# Patient Record
Sex: Female | Born: 1935 | Race: White | Hispanic: No | Smoking: Former smoker
Health system: Southern US, Community
[De-identification: ages and names within clinical notes are randomized; demographics above are authoritative.]

## PROBLEM LIST (undated history)

## (undated) DIAGNOSIS — I739 Peripheral vascular disease, unspecified: Secondary | ICD-10-CM

## (undated) DIAGNOSIS — G459 Transient cerebral ischemic attack, unspecified: Secondary | ICD-10-CM

## (undated) DIAGNOSIS — E039 Hypothyroidism, unspecified: Secondary | ICD-10-CM

## (undated) DIAGNOSIS — M1990 Unspecified osteoarthritis, unspecified site: Secondary | ICD-10-CM

## (undated) DIAGNOSIS — F32A Depression, unspecified: Secondary | ICD-10-CM

## (undated) DIAGNOSIS — F329 Major depressive disorder, single episode, unspecified: Secondary | ICD-10-CM

## (undated) DIAGNOSIS — F0390 Unspecified dementia without behavioral disturbance: Secondary | ICD-10-CM

## (undated) DIAGNOSIS — E46 Unspecified protein-calorie malnutrition: Secondary | ICD-10-CM

## (undated) DIAGNOSIS — N289 Disorder of kidney and ureter, unspecified: Secondary | ICD-10-CM

## (undated) DIAGNOSIS — I639 Cerebral infarction, unspecified: Secondary | ICD-10-CM

## (undated) DIAGNOSIS — D649 Anemia, unspecified: Secondary | ICD-10-CM

## (undated) DIAGNOSIS — E871 Hypo-osmolality and hyponatremia: Secondary | ICD-10-CM

## (undated) DIAGNOSIS — N390 Urinary tract infection, site not specified: Secondary | ICD-10-CM

## (undated) DIAGNOSIS — I10 Essential (primary) hypertension: Secondary | ICD-10-CM

## (undated) DIAGNOSIS — L308 Other specified dermatitis: Secondary | ICD-10-CM

## (undated) DIAGNOSIS — I509 Heart failure, unspecified: Secondary | ICD-10-CM

## (undated) DIAGNOSIS — A419 Sepsis, unspecified organism: Secondary | ICD-10-CM

## (undated) DIAGNOSIS — I4891 Unspecified atrial fibrillation: Secondary | ICD-10-CM

## (undated) DIAGNOSIS — A0472 Enterocolitis due to Clostridium difficile, not specified as recurrent: Secondary | ICD-10-CM

## (undated) DIAGNOSIS — F419 Anxiety disorder, unspecified: Secondary | ICD-10-CM

## (undated) HISTORY — DX: Major depressive disorder, single episode, unspecified: F32.9

## (undated) HISTORY — DX: Peripheral vascular disease, unspecified: I73.9

## (undated) HISTORY — DX: Anxiety disorder, unspecified: F41.9

## (undated) HISTORY — DX: Unspecified protein-calorie malnutrition: E46

## (undated) HISTORY — DX: Anemia, unspecified: D64.9

## (undated) HISTORY — DX: Enterocolitis due to Clostridium difficile, not specified as recurrent: A04.72

## (undated) HISTORY — DX: Urinary tract infection, site not specified: N39.0

## (undated) HISTORY — DX: Heart failure, unspecified: I50.9

## (undated) HISTORY — DX: Sepsis, unspecified organism: A41.9

## (undated) HISTORY — DX: Hypothyroidism, unspecified: E03.9

## (undated) HISTORY — DX: Unspecified osteoarthritis, unspecified site: M19.90

## (undated) HISTORY — PX: BLADDER SURGERY: SHX569

## (undated) HISTORY — DX: Transient cerebral ischemic attack, unspecified: G45.9

## (undated) HISTORY — DX: Depression, unspecified: F32.A

## (undated) HISTORY — PX: CATARACT EXTRACTION: SUR2

## (undated) HISTORY — DX: Hypo-osmolality and hyponatremia: E87.1

## (undated) HISTORY — DX: Other specified dermatitis: L30.8

## (undated) HISTORY — PX: INGUINAL HERNIA REPAIR: SUR1180

## (undated) HISTORY — DX: Cerebral infarction, unspecified: I63.9

## (undated) HISTORY — DX: Unspecified dementia, unspecified severity, without behavioral disturbance, psychotic disturbance, mood disturbance, and anxiety: F03.90

---

## 1998-01-23 ENCOUNTER — Encounter: Admission: RE | Admit: 1998-01-23 | Discharge: 1998-01-23 | Payer: Self-pay | Admitting: Internal Medicine

## 1998-02-18 ENCOUNTER — Encounter: Admission: RE | Admit: 1998-02-18 | Discharge: 1998-02-18 | Payer: Self-pay | Admitting: Hematology and Oncology

## 1998-03-26 ENCOUNTER — Encounter: Admission: RE | Admit: 1998-03-26 | Discharge: 1998-03-26 | Payer: Self-pay | Admitting: Hematology and Oncology

## 1998-04-08 ENCOUNTER — Encounter: Admission: RE | Admit: 1998-04-08 | Discharge: 1998-04-08 | Payer: Self-pay | Admitting: Hematology and Oncology

## 1998-06-10 ENCOUNTER — Encounter: Admission: RE | Admit: 1998-06-10 | Discharge: 1998-06-10 | Payer: Self-pay | Admitting: Internal Medicine

## 1998-07-14 ENCOUNTER — Encounter: Admission: RE | Admit: 1998-07-14 | Discharge: 1998-07-14 | Payer: Self-pay | Admitting: Internal Medicine

## 1998-08-10 ENCOUNTER — Encounter: Admission: RE | Admit: 1998-08-10 | Discharge: 1998-08-10 | Payer: Self-pay | Admitting: Internal Medicine

## 1998-09-01 ENCOUNTER — Ambulatory Visit: Admission: RE | Admit: 1998-09-01 | Discharge: 1998-09-01 | Payer: Self-pay

## 1998-09-18 ENCOUNTER — Encounter: Admission: RE | Admit: 1998-09-18 | Discharge: 1998-09-18 | Payer: Self-pay | Admitting: Internal Medicine

## 1998-10-08 ENCOUNTER — Encounter: Admission: RE | Admit: 1998-10-08 | Discharge: 1998-10-08 | Payer: Self-pay | Admitting: Internal Medicine

## 1998-12-01 ENCOUNTER — Encounter: Admission: RE | Admit: 1998-12-01 | Discharge: 1998-12-01 | Payer: Self-pay | Admitting: Internal Medicine

## 1998-12-29 ENCOUNTER — Encounter: Admission: RE | Admit: 1998-12-29 | Discharge: 1998-12-29 | Payer: Self-pay | Admitting: Internal Medicine

## 1999-01-26 ENCOUNTER — Encounter: Admission: RE | Admit: 1999-01-26 | Discharge: 1999-01-26 | Payer: Self-pay | Admitting: Internal Medicine

## 1999-03-16 ENCOUNTER — Encounter: Admission: RE | Admit: 1999-03-16 | Discharge: 1999-03-16 | Payer: Self-pay | Admitting: Internal Medicine

## 1999-04-01 ENCOUNTER — Encounter: Admission: RE | Admit: 1999-04-01 | Discharge: 1999-04-01 | Payer: Self-pay | Admitting: Internal Medicine

## 1999-06-14 ENCOUNTER — Encounter: Admission: RE | Admit: 1999-06-14 | Discharge: 1999-06-14 | Payer: Self-pay | Admitting: Internal Medicine

## 1999-07-12 ENCOUNTER — Encounter: Admission: RE | Admit: 1999-07-12 | Discharge: 1999-07-12 | Payer: Self-pay | Admitting: Internal Medicine

## 1999-08-16 ENCOUNTER — Encounter: Admission: RE | Admit: 1999-08-16 | Discharge: 1999-08-16 | Payer: Self-pay | Admitting: Internal Medicine

## 1999-09-13 ENCOUNTER — Encounter: Admission: RE | Admit: 1999-09-13 | Discharge: 1999-09-13 | Payer: Self-pay | Admitting: Internal Medicine

## 1999-11-01 ENCOUNTER — Encounter: Admission: RE | Admit: 1999-11-01 | Discharge: 1999-11-01 | Payer: Self-pay | Admitting: Internal Medicine

## 1999-12-08 ENCOUNTER — Encounter: Payer: Self-pay | Admitting: Internal Medicine

## 1999-12-08 ENCOUNTER — Encounter: Admission: RE | Admit: 1999-12-08 | Discharge: 1999-12-08 | Payer: Self-pay

## 1999-12-21 ENCOUNTER — Encounter: Admission: RE | Admit: 1999-12-21 | Discharge: 1999-12-21 | Payer: Self-pay | Admitting: Hematology and Oncology

## 2000-05-08 ENCOUNTER — Encounter: Admission: RE | Admit: 2000-05-08 | Discharge: 2000-05-08 | Payer: Self-pay | Admitting: Internal Medicine

## 2000-06-26 ENCOUNTER — Encounter: Admission: RE | Admit: 2000-06-26 | Discharge: 2000-06-26 | Payer: Self-pay | Admitting: Internal Medicine

## 2000-07-11 ENCOUNTER — Encounter: Admission: RE | Admit: 2000-07-11 | Discharge: 2000-07-11 | Payer: Self-pay | Admitting: Obstetrics & Gynecology

## 2000-07-11 ENCOUNTER — Encounter: Admission: RE | Admit: 2000-07-11 | Discharge: 2000-07-11 | Payer: Self-pay | Admitting: Internal Medicine

## 2000-08-21 ENCOUNTER — Encounter: Admission: RE | Admit: 2000-08-21 | Discharge: 2000-08-21 | Payer: Self-pay | Admitting: Internal Medicine

## 2000-11-20 ENCOUNTER — Encounter: Admission: RE | Admit: 2000-11-20 | Discharge: 2000-11-20 | Payer: Self-pay

## 2001-01-01 ENCOUNTER — Encounter: Admission: RE | Admit: 2001-01-01 | Discharge: 2001-01-01 | Payer: Self-pay

## 2001-03-13 ENCOUNTER — Encounter: Admission: RE | Admit: 2001-03-13 | Discharge: 2001-03-13 | Payer: Self-pay | Admitting: Internal Medicine

## 2001-06-07 ENCOUNTER — Encounter: Admission: RE | Admit: 2001-06-07 | Discharge: 2001-06-07 | Payer: Self-pay | Admitting: Internal Medicine

## 2001-07-20 ENCOUNTER — Emergency Department (HOSPITAL_COMMUNITY): Admission: EM | Admit: 2001-07-20 | Discharge: 2001-07-20 | Payer: Self-pay | Admitting: Emergency Medicine

## 2001-07-20 ENCOUNTER — Encounter: Payer: Self-pay | Admitting: Emergency Medicine

## 2001-07-23 ENCOUNTER — Encounter: Admission: RE | Admit: 2001-07-23 | Discharge: 2001-07-23 | Payer: Self-pay

## 2001-08-01 ENCOUNTER — Ambulatory Visit (HOSPITAL_COMMUNITY): Admission: RE | Admit: 2001-08-01 | Discharge: 2001-08-01 | Payer: Self-pay

## 2001-08-30 ENCOUNTER — Encounter: Admission: RE | Admit: 2001-08-30 | Discharge: 2001-08-30 | Payer: Self-pay | Admitting: Internal Medicine

## 2002-02-07 ENCOUNTER — Encounter: Admission: RE | Admit: 2002-02-07 | Discharge: 2002-02-07 | Payer: Self-pay | Admitting: Internal Medicine

## 2002-05-01 ENCOUNTER — Encounter: Admission: RE | Admit: 2002-05-01 | Discharge: 2002-05-01 | Payer: Self-pay | Admitting: Internal Medicine

## 2002-07-24 ENCOUNTER — Encounter: Admission: RE | Admit: 2002-07-24 | Discharge: 2002-07-24 | Payer: Self-pay | Admitting: Internal Medicine

## 2002-08-14 ENCOUNTER — Encounter: Admission: RE | Admit: 2002-08-14 | Discharge: 2002-08-14 | Payer: Self-pay | Admitting: Internal Medicine

## 2002-09-06 ENCOUNTER — Encounter: Admission: RE | Admit: 2002-09-06 | Discharge: 2002-09-06 | Payer: Self-pay | Admitting: Internal Medicine

## 2002-09-23 ENCOUNTER — Encounter: Admission: RE | Admit: 2002-09-23 | Discharge: 2002-09-23 | Payer: Self-pay | Admitting: Internal Medicine

## 2002-09-23 ENCOUNTER — Encounter: Payer: Self-pay | Admitting: Internal Medicine

## 2002-09-23 ENCOUNTER — Ambulatory Visit (HOSPITAL_COMMUNITY): Admission: RE | Admit: 2002-09-23 | Discharge: 2002-09-23 | Payer: Self-pay | Admitting: Internal Medicine

## 2002-11-21 ENCOUNTER — Encounter: Admission: RE | Admit: 2002-11-21 | Discharge: 2002-11-21 | Payer: Self-pay | Admitting: Internal Medicine

## 2003-01-20 ENCOUNTER — Encounter: Admission: RE | Admit: 2003-01-20 | Discharge: 2003-01-20 | Payer: Self-pay | Admitting: Internal Medicine

## 2003-02-18 ENCOUNTER — Encounter: Admission: RE | Admit: 2003-02-18 | Discharge: 2003-02-18 | Payer: Self-pay | Admitting: Internal Medicine

## 2003-05-19 ENCOUNTER — Encounter: Admission: RE | Admit: 2003-05-19 | Discharge: 2003-05-19 | Payer: Self-pay | Admitting: Internal Medicine

## 2003-08-28 ENCOUNTER — Encounter: Admission: RE | Admit: 2003-08-28 | Discharge: 2003-08-28 | Payer: Self-pay | Admitting: Internal Medicine

## 2003-11-24 ENCOUNTER — Encounter: Admission: RE | Admit: 2003-11-24 | Discharge: 2003-11-24 | Payer: Self-pay | Admitting: Internal Medicine

## 2003-12-12 ENCOUNTER — Encounter: Admission: RE | Admit: 2003-12-12 | Discharge: 2003-12-12 | Payer: Self-pay | Admitting: Internal Medicine

## 2003-12-22 ENCOUNTER — Encounter: Admission: RE | Admit: 2003-12-22 | Discharge: 2003-12-22 | Payer: Self-pay | Admitting: Internal Medicine

## 2004-01-05 ENCOUNTER — Encounter: Admission: RE | Admit: 2004-01-05 | Discharge: 2004-01-05 | Payer: Self-pay | Admitting: Internal Medicine

## 2004-02-09 ENCOUNTER — Encounter: Admission: RE | Admit: 2004-02-09 | Discharge: 2004-02-09 | Payer: Self-pay | Admitting: Internal Medicine

## 2004-03-19 ENCOUNTER — Ambulatory Visit (HOSPITAL_COMMUNITY): Admission: RE | Admit: 2004-03-19 | Discharge: 2004-03-19 | Payer: Self-pay | Admitting: Internal Medicine

## 2004-03-22 ENCOUNTER — Encounter: Admission: RE | Admit: 2004-03-22 | Discharge: 2004-03-22 | Payer: Self-pay | Admitting: Internal Medicine

## 2004-03-23 ENCOUNTER — Encounter: Admission: RE | Admit: 2004-03-23 | Discharge: 2004-03-23 | Payer: Self-pay | Admitting: Internal Medicine

## 2004-04-22 ENCOUNTER — Encounter: Admission: RE | Admit: 2004-04-22 | Discharge: 2004-04-22 | Payer: Self-pay | Admitting: Internal Medicine

## 2004-05-10 ENCOUNTER — Encounter: Admission: RE | Admit: 2004-05-10 | Discharge: 2004-05-10 | Payer: Self-pay | Admitting: Internal Medicine

## 2004-05-17 ENCOUNTER — Encounter: Admission: RE | Admit: 2004-05-17 | Discharge: 2004-05-17 | Payer: Self-pay | Admitting: Internal Medicine

## 2004-05-31 ENCOUNTER — Encounter: Admission: RE | Admit: 2004-05-31 | Discharge: 2004-05-31 | Payer: Self-pay | Admitting: Internal Medicine

## 2004-06-28 ENCOUNTER — Ambulatory Visit: Payer: Self-pay | Admitting: Internal Medicine

## 2004-07-26 ENCOUNTER — Ambulatory Visit: Payer: Self-pay | Admitting: Internal Medicine

## 2004-08-23 ENCOUNTER — Ambulatory Visit: Payer: Self-pay | Admitting: Internal Medicine

## 2004-08-31 ENCOUNTER — Ambulatory Visit: Payer: Self-pay | Admitting: Internal Medicine

## 2004-09-27 ENCOUNTER — Ambulatory Visit: Payer: Self-pay | Admitting: Internal Medicine

## 2004-10-25 ENCOUNTER — Ambulatory Visit: Payer: Self-pay | Admitting: Internal Medicine

## 2004-11-22 ENCOUNTER — Ambulatory Visit: Payer: Self-pay | Admitting: Internal Medicine

## 2004-12-20 ENCOUNTER — Ambulatory Visit: Payer: Self-pay | Admitting: Internal Medicine

## 2005-01-13 ENCOUNTER — Ambulatory Visit: Payer: Self-pay | Admitting: Internal Medicine

## 2005-02-02 ENCOUNTER — Ambulatory Visit: Payer: Self-pay | Admitting: Internal Medicine

## 2005-02-21 ENCOUNTER — Ambulatory Visit: Payer: Self-pay | Admitting: Internal Medicine

## 2005-03-21 ENCOUNTER — Ambulatory Visit: Payer: Self-pay | Admitting: Internal Medicine

## 2005-04-25 ENCOUNTER — Ambulatory Visit: Payer: Self-pay | Admitting: Internal Medicine

## 2005-05-23 ENCOUNTER — Ambulatory Visit (HOSPITAL_COMMUNITY): Admission: RE | Admit: 2005-05-23 | Discharge: 2005-05-23 | Payer: Self-pay | Admitting: Internal Medicine

## 2005-05-23 ENCOUNTER — Ambulatory Visit: Payer: Self-pay | Admitting: Internal Medicine

## 2005-05-31 ENCOUNTER — Ambulatory Visit: Payer: Self-pay | Admitting: Internal Medicine

## 2005-06-03 ENCOUNTER — Ambulatory Visit (HOSPITAL_COMMUNITY): Admission: RE | Admit: 2005-06-03 | Discharge: 2005-06-03 | Payer: Self-pay | Admitting: Internal Medicine

## 2005-06-27 ENCOUNTER — Ambulatory Visit: Payer: Self-pay | Admitting: Internal Medicine

## 2005-07-11 ENCOUNTER — Ambulatory Visit: Payer: Self-pay | Admitting: Internal Medicine

## 2005-07-15 ENCOUNTER — Ambulatory Visit: Payer: Self-pay | Admitting: Internal Medicine

## 2005-07-25 ENCOUNTER — Ambulatory Visit: Payer: Self-pay | Admitting: Hospitalist

## 2005-08-22 ENCOUNTER — Ambulatory Visit: Payer: Self-pay | Admitting: Internal Medicine

## 2005-09-07 ENCOUNTER — Ambulatory Visit: Payer: Self-pay | Admitting: Internal Medicine

## 2005-10-03 ENCOUNTER — Ambulatory Visit: Payer: Self-pay | Admitting: Internal Medicine

## 2005-10-31 ENCOUNTER — Ambulatory Visit: Payer: Self-pay | Admitting: Hospitalist

## 2005-11-28 ENCOUNTER — Ambulatory Visit: Payer: Self-pay | Admitting: Internal Medicine

## 2005-12-12 ENCOUNTER — Ambulatory Visit: Payer: Self-pay | Admitting: Internal Medicine

## 2005-12-19 ENCOUNTER — Ambulatory Visit: Payer: Self-pay | Admitting: Internal Medicine

## 2006-01-09 ENCOUNTER — Ambulatory Visit: Payer: Self-pay | Admitting: Internal Medicine

## 2006-02-08 ENCOUNTER — Ambulatory Visit: Payer: Self-pay | Admitting: Internal Medicine

## 2006-02-13 ENCOUNTER — Ambulatory Visit: Payer: Self-pay | Admitting: Hospitalist

## 2006-03-16 ENCOUNTER — Ambulatory Visit: Payer: Self-pay | Admitting: Internal Medicine

## 2006-04-17 ENCOUNTER — Ambulatory Visit: Payer: Self-pay | Admitting: Internal Medicine

## 2006-04-28 ENCOUNTER — Ambulatory Visit: Payer: Self-pay | Admitting: Internal Medicine

## 2006-05-15 ENCOUNTER — Ambulatory Visit: Payer: Self-pay | Admitting: Internal Medicine

## 2006-06-12 ENCOUNTER — Ambulatory Visit: Payer: Self-pay | Admitting: Hospitalist

## 2006-07-10 ENCOUNTER — Ambulatory Visit: Payer: Self-pay | Admitting: Internal Medicine

## 2006-08-07 ENCOUNTER — Ambulatory Visit: Payer: Self-pay | Admitting: Internal Medicine

## 2006-08-29 ENCOUNTER — Ambulatory Visit: Payer: Self-pay | Admitting: Hospitalist

## 2006-09-04 ENCOUNTER — Ambulatory Visit: Payer: Self-pay | Admitting: Hospitalist

## 2006-10-02 ENCOUNTER — Ambulatory Visit: Payer: Self-pay | Admitting: Internal Medicine

## 2006-10-28 DIAGNOSIS — F329 Major depressive disorder, single episode, unspecified: Secondary | ICD-10-CM

## 2006-10-28 DIAGNOSIS — Z8679 Personal history of other diseases of the circulatory system: Secondary | ICD-10-CM | POA: Insufficient documentation

## 2006-10-28 DIAGNOSIS — E039 Hypothyroidism, unspecified: Secondary | ICD-10-CM

## 2006-10-28 DIAGNOSIS — I4891 Unspecified atrial fibrillation: Secondary | ICD-10-CM | POA: Insufficient documentation

## 2006-10-28 DIAGNOSIS — M1990 Unspecified osteoarthritis, unspecified site: Secondary | ICD-10-CM | POA: Insufficient documentation

## 2006-10-28 DIAGNOSIS — F411 Generalized anxiety disorder: Secondary | ICD-10-CM | POA: Insufficient documentation

## 2006-10-28 DIAGNOSIS — I10 Essential (primary) hypertension: Secondary | ICD-10-CM | POA: Insufficient documentation

## 2006-10-30 ENCOUNTER — Ambulatory Visit: Payer: Self-pay | Admitting: Internal Medicine

## 2006-11-27 ENCOUNTER — Ambulatory Visit: Payer: Self-pay | Admitting: Hospitalist

## 2006-12-18 ENCOUNTER — Ambulatory Visit: Payer: Self-pay | Admitting: Internal Medicine

## 2006-12-19 ENCOUNTER — Telehealth (INDEPENDENT_AMBULATORY_CARE_PROVIDER_SITE_OTHER): Payer: Self-pay | Admitting: *Deleted

## 2007-01-09 ENCOUNTER — Ambulatory Visit: Payer: Self-pay | Admitting: Internal Medicine

## 2007-01-09 ENCOUNTER — Encounter: Payer: Self-pay | Admitting: Internal Medicine

## 2007-01-09 DIAGNOSIS — R32 Unspecified urinary incontinence: Secondary | ICD-10-CM

## 2007-01-09 LAB — CONVERTED CEMR LAB
Ketones, ur: NEGATIVE mg/dL
Leukocytes, UA: NEGATIVE
Urine Glucose: NEGATIVE mg/dL
Urobilinogen, UA: 0.2 (ref 0.0–1.0)

## 2007-01-15 ENCOUNTER — Ambulatory Visit: Payer: Self-pay | Admitting: Internal Medicine

## 2007-01-15 LAB — CONVERTED CEMR LAB: INR: 2.4

## 2007-01-17 ENCOUNTER — Telehealth: Payer: Self-pay | Admitting: *Deleted

## 2007-01-19 ENCOUNTER — Telehealth (INDEPENDENT_AMBULATORY_CARE_PROVIDER_SITE_OTHER): Payer: Self-pay | Admitting: *Deleted

## 2007-02-12 ENCOUNTER — Ambulatory Visit: Payer: Self-pay | Admitting: Hospitalist

## 2007-02-12 ENCOUNTER — Encounter: Payer: Self-pay | Admitting: Internal Medicine

## 2007-02-12 LAB — CONVERTED CEMR LAB
AST: 23 units/L (ref 0–37)
Basophils Relative: 1 % (ref 0–1)
CO2: 26 meq/L (ref 19–32)
Calcium: 9.3 mg/dL (ref 8.4–10.5)
Chloride: 104 meq/L (ref 96–112)
Creatinine, Ser: 0.84 mg/dL (ref 0.40–1.20)
Glucose, Bld: 85 mg/dL (ref 70–99)
HCT: 42 % (ref 36.0–46.0)
Hemoglobin: 13.7 g/dL (ref 12.0–15.0)
LDL Cholesterol: 114 mg/dL — ABNORMAL HIGH (ref 0–99)
MCHC: 32.6 g/dL (ref 30.0–36.0)
Neutrophils Relative %: 65 % (ref 43–77)
Platelets: 235 10*3/uL (ref 150–400)
RBC: 4.71 M/uL (ref 3.87–5.11)
Total Bilirubin: 0.6 mg/dL (ref 0.3–1.2)
Triglycerides: 78 mg/dL (ref <150)
VLDL: 16 mg/dL (ref 0–40)

## 2007-03-05 ENCOUNTER — Ambulatory Visit: Payer: Self-pay | Admitting: Hospitalist

## 2007-03-05 LAB — CONVERTED CEMR LAB: INR: 2.7

## 2007-04-09 ENCOUNTER — Encounter: Payer: Self-pay | Admitting: Pharmacist

## 2007-04-09 ENCOUNTER — Ambulatory Visit: Payer: Self-pay | Admitting: Internal Medicine

## 2007-04-09 LAB — CONVERTED CEMR LAB: INR: 4.7

## 2007-04-17 ENCOUNTER — Encounter: Payer: Self-pay | Admitting: Internal Medicine

## 2007-04-17 ENCOUNTER — Ambulatory Visit: Payer: Self-pay | Admitting: Internal Medicine

## 2007-04-17 DIAGNOSIS — R609 Edema, unspecified: Secondary | ICD-10-CM

## 2007-04-17 DIAGNOSIS — N390 Urinary tract infection, site not specified: Secondary | ICD-10-CM

## 2007-04-17 DIAGNOSIS — R634 Abnormal weight loss: Secondary | ICD-10-CM

## 2007-04-18 ENCOUNTER — Telehealth: Payer: Self-pay | Admitting: Internal Medicine

## 2007-04-18 LAB — CONVERTED CEMR LAB
Bilirubin Urine: NEGATIVE
Calcium: 10 mg/dL (ref 8.4–10.5)
Potassium: 4 meq/L (ref 3.5–5.3)
Protein, ur: NEGATIVE mg/dL
Urobilinogen, UA: 0.2 (ref 0.0–1.0)
pH: 6.5 (ref 5.0–8.0)

## 2007-04-23 ENCOUNTER — Ambulatory Visit: Payer: Self-pay | Admitting: Hospitalist

## 2007-04-23 ENCOUNTER — Telehealth: Payer: Self-pay | Admitting: *Deleted

## 2007-04-25 ENCOUNTER — Encounter: Payer: Self-pay | Admitting: Internal Medicine

## 2007-04-25 LAB — CONVERTED CEMR LAB
Ketones, ur: NEGATIVE mg/dL
Nitrite: NEGATIVE
Specific Gravity, Urine: 1.011 (ref 1.005–1.03)
pH: 7.5 (ref 5.0–8.0)

## 2007-05-07 ENCOUNTER — Ambulatory Visit: Payer: Self-pay | Admitting: *Deleted

## 2007-05-07 ENCOUNTER — Encounter: Payer: Self-pay | Admitting: Internal Medicine

## 2007-05-07 LAB — CONVERTED CEMR LAB
Glucose, Urine, Semiquant: NEGATIVE
Ketones, urine, test strip: NEGATIVE
Protein, U semiquant: NEGATIVE
Specific Gravity, Urine: 1.015
Urobilinogen, UA: 0.2

## 2007-05-09 ENCOUNTER — Telehealth: Payer: Self-pay | Admitting: *Deleted

## 2007-06-04 ENCOUNTER — Ambulatory Visit: Payer: Self-pay | Admitting: *Deleted

## 2007-06-05 ENCOUNTER — Telehealth: Payer: Self-pay | Admitting: *Deleted

## 2007-07-05 ENCOUNTER — Encounter: Payer: Self-pay | Admitting: Internal Medicine

## 2007-07-05 ENCOUNTER — Ambulatory Visit: Payer: Self-pay | Admitting: Hospitalist

## 2007-07-05 DIAGNOSIS — I872 Venous insufficiency (chronic) (peripheral): Secondary | ICD-10-CM | POA: Insufficient documentation

## 2007-07-05 LAB — CONVERTED CEMR LAB
INR: 2.5
Ketones, ur: NEGATIVE mg/dL
Protein, ur: NEGATIVE mg/dL
Urine Glucose: NEGATIVE mg/dL
pH: 6 (ref 5.0–8.0)

## 2007-07-06 ENCOUNTER — Encounter: Payer: Self-pay | Admitting: Internal Medicine

## 2007-07-30 ENCOUNTER — Ambulatory Visit: Payer: Self-pay | Admitting: Infectious Diseases

## 2007-07-30 ENCOUNTER — Telehealth: Payer: Self-pay | Admitting: *Deleted

## 2007-08-24 ENCOUNTER — Telehealth: Payer: Self-pay | Admitting: *Deleted

## 2007-08-27 ENCOUNTER — Ambulatory Visit: Payer: Self-pay | Admitting: Internal Medicine

## 2007-08-27 ENCOUNTER — Encounter (INDEPENDENT_AMBULATORY_CARE_PROVIDER_SITE_OTHER): Payer: Self-pay | Admitting: *Deleted

## 2007-08-27 ENCOUNTER — Ambulatory Visit (HOSPITAL_COMMUNITY): Admission: RE | Admit: 2007-08-27 | Discharge: 2007-08-27 | Payer: Self-pay | Admitting: *Deleted

## 2007-08-27 ENCOUNTER — Ambulatory Visit: Payer: Self-pay | Admitting: *Deleted

## 2007-08-27 ENCOUNTER — Encounter: Payer: Self-pay | Admitting: Internal Medicine

## 2007-08-27 ENCOUNTER — Encounter (INDEPENDENT_AMBULATORY_CARE_PROVIDER_SITE_OTHER): Payer: Self-pay | Admitting: Internal Medicine

## 2007-08-27 LAB — CONVERTED CEMR LAB: INR: 1.9

## 2007-09-04 ENCOUNTER — Ambulatory Visit: Payer: Self-pay | Admitting: *Deleted

## 2007-09-04 ENCOUNTER — Encounter (INDEPENDENT_AMBULATORY_CARE_PROVIDER_SITE_OTHER): Payer: Self-pay | Admitting: *Deleted

## 2007-09-04 ENCOUNTER — Ambulatory Visit (HOSPITAL_COMMUNITY): Admission: RE | Admit: 2007-09-04 | Discharge: 2007-09-04 | Payer: Self-pay | Admitting: Internal Medicine

## 2007-09-11 ENCOUNTER — Encounter: Payer: Self-pay | Admitting: Pharmacist

## 2007-09-11 ENCOUNTER — Encounter: Payer: Self-pay | Admitting: Internal Medicine

## 2007-09-11 ENCOUNTER — Ambulatory Visit: Payer: Self-pay | Admitting: Hospitalist

## 2007-09-11 LAB — CONVERTED CEMR LAB
ALT: 15 units/L (ref 0–35)
AST: 21 units/L (ref 0–37)
Alkaline Phosphatase: 59 units/L (ref 39–117)
BUN: 21 mg/dL (ref 6–23)
CO2: 24 meq/L (ref 19–32)
Calcium: 10.1 mg/dL (ref 8.4–10.5)
Chloride: 104 meq/L (ref 96–112)
Eosinophils Absolute: 0.2 10*3/uL (ref 0.2–0.7)
Eosinophils Relative: 3 % (ref 0–5)
HCT: 38.7 % (ref 36.0–46.0)
Hemoglobin: 12.5 g/dL (ref 12.0–15.0)
MCHC: 32.3 g/dL (ref 30.0–36.0)
MCV: 88.2 fL (ref 78.0–100.0)
Monocytes Absolute: 0.7 10*3/uL (ref 0.1–1.0)
Platelets: 236 10*3/uL (ref 150–400)
RDW: 14.1 % (ref 11.5–15.5)
Sodium: 142 meq/L (ref 135–145)

## 2007-09-18 ENCOUNTER — Ambulatory Visit: Payer: Self-pay | Admitting: Internal Medicine

## 2007-09-18 LAB — CONVERTED CEMR LAB: INR: 2.7

## 2007-09-24 ENCOUNTER — Telehealth: Payer: Self-pay | Admitting: Internal Medicine

## 2007-10-01 ENCOUNTER — Telehealth: Payer: Self-pay | Admitting: *Deleted

## 2007-10-15 ENCOUNTER — Ambulatory Visit: Payer: Self-pay | Admitting: Internal Medicine

## 2007-10-15 LAB — CONVERTED CEMR LAB: INR: 3

## 2007-10-24 ENCOUNTER — Ambulatory Visit (HOSPITAL_COMMUNITY): Admission: RE | Admit: 2007-10-24 | Discharge: 2007-10-24 | Payer: Self-pay | Admitting: Internal Medicine

## 2007-10-24 ENCOUNTER — Ambulatory Visit: Payer: Self-pay | Admitting: Vascular Surgery

## 2007-10-24 ENCOUNTER — Encounter (INDEPENDENT_AMBULATORY_CARE_PROVIDER_SITE_OTHER): Payer: Self-pay | Admitting: Internal Medicine

## 2007-10-24 ENCOUNTER — Ambulatory Visit: Payer: Self-pay | Admitting: Internal Medicine

## 2007-10-24 ENCOUNTER — Encounter: Payer: Self-pay | Admitting: Internal Medicine

## 2007-10-24 ENCOUNTER — Telehealth: Payer: Self-pay | Admitting: *Deleted

## 2007-10-24 LAB — CONVERTED CEMR LAB
BUN: 13 mg/dL (ref 6–23)
CO2: 26 meq/L (ref 19–32)
Glucose, Bld: 99 mg/dL (ref 70–99)
HCT: 38.1 % (ref 36.0–46.0)
Hemoglobin: 12.5 g/dL (ref 12.0–15.0)
INR: 2.3
Lymphs Abs: 1.2 10*3/uL (ref 0.7–4.0)
MCHC: 32.8 g/dL (ref 30.0–36.0)
MCV: 88.4 fL (ref 78.0–100.0)
Monocytes Absolute: 0.5 10*3/uL (ref 0.1–1.0)
Neutrophils Relative %: 70 % (ref 43–77)
RBC: 4.31 M/uL (ref 3.87–5.11)
RDW: 13.8 % (ref 11.5–15.5)
Sodium: 141 meq/L (ref 135–145)

## 2007-10-26 DIAGNOSIS — I739 Peripheral vascular disease, unspecified: Secondary | ICD-10-CM

## 2007-10-29 ENCOUNTER — Ambulatory Visit: Payer: Self-pay | Admitting: Internal Medicine

## 2007-10-29 LAB — CONVERTED CEMR LAB

## 2007-10-31 ENCOUNTER — Encounter (INDEPENDENT_AMBULATORY_CARE_PROVIDER_SITE_OTHER): Payer: Self-pay | Admitting: Internal Medicine

## 2007-10-31 ENCOUNTER — Ambulatory Visit: Payer: Self-pay | Admitting: Internal Medicine

## 2007-11-01 DIAGNOSIS — N189 Chronic kidney disease, unspecified: Secondary | ICD-10-CM | POA: Insufficient documentation

## 2007-11-01 LAB — CONVERTED CEMR LAB
Basophils Absolute: 0 10*3/uL (ref 0.0–0.1)
Basophils Relative: 1 % (ref 0–1)
CO2: 25 meq/L (ref 19–32)
Calcium: 9.8 mg/dL (ref 8.4–10.5)
Chloride: 100 meq/L (ref 96–112)
Creatinine, Ser: 1.97 mg/dL — ABNORMAL HIGH (ref 0.40–1.20)
Glucose, Bld: 128 mg/dL — ABNORMAL HIGH (ref 70–99)
HCT: 34.8 % — ABNORMAL LOW (ref 36.0–46.0)
Lymphocytes Relative: 31 % (ref 12–46)
Lymphs Abs: 1.2 10*3/uL (ref 0.7–4.0)
MCV: 85.7 fL (ref 78.0–100.0)
Monocytes Absolute: 0.4 10*3/uL (ref 0.1–1.0)
Monocytes Relative: 10 % (ref 3–12)
Platelets: 242 10*3/uL (ref 150–400)
Potassium: 4.3 meq/L (ref 3.5–5.3)
Sodium: 136 meq/L (ref 135–145)
WBC: 3.7 10*3/uL — ABNORMAL LOW (ref 4.0–10.5)

## 2007-11-05 ENCOUNTER — Ambulatory Visit: Payer: Self-pay | Admitting: Internal Medicine

## 2007-11-05 ENCOUNTER — Encounter (INDEPENDENT_AMBULATORY_CARE_PROVIDER_SITE_OTHER): Payer: Self-pay | Admitting: Internal Medicine

## 2007-11-05 LAB — CONVERTED CEMR LAB: INR: 5.4

## 2007-11-06 LAB — CONVERTED CEMR LAB
BUN: 19 mg/dL (ref 6–23)
Chloride: 98 meq/L (ref 96–112)
Glucose, Bld: 99 mg/dL (ref 70–99)
Potassium: 4.7 meq/L (ref 3.5–5.3)
Sodium: 132 meq/L — ABNORMAL LOW (ref 135–145)

## 2007-11-12 ENCOUNTER — Ambulatory Visit: Payer: Self-pay | Admitting: Internal Medicine

## 2007-11-12 ENCOUNTER — Ambulatory Visit: Payer: Self-pay | Admitting: Surgery

## 2007-11-12 ENCOUNTER — Encounter (INDEPENDENT_AMBULATORY_CARE_PROVIDER_SITE_OTHER): Payer: Self-pay | Admitting: Internal Medicine

## 2007-11-15 ENCOUNTER — Telehealth (INDEPENDENT_AMBULATORY_CARE_PROVIDER_SITE_OTHER): Payer: Self-pay | Admitting: Internal Medicine

## 2007-11-19 ENCOUNTER — Ambulatory Visit: Payer: Self-pay | Admitting: Internal Medicine

## 2007-11-19 ENCOUNTER — Ambulatory Visit: Payer: Self-pay | Admitting: Hospitalist

## 2007-11-19 ENCOUNTER — Ambulatory Visit: Payer: Self-pay | Admitting: Vascular Surgery

## 2007-11-26 ENCOUNTER — Ambulatory Visit: Payer: Self-pay | Admitting: Vascular Surgery

## 2007-11-28 ENCOUNTER — Ambulatory Visit: Payer: Self-pay | Admitting: Vascular Surgery

## 2007-12-04 ENCOUNTER — Ambulatory Visit: Payer: Self-pay | Admitting: Vascular Surgery

## 2007-12-04 ENCOUNTER — Encounter: Payer: Self-pay | Admitting: Pharmacist

## 2007-12-13 ENCOUNTER — Ambulatory Visit: Payer: Self-pay | Admitting: Internal Medicine

## 2007-12-24 ENCOUNTER — Ambulatory Visit: Payer: Self-pay | Admitting: Infectious Diseases

## 2007-12-24 LAB — CONVERTED CEMR LAB: INR: 2.4

## 2008-01-28 ENCOUNTER — Ambulatory Visit: Payer: Self-pay | Admitting: Internal Medicine

## 2008-01-28 ENCOUNTER — Encounter: Payer: Self-pay | Admitting: Pharmacist

## 2008-01-28 ENCOUNTER — Encounter: Payer: Self-pay | Admitting: Internal Medicine

## 2008-01-28 LAB — CONVERTED CEMR LAB
BUN: 22 mg/dL (ref 6–23)
Bilirubin Urine: NEGATIVE
Creatinine, Ser: 0.84 mg/dL (ref 0.40–1.20)
Creatinine, Urine: 17.6 mg/dL
Nitrite: NEGATIVE
Protein, ur: NEGATIVE mg/dL
Sodium, Ur: 118 meq/L
Specific Gravity, Urine: 1.009 (ref 1.005–1.03)
Urine Glucose: NEGATIVE mg/dL
Urobilinogen, UA: 0.2 (ref 0.0–1.0)

## 2008-03-13 ENCOUNTER — Ambulatory Visit: Payer: Self-pay | Admitting: *Deleted

## 2008-03-13 ENCOUNTER — Encounter: Payer: Self-pay | Admitting: Internal Medicine

## 2008-03-13 ENCOUNTER — Ambulatory Visit: Payer: Self-pay | Admitting: Internal Medicine

## 2008-03-13 LAB — CONVERTED CEMR LAB: INR: 2.1

## 2008-03-17 DIAGNOSIS — D649 Anemia, unspecified: Secondary | ICD-10-CM

## 2008-03-17 LAB — CONVERTED CEMR LAB
Albumin: 4.4 g/dL (ref 3.5–5.2)
BUN: 23 mg/dL (ref 6–23)
CO2: 27 meq/L (ref 19–32)
Eosinophils Relative: 2 % (ref 0–5)
Free T4: 1.36 ng/dL (ref 0.89–1.80)
Glucose, Bld: 95 mg/dL (ref 70–99)
HCT: 36.5 % (ref 36.0–46.0)
Lymphocytes Relative: 22 % (ref 12–46)
Lymphs Abs: 1.3 10*3/uL (ref 0.7–4.0)
Monocytes Relative: 9 % (ref 3–12)
Platelets: 214 10*3/uL (ref 150–400)
Potassium: 4 meq/L (ref 3.5–5.3)
RBC: 4.16 M/uL (ref 3.87–5.11)
Sodium: 141 meq/L (ref 135–145)
TSH: 2.39 microintl units/mL (ref 0.350–5.50)
Total Bilirubin: 0.4 mg/dL (ref 0.3–1.2)
Total Protein: 7.3 g/dL (ref 6.0–8.3)
WBC: 5.7 10*3/uL (ref 4.0–10.5)

## 2008-03-27 ENCOUNTER — Encounter: Payer: Self-pay | Admitting: Internal Medicine

## 2008-03-27 ENCOUNTER — Ambulatory Visit: Payer: Self-pay | Admitting: Internal Medicine

## 2008-03-27 ENCOUNTER — Encounter (INDEPENDENT_AMBULATORY_CARE_PROVIDER_SITE_OTHER): Payer: Self-pay | Admitting: *Deleted

## 2008-03-27 LAB — CONVERTED CEMR LAB
Iron: 53 ug/dL (ref 42–145)
RBC Folate: 1404 ng/mL — ABNORMAL HIGH (ref 180–600)

## 2008-03-31 ENCOUNTER — Telehealth (INDEPENDENT_AMBULATORY_CARE_PROVIDER_SITE_OTHER): Payer: Self-pay | Admitting: Pharmacist

## 2008-04-21 ENCOUNTER — Ambulatory Visit: Payer: Self-pay | Admitting: *Deleted

## 2008-04-21 LAB — CONVERTED CEMR LAB

## 2008-04-23 ENCOUNTER — Ambulatory Visit: Payer: Self-pay | Admitting: Infectious Diseases

## 2008-04-23 DIAGNOSIS — L738 Other specified follicular disorders: Secondary | ICD-10-CM | POA: Insufficient documentation

## 2008-04-29 ENCOUNTER — Telehealth: Payer: Self-pay | Admitting: Internal Medicine

## 2008-05-19 ENCOUNTER — Ambulatory Visit: Payer: Self-pay | Admitting: Infectious Diseases

## 2008-05-19 LAB — CONVERTED CEMR LAB: INR: 2

## 2008-07-07 ENCOUNTER — Ambulatory Visit: Payer: Self-pay | Admitting: Internal Medicine

## 2008-07-09 ENCOUNTER — Telehealth: Payer: Self-pay | Admitting: Internal Medicine

## 2008-08-04 ENCOUNTER — Ambulatory Visit: Payer: Self-pay | Admitting: Internal Medicine

## 2008-08-27 ENCOUNTER — Telehealth: Payer: Self-pay | Admitting: Internal Medicine

## 2008-09-01 ENCOUNTER — Ambulatory Visit: Payer: Self-pay | Admitting: *Deleted

## 2008-09-25 ENCOUNTER — Encounter: Payer: Self-pay | Admitting: Internal Medicine

## 2008-09-25 ENCOUNTER — Ambulatory Visit: Payer: Self-pay | Admitting: Internal Medicine

## 2008-09-25 LAB — CONVERTED CEMR LAB
Albumin: 4.4 g/dL (ref 3.5–5.2)
Alkaline Phosphatase: 54 units/L (ref 39–117)
BUN: 19 mg/dL (ref 6–23)
Eosinophils Absolute: 0.1 10*3/uL (ref 0.0–0.7)
Eosinophils Relative: 1 % (ref 0–5)
Glucose, Bld: 96 mg/dL (ref 70–99)
HCT: 35.9 % — ABNORMAL LOW (ref 36.0–46.0)
HDL: 52 mg/dL (ref >39)
Hemoglobin: 11.9 g/dL — ABNORMAL LOW (ref 12.0–15.0)
LDL Cholesterol: 91 mg/dL (ref 0–99)
Lymphs Abs: 1.1 10*3/uL (ref 0.7–4.0)
MCV: 88.9 fL (ref 78.0–100.0)
Monocytes Absolute: 0.7 10*3/uL (ref 0.1–1.0)
Monocytes Relative: 8 % (ref 3–12)
Neutrophils Relative %: 76 % (ref 43–77)
Potassium: 4.4 meq/L (ref 3.5–5.3)
RBC: 4.04 M/uL (ref 3.87–5.11)
Triglycerides: 60 mg/dL (ref <150)
WBC: 7.8 10*3/uL (ref 4.0–10.5)

## 2008-09-29 ENCOUNTER — Ambulatory Visit: Payer: Self-pay | Admitting: Internal Medicine

## 2008-10-27 ENCOUNTER — Ambulatory Visit: Payer: Self-pay | Admitting: Internal Medicine

## 2008-10-27 ENCOUNTER — Inpatient Hospital Stay (HOSPITAL_COMMUNITY): Admission: AD | Admit: 2008-10-27 | Discharge: 2008-10-28 | Payer: Self-pay | Admitting: *Deleted

## 2008-10-27 ENCOUNTER — Ambulatory Visit: Payer: Self-pay | Admitting: *Deleted

## 2008-10-27 DIAGNOSIS — K4090 Unilateral inguinal hernia, without obstruction or gangrene, not specified as recurrent: Secondary | ICD-10-CM | POA: Insufficient documentation

## 2008-10-27 LAB — CONVERTED CEMR LAB: INR: 2.1

## 2008-10-29 ENCOUNTER — Encounter: Payer: Self-pay | Admitting: Internal Medicine

## 2008-10-30 ENCOUNTER — Ambulatory Visit: Payer: Self-pay | Admitting: Internal Medicine

## 2008-10-30 LAB — CONVERTED CEMR LAB

## 2008-11-18 ENCOUNTER — Telehealth: Payer: Self-pay | Admitting: *Deleted

## 2008-11-20 ENCOUNTER — Ambulatory Visit: Payer: Self-pay | Admitting: *Deleted

## 2008-11-20 LAB — CONVERTED CEMR LAB: INR: 2.4

## 2008-12-16 ENCOUNTER — Telehealth: Payer: Self-pay | Admitting: Internal Medicine

## 2008-12-22 ENCOUNTER — Ambulatory Visit: Payer: Self-pay | Admitting: *Deleted

## 2008-12-22 ENCOUNTER — Encounter: Payer: Self-pay | Admitting: Internal Medicine

## 2008-12-22 DIAGNOSIS — M1710 Unilateral primary osteoarthritis, unspecified knee: Secondary | ICD-10-CM

## 2008-12-22 LAB — CONVERTED CEMR LAB
Bilirubin Urine: NEGATIVE
Hemoglobin, Urine: NEGATIVE
Ketones, ur: NEGATIVE mg/dL
Protein, ur: NEGATIVE mg/dL
Urobilinogen, UA: 0.2 (ref 0.0–1.0)

## 2008-12-23 ENCOUNTER — Encounter: Payer: Self-pay | Admitting: Internal Medicine

## 2009-01-12 ENCOUNTER — Telehealth (INDEPENDENT_AMBULATORY_CARE_PROVIDER_SITE_OTHER): Payer: Self-pay | Admitting: Pharmacist

## 2009-02-09 ENCOUNTER — Ambulatory Visit: Payer: Self-pay | Admitting: Internal Medicine

## 2009-02-09 LAB — CONVERTED CEMR LAB: INR: 2.3

## 2009-03-03 ENCOUNTER — Ambulatory Visit: Payer: Self-pay | Admitting: *Deleted

## 2009-03-03 LAB — CONVERTED CEMR LAB: INR: 2.2

## 2009-03-12 ENCOUNTER — Encounter: Payer: Self-pay | Admitting: Internal Medicine

## 2009-03-13 ENCOUNTER — Telehealth: Payer: Self-pay | Admitting: Internal Medicine

## 2009-03-25 ENCOUNTER — Encounter: Payer: Self-pay | Admitting: Cardiology

## 2009-03-25 ENCOUNTER — Ambulatory Visit: Payer: Self-pay | Admitting: Internal Medicine

## 2009-03-25 LAB — CONVERTED CEMR LAB

## 2009-04-27 ENCOUNTER — Ambulatory Visit: Payer: Self-pay | Admitting: Internal Medicine

## 2009-05-04 ENCOUNTER — Telehealth: Payer: Self-pay | Admitting: Internal Medicine

## 2009-05-05 ENCOUNTER — Ambulatory Visit: Payer: Self-pay | Admitting: Cardiology

## 2009-05-11 ENCOUNTER — Telehealth (INDEPENDENT_AMBULATORY_CARE_PROVIDER_SITE_OTHER): Payer: Self-pay | Admitting: *Deleted

## 2009-05-12 ENCOUNTER — Encounter: Payer: Self-pay | Admitting: Cardiology

## 2009-05-12 ENCOUNTER — Ambulatory Visit: Payer: Self-pay

## 2009-05-12 ENCOUNTER — Encounter: Payer: Self-pay | Admitting: Internal Medicine

## 2009-05-19 ENCOUNTER — Telehealth: Payer: Self-pay | Admitting: Internal Medicine

## 2009-05-21 ENCOUNTER — Encounter: Payer: Self-pay | Admitting: Internal Medicine

## 2009-05-25 ENCOUNTER — Ambulatory Visit: Payer: Self-pay | Admitting: Internal Medicine

## 2009-06-01 ENCOUNTER — Telehealth: Payer: Self-pay | Admitting: Internal Medicine

## 2009-06-03 ENCOUNTER — Ambulatory Visit: Payer: Self-pay | Admitting: Internal Medicine

## 2009-06-04 LAB — CONVERTED CEMR LAB
BUN: 14 mg/dL (ref 6–23)
Bilirubin Urine: NEGATIVE
Chloride: 104 meq/L (ref 96–112)
Glucose, Bld: 90 mg/dL (ref 70–99)
Hemoglobin, Urine: NEGATIVE
Potassium: 4 meq/L (ref 3.5–5.3)
Protein, ur: NEGATIVE mg/dL
RBC / HPF: NONE SEEN (ref <3)
Urine Glucose: NEGATIVE mg/dL
pH: 6.5 (ref 5.0–8.0)

## 2009-06-11 ENCOUNTER — Encounter: Payer: Self-pay | Admitting: Internal Medicine

## 2009-06-30 ENCOUNTER — Ambulatory Visit: Payer: Self-pay | Admitting: Infectious Diseases

## 2009-06-30 LAB — CONVERTED CEMR LAB

## 2009-07-20 ENCOUNTER — Ambulatory Visit: Payer: Self-pay | Admitting: Internal Medicine

## 2009-07-20 LAB — CONVERTED CEMR LAB: INR: 3.5

## 2009-07-23 ENCOUNTER — Telehealth: Payer: Self-pay | Admitting: *Deleted

## 2009-07-27 ENCOUNTER — Telehealth: Payer: Self-pay | Admitting: *Deleted

## 2009-07-30 ENCOUNTER — Telehealth: Payer: Self-pay | Admitting: Internal Medicine

## 2009-08-17 ENCOUNTER — Ambulatory Visit: Payer: Self-pay | Admitting: Internal Medicine

## 2009-08-17 LAB — CONVERTED CEMR LAB: INR: 1.6

## 2009-08-25 ENCOUNTER — Telehealth: Payer: Self-pay | Admitting: *Deleted

## 2009-08-28 ENCOUNTER — Ambulatory Visit (HOSPITAL_COMMUNITY): Admission: RE | Admit: 2009-08-28 | Discharge: 2009-08-29 | Payer: Self-pay | Admitting: General Surgery

## 2009-09-14 ENCOUNTER — Telehealth (INDEPENDENT_AMBULATORY_CARE_PROVIDER_SITE_OTHER): Payer: Self-pay | Admitting: Pharmacist

## 2009-09-28 ENCOUNTER — Ambulatory Visit: Payer: Self-pay | Admitting: Internal Medicine

## 2009-09-28 LAB — CONVERTED CEMR LAB: INR: 2.4

## 2009-11-11 ENCOUNTER — Telehealth: Payer: Self-pay | Admitting: Internal Medicine

## 2009-12-03 ENCOUNTER — Ambulatory Visit: Payer: Self-pay | Admitting: Internal Medicine

## 2009-12-03 ENCOUNTER — Ambulatory Visit (HOSPITAL_COMMUNITY): Admission: RE | Admit: 2009-12-03 | Discharge: 2009-12-03 | Payer: Self-pay | Admitting: Internal Medicine

## 2009-12-03 ENCOUNTER — Encounter: Payer: Self-pay | Admitting: Internal Medicine

## 2009-12-03 DIAGNOSIS — M25569 Pain in unspecified knee: Secondary | ICD-10-CM

## 2009-12-03 LAB — CONVERTED CEMR LAB
Crystals, Fluid: NONE SEEN
INR: 1.8
Monocyte/Macrophage: 10 % — ABNORMAL LOW (ref 50–90)

## 2009-12-04 LAB — CONVERTED CEMR LAB
ALT: 13 units/L (ref 0–35)
AST: 18 units/L (ref 0–37)
Albumin: 4.2 g/dL (ref 3.5–5.2)
CO2: 24 meq/L (ref 19–32)
Calcium: 10 mg/dL (ref 8.4–10.5)
Chloride: 105 meq/L (ref 96–112)
Eosinophils Absolute: 0.1 10*3/uL (ref 0.0–0.7)
Lymphocytes Relative: 21 % (ref 12–46)
Lymphs Abs: 1.1 10*3/uL (ref 0.7–4.0)
MCV: 89.8 fL (ref >=78.0)
Monocytes Relative: 10 % (ref 3–12)
Neutro Abs: 3.4 10*3/uL (ref 1.7–7.7)
Neutrophils Relative %: 66 % (ref 43–77)
Platelets: 222 10*3/uL (ref 150–400)
Potassium: 3.9 meq/L (ref 3.5–5.3)
RBC: 4.21 M/uL (ref 3.87–5.11)
Sodium: 140 meq/L (ref 135–145)
TSH: 13.628 microintl units/mL — ABNORMAL HIGH (ref 0.350–4.5)
Total Protein: 6.7 g/dL (ref 6.0–8.3)
WBC: 5.2 10*3/uL (ref 4.0–10.5)

## 2010-01-21 ENCOUNTER — Ambulatory Visit: Payer: Self-pay | Admitting: Internal Medicine

## 2010-01-21 ENCOUNTER — Telehealth: Payer: Self-pay | Admitting: Internal Medicine

## 2010-01-21 LAB — CONVERTED CEMR LAB
Hemoglobin, Urine: NEGATIVE
Protein, ur: NEGATIVE mg/dL
RBC / HPF: NONE SEEN (ref <3)
Squamous Epithelial / LPF: NONE SEEN /lpf
Urine Glucose: NEGATIVE mg/dL
Urobilinogen, UA: 0.2 (ref 0.0–1.0)

## 2010-02-19 ENCOUNTER — Telehealth: Payer: Self-pay | Admitting: Internal Medicine

## 2010-02-19 DIAGNOSIS — M79609 Pain in unspecified limb: Secondary | ICD-10-CM

## 2010-03-18 ENCOUNTER — Telehealth: Payer: Self-pay | Admitting: Internal Medicine

## 2010-05-20 ENCOUNTER — Ambulatory Visit: Payer: Self-pay | Admitting: Internal Medicine

## 2010-05-20 DIAGNOSIS — K5900 Constipation, unspecified: Secondary | ICD-10-CM | POA: Insufficient documentation

## 2010-05-21 ENCOUNTER — Telehealth: Payer: Self-pay | Admitting: Internal Medicine

## 2010-05-21 LAB — CONVERTED CEMR LAB
Bilirubin Urine: NEGATIVE
Crystals: NONE SEEN
Protein, ur: NEGATIVE mg/dL
Specific Gravity, Urine: 1.016 (ref 1.005–1.0)
Urobilinogen, UA: 0.2 (ref 0.0–1.0)

## 2010-05-25 ENCOUNTER — Telehealth: Payer: Self-pay | Admitting: *Deleted

## 2010-05-25 ENCOUNTER — Encounter: Payer: Self-pay | Admitting: Licensed Clinical Social Worker

## 2010-06-07 ENCOUNTER — Telehealth: Payer: Self-pay | Admitting: Internal Medicine

## 2010-06-08 ENCOUNTER — Telehealth: Payer: Self-pay | Admitting: Internal Medicine

## 2010-06-10 ENCOUNTER — Telehealth: Payer: Self-pay | Admitting: Licensed Clinical Social Worker

## 2010-06-10 ENCOUNTER — Encounter: Payer: Self-pay | Admitting: Internal Medicine

## 2010-06-11 ENCOUNTER — Encounter: Payer: Self-pay | Admitting: Licensed Clinical Social Worker

## 2010-06-11 ENCOUNTER — Telehealth: Payer: Self-pay | Admitting: Internal Medicine

## 2010-07-01 ENCOUNTER — Telehealth: Payer: Self-pay | Admitting: Licensed Clinical Social Worker

## 2010-07-05 ENCOUNTER — Telehealth: Payer: Self-pay | Admitting: Internal Medicine

## 2010-07-15 ENCOUNTER — Ambulatory Visit: Payer: Self-pay | Admitting: Internal Medicine

## 2010-07-15 ENCOUNTER — Telehealth: Payer: Self-pay | Admitting: Internal Medicine

## 2010-07-15 LAB — CONVERTED CEMR LAB
Blood in Urine, dipstick: NEGATIVE
Glucose, Urine, Semiquant: NEGATIVE
Ketones, urine, test strip: NEGATIVE
Nitrite: NEGATIVE
Protein, U semiquant: NEGATIVE

## 2010-11-04 ENCOUNTER — Ambulatory Visit: Admit: 2010-11-04 | Payer: Self-pay | Admitting: Internal Medicine

## 2010-11-18 NOTE — Progress Notes (Signed)
Summary: phone/gg  Phone Note Call from Patient   Caller: Daughter Summary of Call: Pt daughter called and states she found out yesterday that pt has not been taking  pradaxa 150 Mg Cap.  She also is not taking coumadin.  This was for atrial fib.  She gets her meds from [facility].  They have not been filling the pradaxa since May. Daughter is [name] and is very upset. [phone] In April Dr Phillips Odor gave pt a coupon ?? for pradaxa, after that month pharmacy didn't send to pt. I called pharmacist at [facility] and they thought pt was enrolled in plan to get the med.  Cost of pradaxa is $270 Initial call taken by: Merrie Roof RN,  June 11, 2010 3:47 PM  Follow-up for Phone Call        I talked with Dr Phillips Odor and informed her of above.  She will call pt on Monday.  instructed pt to take ASA 325 mg until then. Pt insurance Medicare part D does not cover pradaxa. Follow-up by: Merrie Roof RN,  June 11, 2010 4:39 PM  Additional Follow-up for Phone Call Additional follow up Details #1::        Spoke with patient's daughter today. We have decied to stop the pradaxa. Risks>benefits given her fall risk. Will use once daily ASA. Additional Follow-up by: Julaine Fusi  DO,  June 14, 2010 8:49 PM     Appended Document: phone/gg Discussed stopping anticoagulation due to failure to thrive, recent fails, gait problems. Changed to daily ASA.   Clinical Lists Changes  Medications: Removed medication of PRADAXA 150 MG CAPS (DABIGATRAN ETEXILATE MESYLATE) Take 1 tablet by mouth two times a day Added new medication of BUFFERED ASPIRIN 325 MG TABS (ASPIRIN BUF(CACARB-MGCARB-MGO)) Take 1 tablet by mouth once a day

## 2010-11-18 NOTE — Progress Notes (Signed)
Summary: Soc. Work  Programme researcher, broadcasting/film/video of Call: [phone]  home  [phone]  cell  30 min.  Lengthy conversation with dgtr who lives in [place] and is quite stressed about the care of her mother.  Dgtr explained to me that mother forgets to eat and is also OCD about using pots and plates/losing weight. Dgtr is going to [employer] this PM because [employer] has denied mother service and gave info on [employer].   Ulanda Edison is contact at [employer].   [phone]   [phone].  I will call Marjean Donna this PM and dgtr is receptive to my doing so.       Also, Discussed with dgtr other programs that may be of help to her including PACE or Medicaid PCS.  Dgtr is receptive to my making a referral to the PACE program here in [place].

## 2010-11-18 NOTE — Miscellaneous (Signed)
Summary: Referral   Social Work Evaluation Date  05/25/2010 Patient name [name] [name]  Primary MD   : Julaine Fusi  DO Social Worker's name : Dorothe Pea MSW- LCSW  Home [phone]    Cell phone: .  Marland Kitchen     Alternate phone: . Marland Kitchen        Primary Reason for Referral:     Programmer, applications for Seniors  Other [employer].  Family desiring [employer] for elderly patient who is at risk of falling due to osteoarthritis and forgeting to prepare and eat meals.  Action taken by Social Work: Doctor, hospital at [employer] and provided all referral information.  Called Lakara Weiland daughter and advised her that I had made the referral and [employer] would be calling.   Gavin Pound was grateful that we made the referral on behalf of her mother.   Told Deborah to call SW if I could be of further assistance.

## 2010-11-18 NOTE — Progress Notes (Signed)
Summary: Soc. Work  Nurse, children's placed by: SW Summary of Call: Alan Mulder DeMurio at [employer] and asked her to call me back.    Marjean Donna is gone until Monday.

## 2010-11-18 NOTE — Progress Notes (Signed)
Summary: REFILL/GG  Phone Note Refill Request  on January 21, 2010 5:05 PM  Refills Requested: Medication #1:  PRADAXA 150 MG CAPS Take 1 tablet by mouth two times a day. ***PT INSRANCE DOES NOT COVER PRADAXA CAN YOU CHANGE ??   Method Requested: Electronic Initial call taken by: Merrie Roof RN,  January 21, 2010 5:06 PM  Follow-up for Phone Call        No she will need Pradaxa only. Will have to discuss with her daughter. I will give them discount cards- there are some in the medication supply room. I will need to write a letter to her company. I am also going to talk to teh rep and see if there is a program that she will qualify for. Follow-up by: Julaine Fusi  DO,  January 21, 2010 11:20 PM  Additional Follow-up for Phone Call Additional follow up Details #1::        I took discount card to [facility] and activated it for the patient. medication will be delivered today. I will refer to MAP for future meds.   Additional Follow-up by: Julaine Fusi  DO,  January 25, 2010 2:38 PM

## 2010-11-18 NOTE — Progress Notes (Signed)
Summary: REferral for Podiatry  Phone Note Call from Patient   Caller: Daughter Call For: [name] Fusi  DO Summary of Call: Call frompt's daughter said that pt is having problems with her feet and would like to get a Podiatry referral.  Pt's daughter [name] can be reached at [phone] or [phone].Angelina Ok RN  Feb 19, 2010 4:33 PM  Initial call taken by: Angelina Ok RN,  Feb 19, 2010 4:33 PM  Follow-up for Phone Call        will place order. Follow-up by: [name] Fusi  DO,  Feb 22, 2010 2:15 PM  New Problems: FOOT PAIN, BILATERAL (ICD-729.5)   New Problems: FOOT PAIN, BILATERAL (ICD-729.5)

## 2010-11-18 NOTE — Assessment & Plan Note (Signed)
Summary: F/U/EST/VS   Vital Signs:  Patient profile:   77 year old female Height:      63 inches (160.02 cm) Weight:      114.6 pounds (52.09 kg) Temp:     97.0 degrees F (36.11 degrees C) oral Pulse rate:   75 / minute BP sitting:   157 / 76  (left arm)  Vitals Entered By: Blenda Mounts (January 21, 2010 11:28 AM)//GladysHerbin, RN January 21, 2010:28 AM11 CC: follow-up visit Is Patient Diabetic? No Pain Assessment Patient in pain? no       Does patient need assistance? Functional Status Self care Ambulation Impaired:Risk for fall, Wheelchair   Primary Care Provider:  Julaine Fusi  DO  CC:  follow-up visit.  History of Present Illness: [name] [name] comes in today for routine folllow-up on her Hypertension, A-Fib and lower extremity edema which are her major poblems. She has in teh past few weeks had a worsening of her LE edema, this makes it very difficult for her to walk. She takes Lasix but has severe urinary incontinence-mixed type- making it difficult to go to make bathroom. She also continues to have recurrent UTI from using depends. No recent falls. Still complains of severe fatigue at home.   Anticoagulation Management History:      Her last INR was 1.8.    Depression History:      The patient denies a depressed mood most of the day and a diminished interest in her usual daily activities.        The patient denies that she feels like life is not worth living, denies that she wishes that she were dead, and denies that she has thought about ending her life.         Preventive Screening-Counseling & Management  Alcohol-Tobacco     Alcohol drinks/day: 0     Smoking Status: quit     Year Quit: +60 YEARS AGO  Caffeine-Diet-Exercise     Does Patient Exercise: no  Current Medications (verified): 1)  Diltiazem Hcl Er Beads 180 Mg Xr24h-Cap (Diltiazem Hcl Er Beads) .... Take 1 Tablet By Mouth Once A Day 2)  Synthroid 125 Mcg Tabs (Levothyroxine Sodium) ....  Take 1 Tablet By Mouth Once A Day 3)  Enalapril Maleate 20 Mg Tabs (Enalapril Maleate) .... Take 1 Tablet By Mouth Once Daily.  This Is A Decrease From Twice Daily 4)  Hydrochlorothiazide 25 Mg Tabs (Hydrochlorothiazide) .... Take 1 Tablet By Mouth Once A Day 5)  Ensure Nutra Shake Hi-Cal   Liqd (Nutritional Supplements) .... One Shake 1-2 Times Daily. 6)  Warfarin Sodium  Tabs (Warfarin Sodium Tabs) .... Tablet Strength: 2mg  Take As Directed 7)  Triamcinolone Acetonide 0.1 % Crea (Triamcinolone Acetonide) .... Apply To Affected Area Two Times A Day For 1 Week 8)  Lorazepam 0.5 Mg Tabs (Lorazepam) .... Take 1 To 1/2 Tab By Mouth As Neeeded For Nerves 9)  Voltaren 1 % Gel (Diclofenac Sodium) .... Apply To Knee Two Times A Day As Directed 10)  Bactrim Ds 800-160 Mg Tabs (Sulfamethoxazole-Trimethoprim) .... Take 1 Tablet By Mouth Two Times A Day 11)  Pradaxa 150 Mg Caps (Dabigatran Etexilate Mesylate) .... Take 1 Tablet By Mouth Two Times A Day  Allergies: 1)  ! Penicillin  Review of Systems      See HPI  Physical Exam  General:  alert and well-developed.   Lungs:  normal respiratory effort and normal breath sounds.   Heart:  Irregularly irregular rhythm  Abdomen:  soft and non-tender.   Msk:  no joint swelling, no joint warmth, joint tenderness, enlarged PIP joints, and enlarged DIP joints.   Pulses:  R and L carotid,radial,femoral,dorsalis pedis and posterior tibial pulses are full and equal bilaterally Extremities:  L>R 2+ pitting edema- located mostly below teh ankle in teh foot- I suspect this is positional and dependent edema only. Multiple varicosities- slightly inflammed, very ttp superfically- no ulcerations at present or breaks in the skin. Neurologic:  alert & oriented X3, strength normal in all extremities, and sensation intact to light touch.   Skin:  Slight venous stasis changes to LE. Psych:  Tangential thought processes but her memory is very sharp and her insight and  judgement are good.   Impression & Recommendations:  Problem # 1:  UTI'S, RECURRENT (ICD-599.0) Will treat her today. She complains of frequency and burning- much more severe than usual. No fevers. Again advised about seeing a surgical specialist for incontinence. Can also consider using medication.  Her updated medication list for this problem includes:    Bactrim Ds 800-160 Mg Tabs (Sulfamethoxazole-trimethoprim) .Marland Kitchen... Take 1 tablet by mouth two times a day  Orders: T-Urinalysis ([id number]) T-Culture, Urine ([id number])  Problem # 2:  ATRIAL FIBRILLATION (ICD-427.31) Extensive discussion today >69min about starting her on a new oral anticoagulant approved for A-Fib. [name] [name] is a great candidate because she has so much difficulty travel back and forth from our office for INR checks. She is meticulous about her medications. She desperately wants to eat Collard Greens and Cabbage. Additionally, she often required antibiotics for recurrent UTI making it difficult to manage her INR. Will start Pradaxa.Her INR is 2.1 will hold coumadin today and start Pradaxa tomorrow PM dose.  Her updated medication list for this problem includes:    Diltiazem Hcl Er Beads 180 Mg Xr24h-cap (Diltiazem hcl er beads) .Marland Kitchen... Take 1 tablet by mouth once a day    Warfarin Sodium Tabs (Warfarin sodium tabs) .Marland Kitchen... Tablet strength: 2mg  take as directed  Problem # 3:  KNEE PAIN (ICD-719.46) Continue Voltaren Gel for knee pain. Rec hiome PT servces but patient declines.  Problem # 4:  PERIPHERAL EDEMA (ICD-782.3) Was changed to HCTZ  in August by another provider -apparently [name] [name] wasnt taking her Lasix due to urinary incontinence? Her renal function is normal. No signs of heart failure.  Her updated medication list for this problem includes:    Hydrochlorothiazide 25 Mg Tabs (Hydrochlorothiazide) .Marland Kitchen... Take 1 tablet by mouth once a day  Discussed elevation of the legs, use of compression stockings,  sodium restiction, and medication use.   Complete Medication List: 1)  Diltiazem Hcl Er Beads 180 Mg Xr24h-cap (Diltiazem hcl er beads) .... Take 1 tablet by mouth once a day 2)  Synthroid 125 Mcg Tabs (Levothyroxine sodium) .... Take 1 tablet by mouth once a day 3)  Enalapril Maleate 20 Mg Tabs (Enalapril maleate) .... Take 1 tablet by mouth once daily.  this is a decrease from twice daily 4)  Hydrochlorothiazide 25 Mg Tabs (Hydrochlorothiazide) .... Take 1 tablet by mouth once a day 5)  Ensure Nutra Shake Hi-cal Liqd (Nutritional supplements) .... One shake 1-2 times daily. 6)  Warfarin Sodium Tabs (Warfarin sodium tabs) .... Tablet strength: 2mg  take as directed 7)  Triamcinolone Acetonide 0.1 % Crea (Triamcinolone acetonide) .... Apply to affected area two times a day for 1 week 8)  Lorazepam 0.5 Mg Tabs (Lorazepam) .... Take 1 to 1/2 tab by mouth as neeeded  for nerves 9)  Voltaren 1 % Gel (Diclofenac sodium) .... Apply to knee two times a day as directed 10)  Bactrim Ds 800-160 Mg Tabs (Sulfamethoxazole-trimethoprim) .... Take 1 tablet by mouth two times a day 11)  Pradaxa 150 Mg Caps (Dabigatran etexilate mesylate) .... Take 1 tablet by mouth two times a day   Patient Instructions: 1)  We will start Pradaxa tomorrow morning. Do not take your dose of coumadin today. 2)  F/U in 2 months. Prescriptions: PRADAXA 150 MG CAPS (DABIGATRAN ETEXILATE MESYLATE) Take 1 tablet by mouth two times a day  #60 x 3   Entered and Authorized by:   Julaine Fusi  DO   Signed by:   Julaine Fusi  DO on 01/21/2010   Method used:   Faxed to ...       [facility] (retail)       [address]       [place], [place]  [address]       Ph: [phone]       Fax: [phone]   RxID:   [id number] BACTRIM DS 800-160 MG TABS (SULFAMETHOXAZOLE-TRIMETHOPRIM) Take 1 tablet by mouth two times a day  #14 x 3   Entered and Authorized by:   Julaine Fusi  DO   Signed by:   Julaine Fusi  DO on 01/21/2010   Method used:   Faxed to ...       [facility] (retail)       [address]       [place], [place]  [address]       Ph: [phone]       Fax: [phone]   RxID:   [id number]  Process Orders Check Orders Results:     [facility]: Check successful Tests Sent for requisitioning (January 25, 2010 3:00 PM):     01/21/2010: [facility] -- T-Urinalysis [[id number]] (signed)     01/21/2010: [facility] -- T-Culture, Urine [id number] (signed)    Prevention & Chronic Care Immunizations   Influenza vaccine: Fluvax MCR  (07/07/2008)    Tetanus booster: Not documented    Pneumococcal vaccine: Not documented    H. zoster vaccine: Not documented  Colorectal Screening   Hemoccult: Not documented    Colonoscopy: Not documented  Other Screening   Pap smear: Not documented    Mammogram: Not documented    DXA bone density scan: Not documented   Smoking status: quit  (01/21/2010)  Lipids   Total Cholesterol: 155  (09/25/2008)   LDL: 91  (09/25/2008)   LDL Direct: Not documented   HDL: 52  (09/25/2008)   Triglycerides: 60  (09/25/2008)  Hypertension   Last Blood Pressure: 157 / 76  (01/21/2010)   Serum creatinine: 0.84  (12/03/2009)   Serum potassium 3.9  (12/03/2009)  Self-Management Support :    Patient will work on the following items until the next clinic visit to reach self-care goals:     Medications and monitoring: take my medicines every day, bring all of my medications to every visit  (01/21/2010)     Eating: eat more vegetables, use fresh or frozen vegetables, eat foods that are low in salt, eat baked foods instead of fried foods  (01/21/2010)     Activity: take a 30 minute walk every day  (12/03/2009)    Hypertension self-management support: Written self-care plan,  Education handout, Resources for patients handout  (01/21/2010)   Hypertension  self-care plan printed.   Hypertension education handout printed      Resource handout printed.

## 2010-11-18 NOTE — Assessment & Plan Note (Signed)
Summary: EST-CK/FU/MEDS/CFB   Vital Signs:  Patient profile:   77 year old female Height:      63 inches (160.02 cm) Weight:      107.04 pounds (48.65 kg) BMI:     19.03 Temp:     97.6 degrees F (36.44 degrees C) oral Pulse rate:   102 / minute BP sitting:   135 / 72  (left arm)  Vitals Entered By: Angelina Ok RN (May 20, 2010 11:54 AM) CC: Depression Is Patient Diabetic? No Pain Assessment Patient in pain? no      Nutritional Status BMI of 19 -24 = normal  Have you ever been in a relationship where you felt threatened, hurt or afraid?No   Does patient need assistance? Functional Status Self care, Cook/clean, Shopping, Social activities Ambulation Impaired:Risk for fall Comments Uses a cane to ambulate.  Recheck bladder infection.  Voiding frequently.   Primary Care Jackelyn Illingworth:  Julaine Fusi  DO  CC:  Depression.  History of Present Illness: Ms. Apsey comes in today with her daughter Gavin Pound with multiple complaints:  1. Dramatic weight loss: According to her daughter she is not eating well- forgets to eat. refused MOW in the past. No F,C, NS or dyphagias. No Abdominal Pain. Anxiety much worse.  2. Urinary Incontinence: Severe symptoms, QOL issue now, according to her daughter Ms. Privette spends half her day changing her depends and managing her urine problems. Recurrent UTI-effect on cognition.  3. Constipation: Reports "bowels are locked up" and that "it feels like glass cutting though my rectum when I strain". Ongoing problem.  4. Recurrent UTI. Standing ABX order. Lats tx 3 day ago- symptoms improved.  5. Hypertension- stopped her enalapril because she saw a TV commercial for "enbrel" which she thought was enalapril and stopped taking it. I discussed this with her and she was extremely frustrated.  Depression History:      The patient denies a depressed mood most of the day and a diminished interest in her usual daily activities.        The patient denies  that she feels like life is not worth living, denies that she wishes that she were dead, and denies that she has thought about ending her life.         Preventive Screening-Counseling & Management  Alcohol-Tobacco     Alcohol drinks/day: 0     Smoking Status: quit     Year Quit: +60 YEARS AGO  Current Medications (verified): 1)  Diltiazem Hcl Er Beads 180 Mg Xr24h-Cap (Diltiazem Hcl Er Beads) .... Take 1 Tablet By Mouth Once A Day 2)  Synthroid 125 Mcg Tabs (Levothyroxine Sodium) .... Take 1 Tablet By Mouth Once A Day 3)  Enalapril Maleate 20 Mg Tabs (Enalapril Maleate) .... Take 1 Tablet By Mouth Once Daily.  This Is A Decrease From Twice Daily 4)  Hydrochlorothiazide 25 Mg Tabs (Hydrochlorothiazide) .... Take 1 Tablet By Mouth Once A Day 5)  Ensure Nutra Shake Hi-Cal   Liqd (Nutritional Supplements) .... One Shake 1-2 Times Daily. 6)  Warfarin Sodium  Tabs (Warfarin Sodium Tabs) .... Tablet Strength: 2mg  Take As Directed 7)  Triamcinolone Acetonide 0.1 % Crea (Triamcinolone Acetonide) .... Apply To Affected Area Two Times A Day For 1 Week 8)  Lorazepam 0.5 Mg Tabs (Lorazepam) .... Take 1 To 1/2 Tab By Mouth As Neeeded For Nerves 9)  Voltaren 1 % Gel (Diclofenac Sodium) .... Apply To Knee Two Times A Day As Directed 10)  Bactrim Ds  800-160 Mg Tabs (Sulfamethoxazole-Trimethoprim) .... Take 1 Tablet By Mouth Two Times A Day 11)  Pradaxa 150 Mg Caps (Dabigatran Etexilate Mesylate) .... Take 1 Tablet By Mouth Two Times A Day 12)  Colace 100 Mg Caps (Docusate Sodium) .... Take 1 Tablet By Mouth Two Times A Day 13)  Lidocaine-Hydrocortisone Ace 3-0.5 % Crea (Lidocaine-Hydrocortisone Ace) .... Apply Up To 4 Times A Day As Needed For Rectal Pain  Allergies (verified): 1)  ! Penicillin  Past History:  Past Medical History: Anxiety Atrial fibrillation Depression Hypertension x years Hypothyroidism Inguinal hernia repair 2011 Osteoarthritis Cerebrovascular accident, hx of Degenerative  joint disease Chronic renal insufficiency Recurrent urinary tract infections  Social History: Lives alone next door to son. Daughter in [place] visits often. She is basically independent in her living with some minimal help.   No Smoking hx. No EtOH.  Review of Systems      See HPI  Physical Exam  General:  alert and well-developed.   Lungs:  normal respiratory effort and normal breath sounds.   Heart:  Irregularly irregular rhythm  Abdomen:  soft and non-tender.   Msk:  no joint tenderness, no joint swelling, and no joint warmth.   Extremities:  1+ edema, improved redness. Neurologic:  More agitated than usual and irritable. Skin:  no rashes.   Psych:  Tangential thought processes and pressured speech at baseline.   Impression & Recommendations:  Problem # 1:  CONSTIPATION (ICD-564.00) Will start her on daily stool softner and Lidocaine HC for hemmoroids. Rec daily Fiber.  Her updated medication list for this problem includes:    Colace 100 Mg Caps (Docusate sodium) .Marland Kitchen... Take 1 tablet by mouth two times a day  Problem # 2:  WEIGHT LOSS, ABNORMAL (ICD-783.21) Will give MOW a try for one month and recheck her weight. If continued weight loss I will initiate w/u for underlying malignancy. She has refused treatment for anxiety and depression in the past.  Problem # 3:  UTI'S, RECURRENT (ICD-599.0) Will check UA today.  Her updated medication list for this problem includes:    Bactrim Ds 800-160 Mg Tabs (Sulfamethoxazole-trimethoprim) .Marland Kitchen... Take 1 tablet by mouth two times a day  Orders: T-Culture, Urine ([id number]) T-Urinalysis ([id number])  Problem # 4:  URINARY INCONTINENCE (ICD-788.30) This is a major QOL problem at this point. I think repair of her bladder/prolapsed uterus is critical. She has constant leakage of urine- and when combined with her Lasix dose makes this very difficult to manage at home.  Complete Medication List: 1)  Diltiazem Hcl Er Beads 180 Mg  Xr24h-cap (Diltiazem hcl er beads) .... Take 1 tablet by mouth once a day 2)  Synthroid 125 Mcg Tabs (Levothyroxine sodium) .... Take 1 tablet by mouth once a day 3)  Enalapril Maleate 20 Mg Tabs (Enalapril maleate) .... Take 1 tablet by mouth once daily.  this is a decrease from twice daily 4)  Hydrochlorothiazide 25 Mg Tabs (Hydrochlorothiazide) .... Take 1 tablet by mouth once a day 5)  Ensure Nutra Shake Hi-cal Liqd (Nutritional supplements) .... One shake 1-2 times daily. 6)  Warfarin Sodium Tabs (Warfarin sodium tabs) .... Tablet strength: 2mg  take as directed 7)  Triamcinolone Acetonide 0.1 % Crea (Triamcinolone acetonide) .... Apply to affected area two times a day for 1 week 8)  Lorazepam 0.5 Mg Tabs (Lorazepam) .... Take 1 to 1/2 tab by mouth as neeeded for nerves 9)  Voltaren 1 % Gel (Diclofenac sodium) .... Apply to knee two times a day  as directed 10)  Bactrim Ds 800-160 Mg Tabs (Sulfamethoxazole-trimethoprim) .... Take 1 tablet by mouth two times a day 11)  Pradaxa 150 Mg Caps (Dabigatran etexilate mesylate) .... Take 1 tablet by mouth two times a day 12)  Colace 100 Mg Caps (Docusate sodium) .... Take 1 tablet by mouth two times a day 13)  Lidocaine-hydrocortisone Ace 3-0.5 % Crea (Lidocaine-hydrocortisone ace) .... Apply up to 4 times a day as needed for rectal pain  Other Orders: Gynecologic Referral (Gyn)  Patient Instructions: 1)  Please schedule a follow-up appointment in 1 month. Prescriptions: LIDOCAINE-HYDROCORTISONE ACE 3-0.5 % CREA (LIDOCAINE-HYDROCORTISONE ACE) Apply up to 4 times a day as needed for rectal pain  #1 tube x 3   Entered and Authorized by:   Julaine Fusi  DO   Signed by:   Julaine Fusi  DO on 05/20/2010   Method used:   Faxed to ...       [facility] (retail)       [address]       [place], [place]  [address]       Ph: [phone]       Fax: [phone]   RxID:   [id number] COLACE 100 MG CAPS (DOCUSATE  SODIUM) Take 1 tablet by mouth two times a day  #60 x 11   Entered and Authorized by:   Julaine Fusi  DO   Signed by:   Julaine Fusi  DO on 05/20/2010   Method used:   Faxed to ...       [facility] (retail)       [address]       [place], [place]  [address]       Ph: [phone]       Fax: [phone]   RxID:   [id number]    Vital Signs:  Patient profile:   77 year old female Height:      63 inches (160.02 cm) Weight:      107.04 pounds (48.65 kg) BMI:     19.03 Temp:     97.6 degrees F (36.44 degrees C) oral Pulse rate:   102 / minute BP sitting:   135 / 72  (left arm)  Vitals Entered By: Angelina Ok RN (May 20, 2010 11:54 AM)   Prevention & Chronic Care Immunizations   Influenza vaccine: Fluvax MCR  (07/07/2008)    Tetanus booster: Not documented    Pneumococcal vaccine: Not documented    H. zoster vaccine: Not documented  Colorectal Screening   Hemoccult: Not documented    Colonoscopy: Not documented  Other Screening   Pap smear: Not documented    Mammogram: Not documented    DXA bone density scan: Not documented   Smoking status: quit  (05/20/2010)  Lipids   Total Cholesterol: 155  (09/25/2008)   LDL: 91  (09/25/2008)   LDL Direct: Not documented   HDL: 52  (09/25/2008)   Triglycerides: 60  (09/25/2008)  Hypertension   Last Blood Pressure: 135 / 72  (05/20/2010)   Serum creatinine: 0.84  (12/03/2009)   Serum potassium 3.9  (12/03/2009)  Self-Management Support :    Patient will work on the following items until the next clinic visit to reach self-care goals:     Medications and monitoring: take my medicines every day, bring all of my medications to every visit  (05/20/2010)  Eating: drink diet soda or water instead of juice or soda, eat more vegetables, use fresh or frozen vegetables, eat foods that are low in salt, eat baked foods instead of fried foods, eat fruit for snacks and desserts, limit or  avoid alcohol  (05/20/2010)     Activity: take a 30 minute walk every day  (05/20/2010)    Hypertension self-management support: Written self-care plan, Education handout, Pre-printed educational material, Resources for patients handout  (05/20/2010)   Hypertension self-care plan printed.   Hypertension education handout printed      Resource handout printed.   Process Orders Check Orders Results:     [facility]: Check successful Tests Sent for requisitioning (May 21, 2010 8:11 AM):     05/20/2010: [facility] -- T-Culture, Urine [[id number]] (signed)     05/20/2010: [facility] -- T-Urinalysis [[id number]] (signed)     Appended Document: Orders Update    Clinical Lists Changes Patient need help getting Meals delievered- elderly, homebound. Difficulty cooking.  Orders: Added new Referral order of Social Work Referral (Social ) - Signed

## 2010-11-18 NOTE — Progress Notes (Signed)
Summary: refill/ggj  Phone Note Refill Request  on November 11, 2009 4:23 PM  Refills Requested: Medication #1:  HYDROCHLOROTHIAZIDE 25 MG TABS Take 1 tablet by mouth once a day   Last Refilled: 10/16/2009  Medication #2:  DILTIAZEM HCL ER BEADS 180 MG XR24H-CAP Take 1 tablet by mouth once a day   Last Refilled: 10/16/2009  Method Requested: Fax to Local Pharmacy Initial call taken by: Merrie Roof RN,  November 11, 2009 4:23 PM  Follow-up for Phone Call        Refill approved-nurse to complete Follow-up by: Julaine Fusi  DO,  November 17, 2009 8:23 AM  Additional Follow-up for Phone Call Additional follow up Details #1::        Rx faxed to pharmacy Additional Follow-up by: Merrie Roof RN,  November 17, 2009 9:47 AM    Prescriptions: HYDROCHLOROTHIAZIDE 25 MG TABS (HYDROCHLOROTHIAZIDE) Take 1 tablet by mouth once a day  #31 x 6   Entered and Authorized by:   Julaine Fusi  DO   Signed by:   Julaine Fusi  DO on 11/17/2009   Method used:   Faxed to ...       [facility] (retail)       [address]       [place], [place]  [address]       Ph: [phone]       Fax: [phone]   RxID:   [id number] DILTIAZEM HCL ER BEADS 180 MG XR24H-CAP (DILTIAZEM HCL ER BEADS) Take 1 tablet by mouth once a day  #30 x 6   Entered and Authorized by:   Julaine Fusi  DO   Signed by:   Julaine Fusi  DO on 11/17/2009   Method used:   Faxed to ...       [facility] (retail)       [address]       [place], [place]  [address]       Ph: [phone]       Fax: [phone]   RxID:   [id number]

## 2010-11-18 NOTE — Progress Notes (Signed)
Summary: Appointment  Phone Note Outgoing Call   Call placed by: Angelina Ok RN,  May 21, 2010 11:47 AM Call placed to: Patient Summary of Call: Call to pt's daughter to set up an appointment with the GYN/ Urologist Dr. Su Hilt.  Call to the office of Dr. Su Hilt would like for pt or her daughter to call for the appointment. Angelina Ok RN  May 21, 2010 11:48 AM  Initial call taken by: Angelina Ok RN,  May 21, 2010 11:48 AM  Follow-up for Phone Call        I will do a referral letter. Follow-up by: Julaine Fusi  DO,  May 21, 2010 4:49 PM

## 2010-11-18 NOTE — Assessment & Plan Note (Signed)
Summary: follow-up/gg    work in per Dr Phillips Odor   Vital Signs:  Patient profile:   77 year old female Height:      63 inches (160.02 cm) Weight:      112.05 pounds (50.93 kg) BMI:     19.92 Temp:     97.6 degrees F (36.44 degrees C) oral Pulse rate:   80 / minute BP sitting:   139 / 86  (right arm)  Vitals Entered By: Angelina Ok RN (July 15, 2010 10:59 AM) Is Patient Diabetic? No Pain Assessment Patient in pain? no      Nutritional Status BMI of 19 -24 = normal  Have you ever been in a relationship where you felt threatened, hurt or afraid?No   Does patient need assistance? Functional Status Self care Ambulation Impaired:Risk for fall Comments Check up.   Primary Care Provider:  Julaine Fusi  DO   History of Present Illness: [name] [name] is in for routine follow up. her major issues continue to be persistent urinary incontinence and daytime sleepiness. She has improved in terms of her weight loss today. She also struggles with LE edema from venous stasis. No other complaints today. Refills are current.  Depression History:      The patient denies a depressed mood most of the day and a diminished interest in her usual daily activities.         Preventive Screening-Counseling & Management  Alcohol-Tobacco     Alcohol drinks/day: 0     Smoking Status: quit     Year Quit: +60 YEARS AGO  Current Medications (verified): 1)  Diltiazem Hcl Er Beads 180 Mg Xr24h-Cap (Diltiazem Hcl Er Beads) .... Take 1 Tablet By Mouth Once A Day 2)  Synthroid 125 Mcg Tabs (Levothyroxine Sodium) .... Take 1 Tablet By Mouth Once A Day 3)  Enalapril Maleate 20 Mg Tabs (Enalapril Maleate) .... Take 1 Tablet By Mouth Once Daily.  This Is A Decrease From Twice Daily 4)  Hydrochlorothiazide 25 Mg Tabs (Hydrochlorothiazide) .... Take 1 Tablet By Mouth Once A Day 5)  Ensure Nutra Shake Hi-Cal   Liqd (Nutritional Supplements) .... One Shake 1-2 Times Daily. 6)  Warfarin Sodium  Tabs (Warfarin  Sodium Tabs) .... Tablet Strength: 2mg  Take As Directed 7)  Triamcinolone Acetonide 0.1 % Crea (Triamcinolone Acetonide) .... Apply To Affected Area Two Times A Day For 1 Week 8)  Lorazepam 0.5 Mg Tabs (Lorazepam) .... Take 1 To 1/2 Tab By Mouth As Neeeded For Nerves 9)  Voltaren 1 % Gel (Diclofenac Sodium) .... Apply To Knee Two Times A Day As Directed 10)  Bactrim Ds 800-160 Mg Tabs (Sulfamethoxazole-Trimethoprim) .... Take 1 Tablet By Mouth Two Times A Day 11)  Colace 100 Mg Caps (Docusate Sodium) .... Take 1 Tablet By Mouth Two Times A Day 12)  Lidocaine-Hydrocortisone Ace 3-0.5 % Crea (Lidocaine-Hydrocortisone Ace) .... Apply Up To 4 Times A Day As Needed For Rectal Pain 13)  Buffered Aspirin 325 Mg Tabs (Aspirin Buf(Cacarb-Mgcarb-Mgo)) .... Take 1 Tablet By Mouth Once A Day  Allergies: 1)  ! Penicillin  Review of Systems      See HPI  Physical Exam  General:  Thin, alert and well-developed.   Head:  normocephalic and atraumatic.   Eyes:  pupils equal, pupils round, and pupils reactive to light.   Mouth:  poor dentition.   Neck:  supple and full ROM.   Lungs:  normal breath sounds.   Heart:  normal rate, regular rhythm, no  gallop Abdomen:  soft and non-tender.   Msk:  Multiple joints with OA changes. PIP/DIP changes. Bilateral knees with joint line tenderness no joint swelling, no joint warmth, and no redness over joints.   Neurologic:  alert & oriented X3.  Mild memory impairment-repeated questions. Skin:  LE venous stasis dermatitis. Mild redness bilaterally, no signs of infection. multiple large varicosities. Psych:  Anxious at baseline.   Impression & Recommendations:  Problem # 1:  DEGENERATIVE JOINT DISEASE, KNEES, BILATERAL (ICD-715.96) Recommeded Tylenol for pain. Did not use voltaren gel when prescribed.  Her updated medication list for this problem includes:    Buffered Aspirin 325 Mg Tabs (Aspirin buf(cacarb-mgcarb-mgo)) .Marland Kitchen... Take 1 tablet by mouth once a  day  Problem # 2:  WEIGHT LOSS, ABNORMAL (ICD-783.21) Improved today. Up 5 lbs. Patient has meal delivery now. Using Ensure now daily.  Problem # 3:  URINARY INCONTINENCE (ICD-788.30) Patient has seen urology and uro/gyn for evaluation. Atypical cells found in urine at GYN. Low suspicion for malignancy- probably related to recurrent UTI. Uro eval pending F/U appt next week.  Problem # 4:  UTI'S, RECURRENT (ICD-599.0) Chronic recurent UTI.  Her updated medication list for this problem includes:    Bactrim Ds 800-160 Mg Tabs (Sulfamethoxazole-trimethoprim) .Marland Kitchen... Take 1 tablet by mouth two times a day  Complete Medication List: 1)  Diltiazem Hcl Er Beads 180 Mg Xr24h-cap (Diltiazem hcl er beads) .... Take 1 tablet by mouth once a day 2)  Synthroid 125 Mcg Tabs (Levothyroxine sodium) .... Take 1 tablet by mouth once a day 3)  Enalapril Maleate 20 Mg Tabs (Enalapril maleate) .... Take 1 tablet by mouth once daily.  this is a decrease from twice daily 4)  Hydrochlorothiazide 25 Mg Tabs (Hydrochlorothiazide) .... Take 1 tablet by mouth once a day 5)  Ensure Nutra Shake Hi-cal Liqd (Nutritional supplements) .... One shake 1-2 times daily. 6)  Warfarin Sodium Tabs (Warfarin sodium tabs) .... Tablet strength: 2mg  take as directed 7)  Triamcinolone Acetonide 0.1 % Crea (Triamcinolone acetonide) .... Apply to affected area two times a day for 1 week 8)  Lorazepam 0.5 Mg Tabs (Lorazepam) .... Take 1 to 1/2 tab by mouth as neeeded for nerves 9)  Voltaren 1 % Gel (Diclofenac sodium) .... Apply to knee two times a day as directed 10)  Bactrim Ds 800-160 Mg Tabs (Sulfamethoxazole-trimethoprim) .... Take 1 tablet by mouth two times a day 11)  Colace 100 Mg Caps (Docusate sodium) .... Take 1 tablet by mouth two times a day 12)  Lidocaine-hydrocortisone Ace 3-0.5 % Crea (Lidocaine-hydrocortisone ace) .... Apply up to 4 times a day as needed for rectal pain 13)  Buffered Aspirin 325 Mg Tabs (Aspirin  buf(cacarb-mgcarb-mgo)) .... Take 1 tablet by mouth once a day  Patient Instructions: 1)  Please schedule a follow-up appointment in 3 months sooner if needed.  Prevention & Chronic Care Immunizations   Influenza vaccine: Fluvax MCR  (07/07/2008)    Tetanus booster: Not documented    Pneumococcal vaccine: Not documented    H. zoster vaccine: Not documented  Colorectal Screening   Hemoccult: Not documented    Colonoscopy: Not documented  Other Screening   Pap smear: Not documented    Mammogram: Not documented    DXA bone density scan: Not documented   Smoking status: quit  (07/15/2010)  Lipids   Total Cholesterol: 155  (09/25/2008)   LDL: 91  (09/25/2008)   LDL Direct: Not documented   HDL: 52  (09/25/2008)  Triglycerides: 60  (09/25/2008)  Hypertension   Last Blood Pressure: 139 / 86  (07/15/2010)   Serum creatinine: 0.84  (12/03/2009)   Serum potassium 3.9  (12/03/2009)  Self-Management Support :    Patient will work on the following items until the next clinic visit to reach self-care goals:     Medications and monitoring: take my medicines every day, bring all of my medications to every visit  (07/15/2010)     Eating: drink diet soda or water instead of juice or soda, eat more vegetables, use fresh or frozen vegetables, eat foods that are low in salt, eat baked foods instead of fried foods, eat fruit for snacks and desserts, limit or avoid alcohol  (07/15/2010)     Activity: take a 30 minute walk every day  (07/15/2010)    Hypertension self-management support: Written self-care plan, Education handout, Pre-printed educational material  (07/15/2010)   Hypertension self-care plan printed.   Hypertension education handout printed    Orders Added: 1)  Est. Patient Level IV [99214]   Laboratory Results   Urine Tests  Date/Time Recieved: 07/15/2010 11:57 AM GH Date/Time Reported: 07/15/2010 12:03 PM Gh  Routine Urinalysis   Color: lt.  yellow Appearance: Hazy Glucose: negative   (Normal Range: Negative) Bilirubin: negative   (Normal Range: Negative) Ketone: negative   (Normal Range: Negative) Spec. Gravity: 1.015   (Normal Range: 1.003-1.035) Blood: negative   (Normal Range: Negative) pH: 7.0   (Normal Range: 5.0-8.0) Protein: negative   (Normal Range: Negative) Urobilinogen: 0.2   (Normal Range: 0-1) Nitrite: negative   (Normal Range: Negative) Leukocyte Esterace: trace   (Normal Range: Negative)

## 2010-11-18 NOTE — Miscellaneous (Signed)
Summary: Orders Update  Clinical Lists Changes  Orders: Added new Referral order of Social Work Referral (Social ) - Signed 

## 2010-11-18 NOTE — Progress Notes (Signed)
Summary: Refill/gh  Phone Note Refill Request Message from:  Fax from Pharmacy on June 07, 2010 4:19 PM  Refills Requested: Medication #1:  DILTIAZEM HCL ER BEADS 180 MG XR24H-CAP Take 1 tablet by mouth once a day   Last Refilled: 05/12/2010  Medication #2:  ENALAPRIL MALEATE 20 MG TABS Take 1 tablet by mouth once daily.  This is a decrease from twice daily   Last Refilled: 05/12/2010  Medication #3:  HYDROCHLOROTHIAZIDE 25 MG TABS Take 1 tablet by mouth once a day   Last Refilled: 05/12/2010  Method Requested: Electronic Initial call taken by: Angelina Ok RN,  June 07, 2010 4:20 PM    Prescriptions: ENALAPRIL MALEATE 20 MG TABS (ENALAPRIL MALEATE) Take 1 tablet by mouth once daily.  This is a decrease from twice daily  #31 x 6   Entered and Authorized by:   Julaine Fusi  DO   Signed by:   Julaine Fusi  DO on 06/08/2010   Method used:   Faxed to ...       [facility] (retail)       [address]       [place], [place]  [address]       Ph: [phone]       Fax: [phone]   RxID:   [id number] HYDROCHLOROTHIAZIDE 25 MG TABS (HYDROCHLOROTHIAZIDE) Take 1 tablet by mouth once a day  #31 x 6   Entered and Authorized by:   Julaine Fusi  DO   Signed by:   Julaine Fusi  DO on 06/08/2010   Method used:   Faxed to ...       [facility] (retail)       [address]       [place], [place]  [address]       Ph: [phone]       Fax: [phone]   RxID:   [id number] DILTIAZEM HCL ER BEADS 180 MG XR24H-CAP (DILTIAZEM HCL ER BEADS) Take 1 tablet by mouth once a day  #31 x 6   Entered and Authorized by:   Julaine Fusi  DO   Signed by:   Julaine Fusi  DO on 06/08/2010   Method used:   Faxed to ...       [facility] (retail)       [address]       [place], [place]  [address]       Ph: [phone]       Fax: [phone]   RxID:   [id number]

## 2010-11-18 NOTE — Progress Notes (Signed)
Summary: phone/gg  Phone Note Call from Patient   Summary of Call: Pt's daughter called and is taking her to GYN this Thursday.   She is not able to see you until November, per schedule.  She is do back for appointment this month.  Is there anyway you can see her sooner?   Daughter is [name] # [phone]  or [phone] Initial call taken by: Merrie Roof RN,  June 08, 2010 4:47 PM  Follow-up for Phone Call        I will look at my schedule and let you know ASAP!  Follow-up by: Julaine Fusi  DO,  June 09, 2010 11:03 PM  Additional Follow-up for Phone Call Additional follow up Details #1::        Pt went to GYN yesterday.  She needs a CT of pelvic area.  Daughter wants to schedule that  the day she can see her. Can you check your schedule?? Additional Follow-up by: Merrie Roof RN,  June 11, 2010 10:46 AM    Additional Follow-up for Phone Call Additional follow up Details #2::    Please add Ms. Beel on to my schedule last 2 weeks in September. Follow-up by: Julaine Fusi  DO,  June 14, 2010 8:51 PM  Additional Follow-up for Phone Call Additional follow up Details #3:: Details for Additional Follow-up Action Taken: Pt scheduled for 9/29 daughter informed Additional Follow-up by: Merrie Roof RN,  June 15, 2010 10:34 AM

## 2010-11-18 NOTE — Progress Notes (Signed)
Summary: Soc. Work  Programme researcher, broadcasting/film/video of Call: Call to daughter for f/u on patient.   She recd an acceptance letter from [employer] but there is a wait list.  In the meantime [name] makes meals and freezes them for her mother.   Debra declines [facility] at this time because her mother does not want to lose Dr. Phillips Odor as her physician.  I told [name] that we could order a PCS assessment and [name] will further discuss with her mother.  I have the Sept. Dr. Phillips Odor visit on my calendar to visit with patient and daughter at that time.

## 2010-11-18 NOTE — Progress Notes (Signed)
Summary: refill/gg  Phone Note Refill Request  on March 18, 2010 10:16 AM  Refills Requested: Medication #1:  SYNTHROID 125 MCG TABS Take 1 tablet by mouth once a day   Last Refilled: 02/18/2010  Method Requested: Fax to Local Pharmacy Initial call taken by: Merrie Roof RN,  March 18, 2010 10:16 AM  Follow-up for Phone Call        Refill approved-nurse to complete Follow-up by: Julaine Fusi  DO,  March 18, 2010 2:42 PM    Prescriptions: SYNTHROID 125 MCG TABS (LEVOTHYROXINE SODIUM) Take 1 tablet by mouth once a day  #30 x 3   Entered and Authorized by:   Julaine Fusi  DO   Signed by:   Julaine Fusi  DO on 03/18/2010   Method used:   Faxed to ...       [facility] (retail)       [address]       [place], [place]  [address]       Ph: [phone]       Fax: [phone]   RxID:   [id number]   Appended Document: refill/gg faxed in

## 2010-11-18 NOTE — Progress Notes (Signed)
Summary: daughter, visit/ hla  Phone Note Other Incoming   Summary of Call: pt's daughter calls wants to speak w/ you concerning pt's appt today, ph# [phone] Initial call taken by: Marin Roberts RN,  July 15, 2010 4:37 PM  Follow-up for Phone Call        emailed daughter thx. Follow-up by: Julaine Fusi  DO,  July 25, 2010 5:58 PM

## 2010-11-18 NOTE — Assessment & Plan Note (Signed)
Summary: COU/CH  Anticoagulant Therapy Managed by: Barbera Setters. Janie Morning  PharmD CACP Referring MD: Rollene Rotunda, MD PCP: Julaine Fusi  DO [facility] Attending: Julaine Fusi DO Indication 1: Atrial fibrillation Indication 2: Aftercare long term use Anticoagulants V58.61,V58.83 Start date: 12/02/2003 Duration: Indefinite  Patient Assessment Reviewed by: Chancy Milroy PharmD  January 21, 2010 Medication review: verified warfarin dosage & schedule,verified previous prescription medications, verified doses & any changes, verified new medications, reviewed OTC medications, reviewed OTC health products-vitamins supplements etc Complications: none Dietary changes: none   Health status changes: none   Lifestyle changes: none   Recent/future hospitalizations: none   Recent/future procedures: none   Recent/future dental: none Patient Assessment Part 2:  Have you MISSED ANY DOSES or CHANGED TABLETS?  No missed Warfarin doses or changed tablets.  Have you had any BRUISING or BLEEDING ( nose or gum bleeds,blood in urine or stool)?  No reported bruising or bleeding in nose, gums, urine, stool.  Have you STARTED or STOPPED any MEDICATIONS, including OTC meds,herbals or supplements?  No other medications or herbal supplements were started or stopped.  Have you CHANGED your DIET, especially green vegetables,or ALCOHOL intake?  No changes in diet or alcohol intake.  Have you had any ILLNESSES or HOSPITALIZATIONS?  No reported illnesses or hospitalizations  Have you had any signs of CLOTTING?(chest discomfort,dizziness,shortness of breath,arms tingling,slurred speech,swelling or redness in leg)    No chest discomfort, dizziness, shortness of breath, tingling in arm, slurred speech, swelling, or redness in leg.     Treatment  Target INR: 2.0-3.0 INR: 2.1  Date: 01/21/2010 Regimen In:  34mg /wk INR reflects regimen in: 2.1       Comments: Discussed with Dr. Phillips Odor, patient and daughter. Will  discontinue warfarin today. Will commence upon dabigatran 150mg  by mouth two times a day on Saturday 9-Apr-11.  Allergies: 1)  ! Penicillin

## 2010-11-18 NOTE — Assessment & Plan Note (Signed)
Summary: est-ck/fu/meds/cfb   Vital Signs:  Patient profile:   77 year old female Height:      63 inches (160.02 cm) Weight:      116.03 pounds (52.74 kg) Temp:     97.5 degrees F (36.39 degrees C) oral Pulse rate:   88 / minute BP sitting:   148 / 94  (right arm)  Vitals Entered By: Angelina Ok RN (December 03, 2009 10:32 AM) CC: Depression Is Patient Diabetic? No Pain Assessment Patient in pain? yes     Location: left knee Intensity: 8 Type: sharp/cramong Onset of pain  Constant Nutritional Status BMI of 19 -24 = normal  Have you ever been in a relationship where you felt threatened, hurt or afraid?No   Does patient need assistance? Functional Status Self care Ambulation Impaired:Risk for fall Comments Uses a cane.  Pain in knee.  Pain at night goes from toes to knee.  Sharp Crampng at times with throbbing.  Left knee is swollen.  Rt nares had some blood this am.    INR this am per Juleen Starr was 1.8  Had stoped her HCTZ.  Unsure of what the pill was for.   Primary Care Provider:  Julaine Fusi  DO  CC:  Depression.  History of Present Illness: [name] [name] comes in fro routine follow-up on BP. She is complaining of left knee pain today and is having more trouble walking. Her daughter is with her an provides a large portion of the history.   Depression History:      The patient is having a depressed mood most of the day and has a diminished interest in her usual daily activities.        The patient denies that she feels like life is not worth living, denies that she wishes that she were dead, and denies that she has thought about ending her life.         Current Medications (verified): 1)  Diltiazem Hcl Er Beads 180 Mg Xr24h-Cap (Diltiazem Hcl Er Beads) .... Take 1 Tablet By Mouth Once A Day 2)  Synthroid 100 Mcg Tabs (Levothyroxine Sodium) .... Take 1 Tablet By Mouth Once A Day 3)  Enalapril Maleate 20 Mg Tabs (Enalapril Maleate) .... Take 1 Tablet By Mouth Once Daily.   This Is A Decrease From Twice Daily 4)  Hydrochlorothiazide 25 Mg Tabs (Hydrochlorothiazide) .... Take 1 Tablet By Mouth Once A Day 5)  Ensure Nutra Shake Hi-Cal   Liqd (Nutritional Supplements) .... One Shake 1-2 Times Daily. 6)  Warfarin Sodium  Tabs (Warfarin Sodium Tabs) .... Tablet Strength: 2mg  Take As Directed 7)  Triamcinolone Acetonide 0.1 % Crea (Triamcinolone Acetonide) .... Apply To Affected Area Two Times A Day For 1 Week 8)  Lorazepam 0.5 Mg Tabs (Lorazepam) .... Take 1 To 1/2 Tab By Mouth As Neeeded For Nerves 9)  Voltaren 1 % Gel (Diclofenac Sodium) .... Apply To Knee Two Times A Day As Directed  Allergies (verified): 1)  ! Penicillin  Physical Exam  General:  alert and well-developed.   Lungs:  normal respiratory effort and normal breath sounds.   Heart:  Irregularly irregular rhythm  Abdomen:  Well healed right LQ hernia inscision. Skin:  venous stasis dermatitis Psych:  Much more calm today and less tangential (daughter gave her 1/2 of an Ativan)   Impression & Recommendations:  Problem # 1:  HYPERTENSION (ICD-401.9) She has not been taking HCTZ. Will restart today.  Her updated medication list for this problem  includes:    Diltiazem Hcl Er Beads 180 Mg Xr24h-cap (Diltiazem hcl er beads) .Marland Kitchen... Take 1 tablet by mouth once a day    Enalapril Maleate 20 Mg Tabs (Enalapril maleate) .Marland Kitchen... Take 1 tablet by mouth once daily.  this is a decrease from twice daily    Hydrochlorothiazide 25 Mg Tabs (Hydrochlorothiazide) .Marland Kitchen... Take 1 tablet by mouth once a day  BP today: 148/94 Prior BP: 161/80 (06/03/2009)  Labs Reviewed: K+: 4.0 (06/03/2009) Creat: : 0.92 (06/03/2009)   Chol: 155 (09/25/2008)   HDL: 52 (09/25/2008)   LDL: 91 (09/25/2008)   TG: 60 (09/25/2008)  Problem # 2:  ANXIETY (ICD-300.00) Patient has agreed to use low dose lorazepam as needed.  Her updated medication list for this problem includes:    Lorazepam 0.5 Mg Tabs (Lorazepam) .Marland Kitchen... Take 1 to 1/2 tab  by mouth as neeeded for nerves  Problem # 3:  KNEE PAIN (ICD-719.46) Aspirated 3-4cc of synovial fluid from her left knee, blood in fluid, her INR was 1.8 -she is on coumadin.injected 3 cc lidocaine and 1 ml of depomedrol. No complications other than blood noted in synovial fluid. Applied pressure and ice after procedure. Prescribed topical voiltaren gel for pain.  Orders: T- * Misc. Laboratory test [phone]) Diagnostic X-Ray/Fluoroscopy (Diagnostic X-Ray/Flu)  Problem # 4:  ASTEATOTIC ECZEMA (ICD-706.8) Reminded patient to not rub her legs in alcohol.  Problem # 5:  HYPOTHYROIDISM (ICD-244.9) Assessment: Comment Only Will check TSH today. Her updated medication list for this problem includes:    Synthroid 125 Mcg Tabs (Levothyroxine sodium) .Marland Kitchen... Take 1 tablet by mouth once a day  Orders: T-TSH ([id number])  Problem # 6:  PERIPHERAL EDEMA (ICD-782.3) Added diuretic back on to BP medications. Her updated medication list for this problem includes:    Hydrochlorothiazide 25 Mg Tabs (Hydrochlorothiazide) .Marland Kitchen... Take 1 tablet by mouth once a day  Problem # 7:  ANEMIA, NORMOCYTIC (ICD-285.9) Will repeat CBC today.  Hgb: 11.9 (09/25/2008)   Hct: 35.9 (09/25/2008)   Platelets: 251 (09/25/2008) RBC: 4.04 (09/25/2008)   RDW: 14.1 (09/25/2008)   WBC: 7.8 (09/25/2008) MCV: 88.9 (09/25/2008)   MCHC: 33.1 (09/25/2008) Ferritin: 25 (03/27/2008) Iron: 53 (03/27/2008)   TIBC: 313 (03/27/2008)   % Sat: 17 (03/27/2008) B12: 366 (03/27/2008)   Folate: 1404 (03/27/2008)   TSH: 2.390 (03/13/2008)  Problem # 8:  ATRIAL FIBRILLATION (ICD-427.31) Rate controlled. No change in medications.  Her updated medication list for this problem includes:    Diltiazem Hcl Er Beads 180 Mg Xr24h-cap (Diltiazem hcl er beads) .Marland Kitchen... Take 1 tablet by mouth once a day    Warfarin Sodium Tabs (Warfarin sodium tabs) .Marland Kitchen... Tablet strength: 2mg  take as directed  Complete Medication List: 1)  Diltiazem Hcl Er Beads 180 Mg  Xr24h-cap (Diltiazem hcl er beads) .... Take 1 tablet by mouth once a day 2)  Synthroid 125 Mcg Tabs (Levothyroxine sodium) .... Take 1 tablet by mouth once a day 3)  Enalapril Maleate 20 Mg Tabs (Enalapril maleate) .... Take 1 tablet by mouth once daily.  this is a decrease from twice daily 4)  Hydrochlorothiazide 25 Mg Tabs (Hydrochlorothiazide) .... Take 1 tablet by mouth once a day 5)  Ensure Nutra Shake Hi-cal Liqd (Nutritional supplements) .... One shake 1-2 times daily. 6)  Warfarin Sodium Tabs (Warfarin sodium tabs) .... Tablet strength: 2mg  take as directed 7)  Triamcinolone Acetonide 0.1 % Crea (Triamcinolone acetonide) .... Apply to affected area two times a day for 1 week 8)  Lorazepam  0.5 Mg Tabs (Lorazepam) .... Take 1 to 1/2 tab by mouth as neeeded for nerves 9)  Voltaren 1 % Gel (Diclofenac sodium) .... Apply to knee two times a day as directed  Other Orders: T-Vitamin B12 [id number]) T-Vitamin D (25-Hydroxy) [phone]) T-CBC w/Diff ([id number]) T-Comprehensive Metabolic Panel ([id number])  Patient Instructions: 1)  Please schedule a follow-up appointment in 1 month. Prescriptions: LORAZEPAM 0.5 MG TABS (LORAZEPAM) Take 1 to 1/2 tab by mouth as neeeded for nerves  #30 x 0   Entered and Authorized by:   Julaine Fusi  DO   Signed by:   Julaine Fusi  DO on 12/04/2009   Method used:   Printed then faxed to ...       [facility] (retail)       [address]       [place], [place]  [address]       Ph: [phone]       Fax: [phone]   RxID:   [id number] SYNTHROID 125 MCG TABS (LEVOTHYROXINE SODIUM) Take 1 tablet by mouth once a day  #30 x 3   Entered and Authorized by:   Julaine Fusi  DO   Signed by:   Julaine Fusi  DO on 12/04/2009   Method used:   Faxed to ...       [facility] (retail)       [address]       [place], [place]  [address]       Ph: [phone]       Fax: [phone]    RxID:   [id number] VOLTAREN 1 % GEL (DICLOFENAC SODIUM) apply to knee two times a day as directed  #1 tube x 3   Entered and Authorized by:   Julaine Fusi  DO   Signed by:   Julaine Fusi  DO on 12/04/2009   Method used:   Faxed to ...       [facility] (retail)       [address]       [place], [place]  [address]       Ph: [phone]       Fax: [phone]   RxID:   [id number] ENALAPRIL MALEATE 20 MG TABS (ENALAPRIL MALEATE) Take 1 tablet by mouth once daily.  This is a decrease from twice daily  #30 x 6   Entered and Authorized by:   Julaine Fusi  DO   Signed by:   Julaine Fusi  DO on 12/04/2009   Method used:   Faxed to ...       [facility] (retail)       [address]       [place], [place]  [address]       Ph: [phone]       Fax: [phone]   RxID:   [id number] HYDROCHLOROTHIAZIDE 25 MG TABS (HYDROCHLOROTHIAZIDE) Take 1 tablet by mouth once a day  #31 x 6   Entered and Authorized by:   Julaine Fusi  DO   Signed by:   Julaine Fusi  DO on 12/04/2009   Method used:   Faxed to ...       [facility] ([facility])       [address]  [place], [place]  [address]       Ph: [phone]       Fax: [phone]   RxID:   [id number]   Prevention & Chronic Care Immunizations   Influenza vaccine: Fluvax MCR  (07/07/2008)    Tetanus booster: Not documented    Pneumococcal vaccine: Not documented    H. zoster vaccine: Not documented  Colorectal Screening   Hemoccult: Not documented    Colonoscopy: Not documented  Other Screening   Pap smear: Not documented    Mammogram: Not documented    DXA bone density scan: Not documented   Smoking status: quit  (06/03/2009)  Lipids   Total Cholesterol: 155  (09/25/2008)   LDL: 91  (09/25/2008)   LDL Direct: Not documented   HDL: 52  (09/25/2008)   Triglycerides: 60  (09/25/2008)  Hypertension   Last  Blood Pressure: 148 / 94  (12/03/2009)   Serum creatinine: 0.92  (06/03/2009)   Serum potassium 4.0  (06/03/2009) CMP ordered   Self-Management Support :    Patient will work on the following items until the next clinic visit to reach self-care goals:     Medications and monitoring: take my medicines every day, bring all of my medications to every visit  (12/03/2009)     Eating: eat more vegetables, eat foods that are low in salt, eat baked foods instead of fried foods  (12/03/2009)     Activity: take a 30 minute walk every day  (12/03/2009)    Hypertension self-management support: Not documented  Process Orders Check Orders Results:     [facility]: Order checked:     [id number] -- T- * Misc. Laboratory test -- No CPT codes found (CPT: ) Tests Sent for requisitioning (December 04, 2009 3:20 PM):     12/03/2009: [facility] -- T- * Misc. Laboratory test [[id number]] (signed)     12/03/2009: [facility] -- T-Vitamin B12 [[id number]] (signed)     12/03/2009: [facility] -- T-Vitamin D (25-Hydroxy) [id number] (signed)     12/03/2009: [facility] -- T-CBC w/Diff [[id number]] (signed)     12/03/2009: [facility] -- T-Comprehensive Metabolic Panel [[id number]] (signed)     12/03/2009: [facility] -- T-TSH [phone] (signed)

## 2010-11-18 NOTE — Miscellaneous (Signed)
Summary: Medical Surgical Procedures  Medical Surgical Procedures   Imported By: Florinda Marker 12/10/2009 15:58:18  _____________________________________________________________________  External Attachment:    Type:   Image     Comment:   External Document

## 2010-11-18 NOTE — Progress Notes (Signed)
Summary: Refill/gh  Phone Note Refill Request Message from:  Fax from Pharmacy on July 05, 2010 11:16 AM  Refills Requested: Medication #1:  SYNTHROID 125 MCG TABS Take 1 tablet by mouth once a day   Last Refilled: 06/08/2010  Method Requested: Electronic Initial call taken by: Angelina Ok RN,  July 05, 2010 11:17 AM  Follow-up for Phone Call        Refill approved-nurse to complete Follow-up by: Julaine Fusi  DO,  July 06, 2010 5:48 PM    Prescriptions: SYNTHROID 125 MCG TABS (LEVOTHYROXINE SODIUM) Take 1 tablet by mouth once a day  #30 x 3   Entered and Authorized by:   Julaine Fusi  DO   Signed by:   Julaine Fusi  DO on 07/06/2010   Method used:   Faxed to ...       [facility] ([facility]ail)       [address]       [place], [place]  [address]       Ph: [phone]       Fax: [phone]   RxID:   [id number]

## 2010-11-18 NOTE — Assessment & Plan Note (Signed)
Summary: 1145, seeing dr Phillips Odor at 1100/pcp-[facility]/hla   Allergies: 1)  ! Penicillin   Vital Signs:  Patient profile:   77 year old female Height:      63 inches  Vitals Entered By: Angelina Ok RN (December 03, 2009 10:31 AM)   Vital Signs:  Patient profile:   77 year old female Height:      63 inches  Vitals Entered By: Angelina Ok RN (December 03, 2009 10:31 AM)    Prevention & Chronic Care Immunizations   Influenza vaccine: Fluvax MCR  (07/07/2008)    Tetanus booster: Not documented    Pneumococcal vaccine: Not documented    H. zoster vaccine: Not documented  Colorectal Screening   Hemoccult: Not documented    Colonoscopy: Not documented  Other Screening   Pap smear: Not documented    Mammogram: Not documented    DXA bone density scan: Not documented   Smoking status: quit  (06/03/2009)  Lipids   Total Cholesterol: 155  (09/25/2008)   LDL: 91  (09/25/2008)   LDL Direct: Not documented   HDL: 52  (09/25/2008)   Triglycerides: 60  (09/25/2008)  Hypertension   Last Blood Pressure: 161 / 80  (06/03/2009)   Serum creatinine: 0.92  (06/03/2009)   Serum potassium 4.0  (06/03/2009)  Self-Management Support :    Hypertension self-management support: Not documented

## 2010-11-18 NOTE — Progress Notes (Signed)
Summary: Soc. Work  Engineer, building services of Call: Judeth Cornfield from [facility] called me back and said that patient qualified for [facility] [employer] [facility] and she is on the wait list to be served.

## 2010-11-18 NOTE — Assessment & Plan Note (Signed)
Summary: Coumadin Clinic  Anticoagulant Therapy Managed by: Barbera Setters. Janie Morning  PharmD CACP Referring MD: Rollene Rotunda, MD PCP: Julaine Fusi  DO [facility] Attending: Julaine Fusi DO Indication 1: Atrial fibrillation Indication 2: Aftercare long term use Anticoagulants V58.61,V58.83 Start date: 12/02/2003 Duration: Indefinite  Patient Assessment Reviewed by: Chancy Milroy PharmD  December 03, 2009 Medication review: verified warfarin dosage & schedule,verified previous prescription medications, verified doses & any changes, verified new medications, reviewed OTC medications, reviewed OTC health products-vitamins supplements etc Complications: hemorrhagic Comments: Spontaneous epistaxis this morning--abated without pressure. Dietary changes: none   Health status changes: none   Lifestyle changes: none   Recent/future hospitalizations: none   Recent/future procedures: none   Recent/future dental: none Patient Assessment Part 2:  Have you MISSED ANY DOSES or CHANGED TABLETS?  No missed Warfarin doses or changed tablets.  Have you had any BRUISING or BLEEDING ( nose or gum bleeds,blood in urine or stool)?  YES. States she had spontaneous episode of epistaxis from right nares this morning. Stopped spontaneously.  Have you STARTED or STOPPED any MEDICATIONS, including OTC meds,herbals or supplements?  No other medications or herbal supplements were started or stopped.  Have you CHANGED your DIET, especially green vegetables,or ALCOHOL intake?  No changes in diet or alcohol intake.  Have you had any ILLNESSES or HOSPITALIZATIONS?  No reported illnesses or hospitalizations  Have you had any signs of CLOTTING?(chest discomfort,dizziness,shortness of breath,arms tingling,slurred speech,swelling or redness in leg)    No chest discomfort, dizziness, shortness of breath, tingling in arm, slurred speech, swelling, or redness in leg.     Treatment  Target INR: 2.0-3.0 INR: 1.8  Date:  12/03/2009 Regimen In:  32mg /wk INR reflects regimen in: 1.8  New  Tablet strength: : 2mg  Regimen Out:     Sunday: 2 Tablet     Monday: 3 Tablet     Tuesday: 2 Tablet     Wednesday: 3 Tablet     Thursday: 2 Tablet      Friday: 3 Tablet     Saturday: 2 Tablet Total Weekly: 34mg /wk mg  Next INR Due: 12/28/2009 Adjusted by: Barbera Setters. Alexandria Lodge III PharmD CACP   Return to anticoagulation clinic:  12/28/2009 Time of next visit: 1415    Allergies: 1)  ! Penicillin

## 2010-11-18 NOTE — Progress Notes (Signed)
Summary: phone/gg  Phone Note Call from Patient   Caller: Daughter Summary of Call: Pt daughter called stating Stori only took 2 days of bactrium for her UTI.  She was feeling better and didn't think she needed more.   I advised pt to take 5 more days of her antibiotics. Dr Phillips Odor aware. Initial call taken by: Merrie Roof RN,  May 25, 2010 9:54 AM

## 2010-11-24 ENCOUNTER — Telehealth: Payer: Self-pay | Admitting: *Deleted

## 2010-11-24 NOTE — Telephone Encounter (Signed)
RTC to pt's daughter.  Line bus

## 2010-11-29 ENCOUNTER — Telehealth: Payer: Self-pay | Admitting: Internal Medicine

## 2010-11-29 DIAGNOSIS — F0390 Unspecified dementia without behavioral disturbance: Secondary | ICD-10-CM

## 2010-11-29 DIAGNOSIS — D649 Anemia, unspecified: Secondary | ICD-10-CM

## 2010-11-29 DIAGNOSIS — N390 Urinary tract infection, site not specified: Secondary | ICD-10-CM

## 2010-11-29 NOTE — Telephone Encounter (Signed)
RTC from pt's daughter was unable to bring pt in on the offered date.  Will you have another available time?

## 2010-11-29 NOTE — Telephone Encounter (Signed)
I will call daughter and see what kind of issues are going on. Will look at my schedule and see when she can be put in.

## 2010-11-29 NOTE — Telephone Encounter (Signed)
Spoke with patient's daughter. I will add her on to my Thursday clinic around 1030, On arrival she will need a stat UA, micro sent to lab, CBC, CMET, VitB12. I will place future orders. Please add her to the schedule that day.

## 2010-11-30 NOTE — Telephone Encounter (Signed)
Spoke with her daughter- added her on for Thursday AM clinic.

## 2010-12-02 ENCOUNTER — Ambulatory Visit (INDEPENDENT_AMBULATORY_CARE_PROVIDER_SITE_OTHER): Payer: Medicare Other | Admitting: Internal Medicine

## 2010-12-02 ENCOUNTER — Encounter: Payer: Self-pay | Admitting: Internal Medicine

## 2010-12-02 DIAGNOSIS — H269 Unspecified cataract: Secondary | ICD-10-CM

## 2010-12-02 DIAGNOSIS — I10 Essential (primary) hypertension: Secondary | ICD-10-CM

## 2010-12-02 DIAGNOSIS — E039 Hypothyroidism, unspecified: Secondary | ICD-10-CM

## 2010-12-02 DIAGNOSIS — R32 Unspecified urinary incontinence: Secondary | ICD-10-CM

## 2010-12-02 DIAGNOSIS — R634 Abnormal weight loss: Secondary | ICD-10-CM

## 2010-12-02 DIAGNOSIS — D649 Anemia, unspecified: Secondary | ICD-10-CM

## 2010-12-02 DIAGNOSIS — I4891 Unspecified atrial fibrillation: Secondary | ICD-10-CM

## 2010-12-02 DIAGNOSIS — F411 Generalized anxiety disorder: Secondary | ICD-10-CM

## 2010-12-02 DIAGNOSIS — F0390 Unspecified dementia without behavioral disturbance: Secondary | ICD-10-CM

## 2010-12-02 DIAGNOSIS — N390 Urinary tract infection, site not specified: Secondary | ICD-10-CM

## 2010-12-02 DIAGNOSIS — H6120 Impacted cerumen, unspecified ear: Secondary | ICD-10-CM

## 2010-12-02 MED ORDER — DOCUSATE SODIUM 100 MG PO CAPS: 100.0000 mg | ORAL_CAPSULE | Freq: Two times a day (BID) | ORAL | Status: AC | PRN

## 2010-12-02 MED ORDER — ASPIRIN 81 MG PO TABS: 81.0000 mg | ORAL_TABLET | Freq: Every day | ORAL | Status: DC

## 2010-12-02 MED ORDER — LEVOTHYROXINE SODIUM 75 MCG PO TABS: 75.0000 ug | ORAL_TABLET | Freq: Every day | ORAL | Status: DC

## 2010-12-02 MED ORDER — DILTIAZEM HCL 180 MG PO CP24: 180.0000 mg | ORAL_CAPSULE | Freq: Every day | ORAL | Status: DC

## 2010-12-02 MED ORDER — TAMSULOSIN HCL 0.4 MG PO CAPS: 0.4000 mg | ORAL_CAPSULE | Freq: Every day | ORAL | Status: AC

## 2010-12-02 MED ORDER — SULFAMETHOXAZOLE-TRIMETHOPRIM 800-160 MG PO TABS: 1.0000 | ORAL_TABLET | Freq: Every day | ORAL | Status: AC

## 2010-12-02 MED ORDER — HYDROCHLOROTHIAZIDE 12.5 MG PO TABS: 12.5000 mg | ORAL_TABLET | Freq: Every day | ORAL | Status: AC

## 2010-12-02 MED ORDER — ENALAPRIL MALEATE 5 MG PO TABS: 5.0000 mg | ORAL_TABLET | Freq: Every day | ORAL | Status: AC

## 2010-12-03 ENCOUNTER — Telehealth: Payer: Self-pay | Admitting: Internal Medicine

## 2010-12-03 DIAGNOSIS — H6120 Impacted cerumen, unspecified ear: Secondary | ICD-10-CM | POA: Insufficient documentation

## 2010-12-03 DIAGNOSIS — N179 Acute kidney failure, unspecified: Secondary | ICD-10-CM | POA: Insufficient documentation

## 2010-12-03 LAB — DIFFERENTIAL
Eosinophils Absolute: 0.1 10*3/uL (ref 0.0–0.7)
Lymphocytes Relative: 21 % (ref 12–46)
Lymphs Abs: 1.1 10*3/uL (ref 0.7–4.0)
Neutro Abs: 3.7 10*3/uL (ref 1.7–7.7)
Neutrophils Relative %: 68 % (ref 43–77)

## 2010-12-03 LAB — URINALYSIS
Hgb urine dipstick: NEGATIVE
Leukocytes, UA: NEGATIVE
Nitrite: NEGATIVE
Specific Gravity, Urine: 1.025 (ref 1.005–1.030)
pH: 6 (ref 5.0–8.0)

## 2010-12-03 LAB — COMPREHENSIVE METABOLIC PANEL
AST: 35 U/L (ref 0–37)
Albumin: 4.3 g/dL (ref 3.5–5.2)
Alkaline Phosphatase: 53 U/L (ref 39–117)
BUN: 51 mg/dL — ABNORMAL HIGH (ref 6–23)
Glucose, Bld: 91 mg/dL (ref 70–99)
Potassium: 4.2 mEq/L (ref 3.5–5.3)
Sodium: 136 mEq/L (ref 135–145)
Total Bilirubin: 0.4 mg/dL (ref 0.3–1.2)
Total Protein: 6.6 g/dL (ref 6.0–8.3)

## 2010-12-03 LAB — CBC
Hemoglobin: 11.8 g/dL — ABNORMAL LOW (ref 12.0–15.0)
Platelets: 195 10*3/uL (ref 150–400)
RBC: 3.98 MIL/uL (ref 3.87–5.11)
WBC: 5.6 10*3/uL (ref 4.0–10.5)

## 2010-12-03 LAB — TSH: TSH: 4.591 u[IU]/mL — ABNORMAL HIGH (ref 0.350–4.500)

## 2010-12-03 LAB — T4: T4, Total: 9.8 ug/dL (ref 5.0–12.5)

## 2010-12-03 MED ORDER — INCONTINENCE SUPPLIES MISC: Status: DC

## 2010-12-03 MED ORDER — ENSURE PO LIQD: 1.0000 | Freq: Three times a day (TID) | ORAL | Status: AC

## 2010-12-03 MED ORDER — CARBAMIDE PEROXIDE 6.5 % OT SOLN: 5.0000 [drp] | Freq: Two times a day (BID) | OTIC | Status: DC

## 2010-12-03 NOTE — Assessment & Plan Note (Signed)
We'll check a TSH. 

## 2010-12-03 NOTE — Assessment & Plan Note (Signed)
The patient continues to have decreased weight. We have previously attributed this to poor nutrition habits. She was at home alone and was unable to prepare her own meals. She now has the service of meal delivery for Meals on Wheels. Her daughter reports that she eats well but despite this continues to lose weight.  She uses to ensure supplements daily.  Admitted stated the patient has decrease in peripheral edema. May attribute for some of the weight loss. We'll assess her volume status today and check a BMET.

## 2010-12-03 NOTE — Assessment & Plan Note (Signed)
The patient has adequate control of her hypertension. Today her blood pressure is lower than usual.  She's had an increase in urinary symptoms including frequency and dysuria. BMET today to make sure her renal function is normal.  Enalapril changed to 5 mg daily, hydrochlorothiazide changed 12.5 mg by mouth daily, will continue her diltiazem 180 mg daily for control of atrial fibrillation.

## 2010-12-03 NOTE — Assessment & Plan Note (Signed)
Physical exam today revealed large cataracts, reduced red-reflex and patient has had decreased vision especially in low light for several years. I will refer her to Opthalmology for evaluation.

## 2010-12-03 NOTE — Progress Notes (Signed)
  Subjective:    Patient ID: [name] [name], female    DOB: 1923/12/26, 77 y.o.   MRN: [id number]  HPI [name] [name] is in for routine f/u today. Multiple complaints include: 1. Urinary complaints: frequency alternating with decreased flow, persistent urine leakage and incontinence. Some pain with urinations 2. Memory worse, mild confusion. "Cant keep track of things" 3. Weight loss despite ensure supplements and meals on wheels. 4. Pain in bilateral knees due to OA.    Review of Systems  Constitutional: Positive for appetite change and unexpected weight change.  Eyes: Positive for visual disturbance. Negative for photophobia, pain, discharge, redness and itching.  Respiratory: Negative for cough, chest tightness and shortness of breath.   Cardiovascular: Negative for chest pain, palpitations and leg swelling.  Genitourinary: Positive for dysuria, frequency, decreased urine volume and difficulty urinating. Negative for pelvic pain.  Neurological: Negative for syncope, speech difficulty, weakness, numbness and headaches.  Psychiatric/Behavioral: Negative for behavioral problems, confusion, dysphoric mood, decreased concentration and agitation.       Objective:   Physical Exam  [nursing notereviewed. Constitutional: She is oriented to person, place, and time. She appears well-developed and well-nourished. No distress.  HENT:  Head: Normocephalic and atraumatic.  Eyes: EOM are normal. Pupils are equal, round, and reactive to light. Right conjunctiva is injected.  Fundoscopic exam:      The right eye shows red reflex.      The left eye shows red reflex. Cardiovascular: Normal rate, regular rhythm and normal heart sounds.   Pulmonary/Chest: Effort normal and breath sounds normal.  Abdominal: Soft. Bowel sounds are normal. She exhibits no distension. There is no tenderness.  Musculoskeletal: She exhibits no edema and no tenderness.  Neurological: She is alert and oriented to person,  place, and time.       Memory is intact for 3 item recall. She is AOx3. Follows commands and complex instructions. Mild executive function delay.  Skin: Skin is warm and dry. No rash noted. No erythema.  Psychiatric: She has a normal mood and affect. Her behavior is normal.          Assessment & Plan:

## 2010-12-03 NOTE — Assessment & Plan Note (Signed)
The patient discontinued the use of Coumadin last year. I have recommended that she take one 81 mg aspirin daily. The decision to stop Coumadin and was based on an increased risk for falls.  She completed a 30 day trial of Pradaxa, but had issues with insurance coverage.

## 2010-12-03 NOTE — Assessment & Plan Note (Signed)
Ms. Hanna is being followed by Dr. Jacquelyne Balint of [facility] for this ongoing problem. She had flow studies done and the nature of her incontinence is felt to be from bladder spasm. She was started on Toviaz about three months ago and experienced slight improvement in symptoms but has noticed increasing problems with memory loss and confusion. She also complains of dry mouth but reports not drinking fluids because she doesn't want to "wet" herself.   Today I had a discussion with both the patient and her daughter Gavin Pound about decisions regarding her urinary incontinence. This dramatically impacts Ms. Biancardi's life and she reports spending "all day dealing with loss of urine".   Plans for today:  1. D/C the Toviaz until she can be evaluated by Dr. Jacquelyne Balint again since she is having some issues with memory loss and confusion. 2. Will do a trial of Flomax for bladder spasm. Patient educated on medication dosing and side effects. 3. Will continue daily Bactrim for recurrent UTI prophylaxis. 4. Will place order incontinence supplies for Medicaid.  Will send note to urology office.

## 2010-12-03 NOTE — Assessment & Plan Note (Signed)
Serum creatinine is 2.2. This is likely due to dehydration. Patient is avoiding fluids due to her urinary incontinence

## 2010-12-03 NOTE — Assessment & Plan Note (Signed)
Cbc ordered today 

## 2010-12-03 NOTE — Telephone Encounter (Signed)
Problem of acute renal failure added.

## 2010-12-04 LAB — URINE CULTURE: Colony Count: 75000

## 2010-12-06 ENCOUNTER — Inpatient Hospital Stay (HOSPITAL_COMMUNITY): Payer: Medicare Other

## 2010-12-06 ENCOUNTER — Encounter (HOSPITAL_COMMUNITY): Payer: Self-pay

## 2010-12-06 ENCOUNTER — Encounter: Payer: Self-pay | Admitting: Internal Medicine

## 2010-12-06 ENCOUNTER — Inpatient Hospital Stay (HOSPITAL_COMMUNITY)
Admission: AD | Admit: 2010-12-06 | Discharge: 2010-12-10 | DRG: 690 | Disposition: A | Discharge status: Skilled Nursing Facility | Payer: Medicare Other | Source: Ambulatory Visit | Attending: Internal Medicine | Admitting: Internal Medicine

## 2010-12-06 ENCOUNTER — Other Ambulatory Visit: Payer: Medicare Other

## 2010-12-06 DIAGNOSIS — Z7982 Long term (current) use of aspirin: Secondary | ICD-10-CM

## 2010-12-06 DIAGNOSIS — D649 Anemia, unspecified: Secondary | ICD-10-CM | POA: Diagnosis present

## 2010-12-06 DIAGNOSIS — R32 Unspecified urinary incontinence: Secondary | ICD-10-CM | POA: Diagnosis present

## 2010-12-06 DIAGNOSIS — F05 Delirium due to known physiological condition: Secondary | ICD-10-CM | POA: Diagnosis present

## 2010-12-06 DIAGNOSIS — M1990 Unspecified osteoarthritis, unspecified site: Secondary | ICD-10-CM | POA: Diagnosis present

## 2010-12-06 DIAGNOSIS — N179 Acute kidney failure, unspecified: Secondary | ICD-10-CM

## 2010-12-06 DIAGNOSIS — E039 Hypothyroidism, unspecified: Secondary | ICD-10-CM | POA: Diagnosis present

## 2010-12-06 DIAGNOSIS — F341 Dysthymic disorder: Secondary | ICD-10-CM | POA: Diagnosis present

## 2010-12-06 DIAGNOSIS — N289 Disorder of kidney and ureter, unspecified: Secondary | ICD-10-CM | POA: Diagnosis present

## 2010-12-06 DIAGNOSIS — I10 Essential (primary) hypertension: Secondary | ICD-10-CM | POA: Diagnosis present

## 2010-12-06 DIAGNOSIS — Z8673 Personal history of transient ischemic attack (TIA), and cerebral infarction without residual deficits: Secondary | ICD-10-CM

## 2010-12-06 DIAGNOSIS — A498 Other bacterial infections of unspecified site: Secondary | ICD-10-CM | POA: Diagnosis present

## 2010-12-06 DIAGNOSIS — I4891 Unspecified atrial fibrillation: Secondary | ICD-10-CM | POA: Diagnosis present

## 2010-12-06 DIAGNOSIS — N3289 Other specified disorders of bladder: Secondary | ICD-10-CM | POA: Diagnosis present

## 2010-12-06 DIAGNOSIS — N390 Urinary tract infection, site not specified: Principal | ICD-10-CM | POA: Diagnosis present

## 2010-12-06 HISTORY — DX: Essential (primary) hypertension: I10

## 2010-12-06 HISTORY — DX: Unspecified atrial fibrillation: I48.91

## 2010-12-06 HISTORY — DX: Disorder of kidney and ureter, unspecified: N28.9

## 2010-12-06 LAB — DIFFERENTIAL
Basophils Absolute: 0 10*3/uL (ref 0.0–0.1)
Basophils Relative: 0 % (ref 0–1)
Monocytes Absolute: 0.8 10*3/uL (ref 0.1–1.0)
Neutro Abs: 4.8 10*3/uL (ref 1.7–7.7)
Neutrophils Relative %: 74 % (ref 43–77)

## 2010-12-06 LAB — BASIC METABOLIC PANEL
BUN: 58 mg/dL — ABNORMAL HIGH (ref 6–23)
CO2: 24 mEq/L (ref 19–32)
Glucose, Bld: 128 mg/dL — ABNORMAL HIGH (ref 70–99)
Potassium: 3.3 mEq/L — ABNORMAL LOW (ref 3.5–5.3)
Sodium: 136 mEq/L (ref 135–145)

## 2010-12-06 LAB — CBC
HCT: 33.8 % — ABNORMAL LOW (ref 36.0–46.0)
Hemoglobin: 11.5 g/dL — ABNORMAL LOW (ref 12.0–15.0)
MCH: 29.6 pg (ref 26.0–34.0)
MCHC: 34 g/dL (ref 30.0–36.0)

## 2010-12-06 NOTE — H&P (Signed)
Clinic Admission:  Ms. Hulsey came in today for follow-up lab only visit. I saw her Thursday 2/16 in clinic and lab work revealed acute renal failure. I re-evaluated her this afternoon. Over the weekend she sustained 2 falls, and has been increasingly more delirious according to her daughter Gavin Pound who is her Clinical research associate.   Active Issues and Reason for Admission:  1. Acute Renal Failure with hx of incontinence, recurrent UTI, bladder spasm 2. Hypotension 3. A-Fib rate controlled off coumadin or anticoagulation due to increased fall risk 4. Diarrhea, acute 5. Fall, Syncope: contusion on forehead 6. Delirium: worsening paranoia and agitation   I have discussed this patient with Drs. Threasa Beards and Claudette Laws who will complete admission H&P.

## 2010-12-07 LAB — URINALYSIS, ROUTINE W REFLEX MICROSCOPIC
Protein, ur: NEGATIVE mg/dL
Urine Glucose, Fasting: NEGATIVE mg/dL

## 2010-12-07 LAB — CARDIAC PANEL(CRET KIN+CKTOT+MB+TROPI)
CK, MB: 7 ng/mL (ref 0.3–4.0)
Relative Index: 4 — ABNORMAL HIGH (ref 0.0–2.5)
Total CK: 356 U/L — ABNORMAL HIGH (ref 7–177)
Total CK: 238 U/L — ABNORMAL HIGH (ref 7–177)
Troponin I: 0.05 ng/mL (ref 0.00–0.06)
Troponin I: 0.04 ng/mL (ref 0.00–0.06)

## 2010-12-07 LAB — BASIC METABOLIC PANEL
BUN: 52 mg/dL — ABNORMAL HIGH (ref 6–23)
Calcium: 8.4 mg/dL (ref 8.4–10.5)
Creatinine, Ser: 1.95 mg/dL — ABNORMAL HIGH (ref 0.4–1.2)
GFR calc Af Amer: 30 mL/min — ABNORMAL LOW (ref >60)
GFR calc non Af Amer: 24 mL/min — ABNORMAL LOW (ref >60)

## 2010-12-07 LAB — DIFFERENTIAL
Basophils Relative: 0 % (ref 0–1)
Eosinophils Absolute: 0.2 10*3/uL (ref 0.0–0.7)
Eosinophils Relative: 2 % (ref 0–5)
Monocytes Absolute: 0.9 10*3/uL (ref 0.1–1.0)
Monocytes Relative: 13 % — ABNORMAL HIGH (ref 3–12)
Neutrophils Relative %: 73 % (ref 43–77)

## 2010-12-07 LAB — CBC
MCH: 29.4 pg (ref 26.0–34.0)
MCHC: 33.8 g/dL (ref 30.0–36.0)
Platelets: 168 10*3/uL (ref 150–400)
RBC: 3.6 MIL/uL — ABNORMAL LOW (ref 3.87–5.11)
RDW: 13.6 % (ref 11.5–15.5)

## 2010-12-07 LAB — COMPREHENSIVE METABOLIC PANEL
Albumin: 2.9 g/dL — ABNORMAL LOW (ref 3.5–5.2)
Alkaline Phosphatase: 56 U/L (ref 39–117)
BUN: 60 mg/dL — ABNORMAL HIGH (ref 6–23)
Chloride: 99 mEq/L (ref 96–112)
Glucose, Bld: 125 mg/dL — ABNORMAL HIGH (ref 70–99)
Potassium: 3.4 mEq/L — ABNORMAL LOW (ref 3.5–5.1)
Total Bilirubin: 0.3 mg/dL (ref 0.3–1.2)

## 2010-12-07 LAB — URINE MICROSCOPIC-ADD ON

## 2010-12-07 LAB — CREATININE, URINE, RANDOM: Creatinine, Urine: 125.7 mg/dL

## 2010-12-08 DIAGNOSIS — N179 Acute kidney failure, unspecified: Secondary | ICD-10-CM

## 2010-12-08 LAB — BASIC METABOLIC PANEL
Chloride: 106 mEq/L (ref 96–112)
GFR calc Af Amer: 48 mL/min — ABNORMAL LOW (ref >60)
GFR calc non Af Amer: 39 mL/min — ABNORMAL LOW (ref >60)
Potassium: 4.1 mEq/L (ref 3.5–5.1)

## 2010-12-09 LAB — BASIC METABOLIC PANEL
CO2: 25 mEq/L (ref 19–32)
Calcium: 8.8 mg/dL (ref 8.4–10.5)
Chloride: 108 mEq/L (ref 96–112)
GFR calc Af Amer: 56 mL/min — ABNORMAL LOW (ref >60)
Glucose, Bld: 101 mg/dL — ABNORMAL HIGH (ref 70–99)
Sodium: 139 mEq/L (ref 135–145)

## 2010-12-09 LAB — VITAMIN B12: Vitamin B-12: 506 pg/mL (ref 211–911)

## 2010-12-09 LAB — URINE CULTURE: Colony Count: 40000

## 2010-12-09 LAB — FOLATE: Folate: 9 ng/mL

## 2010-12-09 LAB — IRON AND TIBC
Saturation Ratios: 13 % — ABNORMAL LOW (ref 20–55)
TIBC: 263 ug/dL (ref 250–470)

## 2010-12-09 LAB — FERRITIN: Ferritin: 51 ng/mL (ref 10–291)

## 2010-12-10 LAB — CBC
MCH: 30.1 pg (ref 26.0–34.0)
MCHC: 34.2 g/dL (ref 30.0–36.0)
MCV: 88 fL (ref 78.0–100.0)
Platelets: 251 10*3/uL (ref 150–400)
RBC: 3.92 MIL/uL (ref 3.87–5.11)
RDW: 13.6 % (ref 11.5–15.5)

## 2010-12-13 ENCOUNTER — Encounter: Payer: Self-pay | Admitting: Licensed Clinical Social Worker

## 2010-12-13 NOTE — Progress Notes (Signed)
  Subjective:    Patient ID: [name] [name], female    DOB: 1924-08-22, 77 y.o.   MRN: [id number]  HPI    Review of Systems     Objective:   Physical Exam        Assessment & Plan:

## 2010-12-13 NOTE — Progress Notes (Signed)
Patient D/C from inpatient on 12/10/10 and she was admitted to skilled nursing and may return home where we can then plan for inhome aide or other home health services.

## 2010-12-14 NOTE — Initial Assessments (Signed)
INTERNAL MEDICINE ADMISSION HISTORY AND PHYSICAL  Attending: Dr. Doneen Poisson First contact: Dr. Claudette Laws [phone] Second contact: Dr. Gilford Rile [phone] Jacquelyne Balint, after-hours: [phone], [phone])  PCP: Dr. Phillips Odor  CC:  AMS, increased creatinine, and fall  HPI:  Pt is a 77 y/o woman with PMH HTN, atrial fibrillation only on aspirin who presented to clinic on Feb 16 with her daughter complaining that the patient had continued urinary incontinence, worsening memory and confusion, weight loss (5 pounds in last 4 weeks), and bilateral knee pain.  Lab work was done at that visit which showed acute renal insufficiency with Cr. 2.2 (baseline  ~0.8-1.0), UTI of EColi resistant to the patient's prophylactic bactrim, and sub-optimally controlled hypothyroidism.  Patient presented to the clinic today for repeat BMet and this showed worsening renal function to Cr 2.8.  The patient's daughter, who accompanies her to most of her appointments, states that over the weekend the patient appeared to be doing worse.  The daughter has provided much of the history.  This weekend the patient fell twice, once hitting herself on the right brow bone. The patient denies any sensation of dizziness, pre-syncope, tripping prior to falling. Denies any injuries in the fall.  She does endorse genearlized weakness for the past few weeks. Per the daughter the patient has mentioned feeling her "heart beat in her chest" during the past few weeks.  Her BP medications have been adjusted down recently, and Dr. Phillips Odor instructed the patient to stop her HCTZ on Feb 16 and reduced her enalapril to 5mg  daily.  She also endorses a feeling of dry mouth and thirst but this has been a problem for some time as the patient avoids drinking fluids so as to not exacerbate her urinary incontinence in the night. Patient has been evaluated by urology and she was previously on Toviaz for 3 months but this was stopped on Feb 16 by Dr. Phillips Odor and flomax  started. The patient does have a history of urinary tract infections and endorses recent dysuria but denies any fevers, chills, or back pain.  Also the patient has complained of some recent incontinence of stool at night on top of her urinary incontinence.  She denies diarrhea or constipation, saying she has 1 soft, formed stool daily.  She denies any loss of appetite, though her daughter believes she is still losing weight.    ALLERGIES:  ! PENICILLIN - unknown reaction  PAST MEDICAL HISTORY: Anxiety Atrial fibrillation - rate controlled, for 15 years, off coumadin since Sept 2011, then predaxa for short period of time, but for last 3 months or so only on daily aspirin but taking intermittently Depression Hypertension Hypothyroidism Inguinal hernia repair 2011 Osteoarthritis Cerebrovascular accident, hx of - TIA 20 years ago, prior to diagnosis of Afib Degenerative joint disease Chronic renal insufficiency - baseline Cr 0.8-1.0 Recurrent urinary tract infections - h/o E.Coli resistant to Bactrim urinary incontinence 2/2 bladder spasm   MEDICATIONS: DILTIAZEM HCL ER BEADS 180 MG XR24H-CAP (DILTIAZEM HCL ER BEADS) Take 1 tablet by mouth once a day SYNTHROID 75 MCG TABS (LEVOTHYROXINE SODIUM) Take 1 tablet by mouth once a day [BMN] ENALAPRIL MALEATE 5 MG TABS (ENALAPRIL MALEATE) Take 1 tablet by mouth once daily. HYDROCHLOROTHIAZIDE 12.5 MG TABS (HYDROCHLOROTHIAZIDE) Take 1 tablet by mouth once a day ENSURE NUTRA SHAKE HI-CAL   LIQD (NUTRITIONAL SUPPLEMENTS) One shake 1-2 times daily. BACTRIM DS 800-160 MG TABS (SULFAMETHOXAZOLE-TRIMETHOPRIM) Take 1 tablet by mouth once daily - stopped 4 days ago COLACE 100 MG CAPS (DOCUSATE SODIUM)  Take 1 tablet by mouth two times a day BUFFERED ASPIRIN 81 MG TABS (ASPIRIN BUF(CACARB-MGCARB-MGO)) Take 1 tablet by mouth once a day - not really taking this flomax 0.4mg  by mouth daily   SOCIAL HISTORY: Lives alone next door to son. Daughter in [place]  visits often. She is basically independent in her living with some minimal help.   No Smoking hx. No alcohol, no drugs.   FAMILY HISTORY Family History: Family History of CAD Female 1st degree relative <60 Family History Hypertension Family History of Stroke F 1st degree relative <60   ROS: Otherwise negative, as per HPI  VITALS: T:  P:85  BP:109/59  R:16  O2SAT:  ON:  PHYSICAL EXAM: Gen: Patient is thin elderly woman in NAD, Pleasant. Eyes: PERRL, EOMI, No signs of anemia or jaundince. ENT:  dry mucus membranes; very poor dentition on bottom, edentulous on top, OP clear, No erythema, thrush or exudates. Neck: Supple, No JVD, No thyromegaly Resp: bibasilar crackles without wheezes or rhonchi CVS: irregularly irregular with no murmur GI: Abdomen is soft. ND, NT, NG, NR, BS+. No organomegaly.  Ext: No pedal edema, cyanosis or clubbing. GU: No CVA tenderness. Skin: approx 1.5cm non-bleeding midly tender abrasion over left eye; approx 1cm long bruised, non-bleeding skin tear on left anterior shin; skin turgor greatly decreased Lymph: No cervical lymphadenopathy. MS: Moving all 4 extremities. Neuro: A&O X3, CN II - XII are grossly intact. Motor strength is 5/5 in the all 4 distal extremities though weak in proximal hip muscles (pt could not stand without using her arms to pull herself up), Sensation intact to light touch, babinski mute, DTRs 3+ and symmetric.  Gait not able to be assessed as patient felt like the floor was too slippery to attempt to walk.  Psych: Appropriate   LABS: 2/16 (clinic) Na 136, K 4.2, Cl 99, Bicarb 27, BUN 51, Cr 2.2, glucose 91 UCx - 75K EColi R to bactrim and ampicillin, S to cephalosporins, ciprofloxacin TSH - 4.591  2/20 (clinic) Na 136, K 3.3, Cl 99, Bicarb 24, BUN 58, Cr 2.84, glucose 128  ASSESSMENT AND PLAN: (1) falls with acute mental status change:  Patient without appearant physical injury beside scrapes and bruises on physical exam;  neurologic exam is non-focal.  Pt does appear dry but is not subjectively dizzy on standing.  A+Ox3 but does not appear to understand why she is being hospitalized and per daughter she has been increasingly confused and paranoid recently.  Potential causes are dehydration leading to orthostasis, stroke given h/o Afib only on ASA, also UTI. - orthostatic vitals - IVF NS @100cc /hr x10h, then NSL - monitor in telemetry - EKG on floor - CE q8h x3 - CT head, CXR  (2) atrial fibrillation:  rate-controlled, though patient with reported sensation of palpitations recently.  CHADS2 score of 4 - preferable for patient to be on coumadin or other anticoagulation besides aspirin, however patient has resisted this in the past - monitoring on telemetry - continue aspirin daily - continue diltiazem at home dose  (3) crackles on lung exam:  Patient without history of CHF per history, no ECHO in our records. No complaints of SOB. - 2-view CXR - 2D ECHO - CE q8h x3  (4) acute renal failure: baseline Cr 0.8-1.0, now 2.8.  Patient appears dry and concern is for pre-renal renal insufficiency combined with ACE-I use. - U Na + Cr - u/s kidney and bladder - repeat BMet  (5) UTI:  75K EColi resistant to  Bactrim, ampicillin - ceftriaxone IV  (6) fecal incontinence: Will hold home colace and monitor  (7) HTN: Holding home antihypertensives in setting of #1 and soft BPs (baseline BPs appear to be systolic 130s)  (8) weight loss:  gradual over time, though weight stable  ~115 since May 2010; patient endorses good appetite and enjoying ensure supplementation.  - continue ensure supplementation  (9) VTE PROPH: SCDs

## 2010-12-20 NOTE — Discharge Summary (Signed)
NAMEJAZEL, [name] [name] NO.:  [id number]  MEDICAL RECORD NO.:  [id number]           PATIENT TYPE:  I  LOCATION:  6707                         FACILITY:  [facility]  PHYSICIAN:  Doneen Poisson, MD     DATE OF BIRTH:  1924-09-07  DATE OF ADMISSION:  12/06/2010 DATE OF DISCHARGE:  12/10/2010                              DISCHARGE SUMMARY   ATTENTION:  Edsel Petrin, DO at the [facility].  DISCHARGE DIAGNOSES: 1. Urinary tract infection. 2. Acute renal insufficiency. 3. Urinary incontinence thought secondary to bladder spasm. 4. Atrial fibrillation. 5. Anemia. 6. Hypothyroidism. 7. Hypertension. 8. Depression/anxiety. 9. Osteoarthritis. 10.History of transient ischemic attack 20 years ago prior to     diagnosis of atrial fibrillation.  DISCHARGE MEDICATIONS: 1. Tylenol 650 mg by mouth every 6 hours as needed for pain. 2. Ciprofloxacin 500 mg by mouth daily for 4 days. 3. Citalopram 10 mg by mouth daily. 4. Diltiazem ER 180 mg by mouth daily. 5. Ensure chocolate by mouth three times a day. 6. Ferrous sulfate 325 mg by mouth daily. 7. Synthroid 88 mcg by mouth daily before breakfast. 8. MiraLax 17 grams by mouth daily as needed for constipation. 9. Rivaroxaban 10 mg tablets take 15 mg by mouth daily for prevention     of stroke in the setting of nonvalvular atrial fibrillation. 10.Aspirin 81 mg by mouth daily. 11.Colace 100 mg by mouth twice daily as needed for constipation.  DISPOSITION AND FOLLOWUP:  [name] [name] was discharged from [facility] on December 10, 2010, in stable and improved condition.  She had achieved a baseline mental status and was to be discharged to a skilled nursing facility for short-term rehabilitation with the hope that she eventually will be able to return home.  She is to complete a total of 7-days of antibiotics for urinary tract infection.  She is also to be encouraged to continue to take  good oral intake, especially of fluids as this has been a problem for her in the past and contributed to this admission.  She will follow up with the physician at the skilled nursing facility.  At follow up with her doctor, she should have a repeat CBC and also measurement of her kidney function with BUN and creatinine.  CONSULTATIONS:  None.  PROCEDURES PERFORMED: 1. December 06, 2010, two-view chest x-ray showing no acute     cardiopulmonary abnormalities. 2. CT of the head without contrast on December 06, 2010, showing small-     vessel ischemic disease and brain atrophy, but no acute     intracranial abnormalities. 3. Renal ultrasound on December 06, 2010, showing unremarkable kidneys     without hydronephrosis.  ADMISSION HISTORY:  [name] [name] is a 77 year old woman with a past medical history of hypertension and atrial fibrillation, only on aspirin, who presented to the clinic on December 02, 2010, with her daughter complaining of continued urinary incontinence, worsening  memory, confusion, weight loss of 5 pounds in the last 4 weeks, and  bilateral knee pain.  Lab work was done at that visit, which showed  acute renal insufficiency with  a creatinine of 2.2 (her baseline  is approximately 0.8-1.0), an E. coli urinary tract infection  esistant to the prophylactic Bactrim and suboptimally controlled  hypothyroidism.  She presents to clinic today for repeat BMET which  showed worsening creatinine to 2.8.  Her daughter, who accompanies her to most of her appointments, states that over the weekend she appeared to be doing worse.  The daughter provided much of the history.  This weekend, she fell twice, once hitting herself on the right brow bone.  She denies any sensation of dizziness, presyncope, or tripping prior to falling.  She denies any injuries in the fall.  She does endorse generalized weakness for the past few weeks. Per the daughter, she has mentioned feeling her  "heartbeat in her chest" during the past few weeks.  Her blood pressure medicines have been adjusted down recently and Dr. Phillips Odor instructed her to stop her hydrochlorothiazide on December 02, 2010.  She also endorses  a feeling of dry mouth and thirst.  This has been a problem for  sometime as she avoids drinking fluids so not exacerbate her  urinary incontinence at night.  She has been evaluated by Urology  and was previously on Toviaz for 3 months, but this was stopped on  December 02, 2010, by Dr. Phillips Odor and Flomax was started.  She does  have a history of urinary tract infections and endorses recent dysuria,  but denies any fevers, chills, or back pain.  Also, she complains of  some recent incontinence of stool at night on top of her urinary incontinence.  She denies any diarrhea or constipation saying she has one soft formed stool daily.  She denies any loss of appetite, though her daughter believes she is still losing weight.  PHYSICAL EXAMINATION: VITAL SIGNS:  Temperature 97.5, pulse 96, blood pressure 108/70,  respiratory rate 18, O2 sat 97% on room air. GENERAL:  Thin elderly woman, in no acute distress, pleasant. HEENT:  Pupils equal, round, and reactive to light.  Extraocular movements intact.  No signs of anemia or jaundice.  ENT shows dry mucous membranes.  Very poor dentition on the bottom and edentulous on the top.  Oropharynx is clear.  No erythema, thrush, or exudates. NECK:  Supple.  No JVD or thyromegaly. RESPIRATORY:  Bibasilar crackles without wheezes or rhonchi. CARDIOVASCULAR:  Irregularly irregular with no murmurs. GI:  Soft, nondistended, nontender without guarding or rebound.  Positive bowel sounds.  No organomegaly. EXTREMITIES:  No pedal edema, cyanosis, or clubbing. GU:  No CVA tenderness. SKIN:  Approximately 1.5 cm nonbleeding, mildly tender abrasion  over the left eye and an approximately 1 cm long bruise, nonbleeding  skin tear on the left  anterior shin.  Skin turgor is greatly decreased.   Approximately 5 cm diameter blanching erythematous lesion over the  sacrum with no evidence of skin breakdown. LIMBS:  No cervical lymphadenopathy. MUSCULOSKELETAL:  Moving all 4 extremities. NEUROLOGIC:  Alert and oriented x 3.  Cranial nerves II-XII are grossly intact.  Motor strength 5/5 in all 4 distal extremities, though appears weak in the proximal hip muscles (she could not stand without using her arms or pull herself up and was very weak when standing, falling immediately back down onto the bed).  Sensation intact to light touch. Babinski is mute.  Deep tendon reflexes were 3+ and symmetric.  Gait was unable to be assessed, as she was very unsteady and endorsed feeling  that the floor was too slippery to attempt to walk.  PSYCHIATRIC:  Appropriate.  LABORATORY DATA:  Labs from December 02, 2010: Sodium 136, potassium 4.2,  chloride 99, bicarb 27, BUN 51, creatinine 2.2, glucose 91.  Urine  culture 75,000 E. coli, resistant to Bactrim and ampicillin but sensitive  to cephalosporins and ciprofloxacin.  TSH 4.591.  Labs from December 06, 2010: Sodium 136, potassium 3.3, chloride 99, bicarb 24, BUN 58,  creatinine 2.84, glucose 128, total bilirubin 0.3, alk phos 56, AST 42, ALT 37, total protein 5.7, albumin 2.9, calcium 9.1, creatinine kinase  3.56, CK-MB 10.5, troponin 0.05.  HOSPITAL COURSE: 1. Falls with acute mental status changes.  [name] [name] had reported     two falls with acute mental status changes per her daughter on     admission.  These were thought likely secondary to an active urinary     tract infection and severe dehydration, also a baseline decline in     the patient's mental functioning and generalized physical     deconditioning.  Fortunately, she did not have evidence     of gross physical injury outside of a skin tear on her lower right     leg and abrasion over her eye and CT of her head was negative for      any acute bleed.  Her urinary tract infection was treated as     outlined below.  She was rehydrated and her diuretic     medications all held to address her dehydration.  She was evaluated     by Physical and Occupational Therapy and found her to have very poor     functioning with extreme instability.  She demonstrated poor     insight into her illness and physical condition and demonstrated     the inability to make appropriate disposition decisions for her     self, therefore her disposition was discussed with her daughter.     It was decided that she would benefit from skilled nursing     facility rehabilitation and this was arranged.  The     goal is that she would eventually be able to return     home; however, she has demonstrated some baseline decline in mental     functioning and may have a new mental baseline in which case     she will likely require 24-hour assistance if she is to return     home after rehabilitation at the skilled nursing facility.  2. Acute renal insufficiency.  [name] [name] was admitted with a     creatinine of 2.8 thought to be secondary to prerenal azotemia     secondary to dehydration.  On discharge, her creatinine improved to     1.1, which is very close to her baseline.  This was accomplished by     hydration with IV fluids, holding her diuretic medications     and encouraging good oral intake.  She had likely been     dehydrated for some time, as she is wary to drink sufficient fluids     because it worsens her urinary incontinence.  She will continue to     need encouragement to maintain adequate fluid hydration on     discharge.  3. Urinary tract infection.  [name] [name] has a history of recurrent     urinary tract infection and was on prophylactic Bactrim prior to     admission; however, her E. coli that grew out from the clinic,     laboratory result was resistant  to Bactrim, but sensitive to     ciprofloxacin and cephalosporins.  She does have  a     penicillin reaction per her daughter.  We do not know the actual     reaction she had in the distant past to this.  She was initially on     Rocephin, which was switched to ciprofloxacin.  On discharge, she      is going to complete a 7-day course of antibiotics.  4. Urinary incontinence.  This is longstanding and she has been evaluated      by Urology as an outpatient.  Her urinary incontinence is thought      secondary to bladder spasm and she has failed medical management in      the past.  On admission, there was no evidence of distention of the      bladder suggesting overflow incontinence.  She remained incontinent      of urine throughout the hospitalization with poor ability to recognize      when she had to urinate.  One contributing problem was she was on      diuretics prior to admission.  These were stopped.  Unfortunately,      there was not great improvement in her urinary incontinence, though      there was improvement in her fluid status.  5. Atrial fibrillation.  [name] [name] is rate controlled on diltiazem     and only taking aspirin for prevention of stroke.  She has a CHADS2     score of 4 putting her at approximately 8% risk of stroke per year.     She was on Coumadin until September 2011 when monitoring became      difficult as an outpatient.  It was thought that her risk of     stroke was greater than her risk of spontaneous intracranial     hemorrhage despite being weak and a fall risk. These risks were      discussed with her daughter who wished [name] [name] be placed on      anticoagulation.  Therefore, she was started on rivaroxaban for      stroke prevention and nonvalvular atrial fibrillation.  We hope      that she is able to get stronger with physical therapy.  She     will require close monitoring and continue to work on her balance.  6. Anemia.  On admission, her hemoglobin was 11.5.  On     discharge, her hemoglobin was approximately 11.8.  Her  baseline     hemoglobin is approximately 11 in the outpatient setting.  An      anemia panel was done and showed that she had borderline low     ferritin levels with normal total iron binding capacity.  She     has a longstanding history of constipation and known hemorrhoids.       Nursing noted that she had bright red blood on the tissue paper      after bowel movement, which was quite hard. The stool was      positive for blood.  She was discharged with p.r.n.     orders for MiraLax and Colace to try to help with her intermittent     constipation.  However, it would be prudent as an outpatient to     have a repeat CBC in a few months to ensure that she has remained     stable.  7. Hypothyroidism.  [name] [name] had a TSH, which showed undertreated     hypothyroidism (TSH 8.567).  Her home Synthroid dose was     increased from 75 mcg daily to 88 mcg daily.  She should have     a repeat TSH in approximately 2 months.  8. Peripheral edema.  [name] [name] has a history of peripheral edema,     though given her volume depletion on admission, she had none.  Her     diuretics were stopped and she did not develop any signs of     peripheral edema.  If it does recur, we would recommend she     try TED hose in lieu of using diuretics.  9. Hypertension.  [name] [name] remained low normotensive during the     beginning of the admission and then normotensive on her home     regimen minus diuretic medications.  10. Depression/Anxiety:  Per Dr. Phillips Odor and [name] [name]'s daughter,      she has been struggling with depression for some time but has      refused medication.  She demonstrated depressed mood with anxiety      during the hospitalization and we thought this contributed to      her decline in mental status, therefore we started her on low-dose      citalopram which may be titrated up to 20mg  PO daily if no effect      is seen within the next 4-6 weeks.  DISCHARGE VITALS:  Temperature 97.1,  pulse 74, blood pressure 140/82, respirations 16, O2 sat 97% on room air.  DISCHARGE LABS:  White blood cell count 6.9, hemoglobin 11.8, hematocrit 34.5, platelets 251.  FOBT positive.  Iron 34, total iron binding capacity 263, percent saturation 13, vitamin B12 506, folate 9, ferritin 51.  Sodium 139, potassium 4.6, chloride 108, bicarb 25, BUN 27, creatinine 1.13, glucose 101.  Urine culture 40,000 colonies of E. coli, sensitive to ampicillin, cefazolin, ceftriaxone, ciprofloxacin, gentamicin, levofloxacin, nitrofurantoin, Tobramycin, and Bactrim.   ______________________________ Danelle Berry, MD   ______________________________ Doneen Poisson, MD   JW/MEDQ  D:  12/10/2010  T:  12/10/2010  Job:  [id number]  cc:   Doneen Poisson, MD  Electronically Signed by Danelle Berry MD on 12/10/2010 07:53:35 PM Electronically Signed by Doneen Poisson  on 12/20/2010 05:56:09 PM

## 2010-12-23 ENCOUNTER — Ambulatory Visit: Payer: Medicare Other | Admitting: Physician Assistant

## 2010-12-23 ENCOUNTER — Encounter (INDEPENDENT_AMBULATORY_CARE_PROVIDER_SITE_OTHER): Payer: Self-pay | Admitting: *Deleted

## 2010-12-28 NOTE — Letter (Signed)
Summary: New Patient letter  [facility]  [address] [place], [place] [address]   Phone: [phone]  Fax: [phone]       12/23/2010 MRN: [id number]  [facility] [address] [facility] [place], [place]  [address]  [place]  Dear [name] [name],  Welcome to the Gastroenterology Division at [facility].    You are scheduled to see Dr. Yancey Flemings on February 01, 2011 at 9:30am on the 3rd floor at [facility], [address].  We ask that you try to arrive at our office 15 minutes prior to your appointment time to allow for check-in.  We would like you to complete the enclosed self-administered evaluation form prior to your visit and bring it with you on the day of your appointment.  We will review it with you.  Also, please bring a complete list of all your medications or, if you prefer, bring the medication bottles and we will list them.  Please bring your insurance card so that we may make a copy of it.  If your insurance requires a referral to see a specialist, please bring your referral form from your primary care physician.  Co-payments are due at the time of your visit and may be paid by cash, check or credit card.     Your office visit will consist of a consult with your physician (includes a physical exam), any laboratory testing he/she may order, scheduling of any necessary diagnostic testing (e.g. x-ray, ultrasound, CT-scan), and scheduling of a procedure (e.g. Endoscopy, Colonoscopy) if required.  Please allow enough time on your schedule to allow for any/all of these possibilities.    If you cannot keep your appointment, please call [phone] to cancel or reschedule prior to your appointment date.  This allows [place] the opportunity to schedule an appointment for another patient in need of care.  If you do not cancel or reschedule by 5 p.m. the business day prior to your appointment date, you will be charged a $50.00 late cancellation/no-show fee.    Thank you  for choosing [facility] for your medical needs.  We appreciate the opportunity to care for you.  Please visit [place] at our website  to learn more about our practice.                     Sincerely,                                                             The Gastroenterology Division

## 2011-01-19 LAB — BASIC METABOLIC PANEL
BUN: 16 mg/dL (ref 6–23)
Calcium: 9.8 mg/dL (ref 8.4–10.5)
Chloride: 100 mEq/L (ref 96–112)
Creatinine, Ser: 1.17 mg/dL (ref 0.4–1.2)
GFR calc Af Amer: 53 mL/min — ABNORMAL LOW (ref >60)
GFR calc non Af Amer: 44 mL/min — ABNORMAL LOW (ref >60)

## 2011-01-19 LAB — DIFFERENTIAL
Basophils Relative: 1 % (ref 0–1)
Lymphocytes Relative: 12 % (ref 12–46)
Lymphs Abs: 0.9 10*3/uL (ref 0.7–4.0)
Monocytes Absolute: 0.6 10*3/uL (ref 0.1–1.0)
Monocytes Relative: 8 % (ref 3–12)
Neutro Abs: 5.9 10*3/uL (ref 1.7–7.7)

## 2011-01-19 LAB — CBC
Hemoglobin: 12.9 g/dL (ref 12.0–15.0)
MCHC: 34.6 g/dL (ref 30.0–36.0)
RBC: 4.18 MIL/uL (ref 3.87–5.11)
WBC: 7.5 10*3/uL (ref 4.0–10.5)

## 2011-01-19 LAB — PROTIME-INR: INR: 1.26 (ref 0.00–1.49)

## 2011-01-20 ENCOUNTER — Inpatient Hospital Stay (HOSPITAL_COMMUNITY)
Admission: EM | Admit: 2011-01-20 | Discharge: 2011-02-04 | DRG: 871 | Disposition: A | Discharge status: Skilled Nursing Facility | Payer: Medicare Other | Attending: Infectious Diseases | Admitting: Infectious Diseases

## 2011-01-20 ENCOUNTER — Encounter: Payer: Self-pay | Admitting: Internal Medicine

## 2011-01-20 ENCOUNTER — Emergency Department (HOSPITAL_COMMUNITY): Payer: Medicare Other

## 2011-01-20 DIAGNOSIS — D649 Anemia, unspecified: Secondary | ICD-10-CM | POA: Diagnosis not present

## 2011-01-20 DIAGNOSIS — I959 Hypotension, unspecified: Secondary | ICD-10-CM | POA: Diagnosis present

## 2011-01-20 DIAGNOSIS — Z515 Encounter for palliative care: Secondary | ICD-10-CM

## 2011-01-20 DIAGNOSIS — F411 Generalized anxiety disorder: Secondary | ICD-10-CM | POA: Diagnosis present

## 2011-01-20 DIAGNOSIS — J90 Pleural effusion, not elsewhere classified: Secondary | ICD-10-CM | POA: Diagnosis present

## 2011-01-20 DIAGNOSIS — G929 Unspecified toxic encephalopathy: Secondary | ICD-10-CM | POA: Diagnosis present

## 2011-01-20 DIAGNOSIS — E46 Unspecified protein-calorie malnutrition: Secondary | ICD-10-CM

## 2011-01-20 DIAGNOSIS — K922 Gastrointestinal hemorrhage, unspecified: Secondary | ICD-10-CM | POA: Diagnosis present

## 2011-01-20 DIAGNOSIS — A0472 Enterocolitis due to Clostridium difficile, not specified as recurrent: Secondary | ICD-10-CM

## 2011-01-20 DIAGNOSIS — I10 Essential (primary) hypertension: Secondary | ICD-10-CM | POA: Diagnosis present

## 2011-01-20 DIAGNOSIS — E039 Hypothyroidism, unspecified: Secondary | ICD-10-CM | POA: Diagnosis present

## 2011-01-20 DIAGNOSIS — R195 Other fecal abnormalities: Secondary | ICD-10-CM

## 2011-01-20 DIAGNOSIS — M1990 Unspecified osteoarthritis, unspecified site: Secondary | ICD-10-CM | POA: Diagnosis present

## 2011-01-20 DIAGNOSIS — N179 Acute kidney failure, unspecified: Secondary | ICD-10-CM | POA: Diagnosis present

## 2011-01-20 DIAGNOSIS — R627 Adult failure to thrive: Secondary | ICD-10-CM

## 2011-01-20 DIAGNOSIS — J9819 Other pulmonary collapse: Secondary | ICD-10-CM | POA: Diagnosis present

## 2011-01-20 DIAGNOSIS — Z8673 Personal history of transient ischemic attack (TIA), and cerebral infarction without residual deficits: Secondary | ICD-10-CM

## 2011-01-20 DIAGNOSIS — G9340 Encephalopathy, unspecified: Secondary | ICD-10-CM

## 2011-01-20 DIAGNOSIS — R188 Other ascites: Secondary | ICD-10-CM | POA: Diagnosis present

## 2011-01-20 DIAGNOSIS — G92 Toxic encephalopathy: Secondary | ICD-10-CM | POA: Diagnosis present

## 2011-01-20 DIAGNOSIS — R0602 Shortness of breath: Secondary | ICD-10-CM | POA: Diagnosis present

## 2011-01-20 DIAGNOSIS — K649 Unspecified hemorrhoids: Secondary | ICD-10-CM | POA: Diagnosis present

## 2011-01-20 DIAGNOSIS — IMO0002 Reserved for concepts with insufficient information to code with codable children: Secondary | ICD-10-CM

## 2011-01-20 DIAGNOSIS — A419 Sepsis, unspecified organism: Principal | ICD-10-CM | POA: Diagnosis present

## 2011-01-20 DIAGNOSIS — R5381 Other malaise: Secondary | ICD-10-CM | POA: Diagnosis present

## 2011-01-20 DIAGNOSIS — Z880 Allergy status to penicillin: Secondary | ICD-10-CM

## 2011-01-20 DIAGNOSIS — D72829 Elevated white blood cell count, unspecified: Secondary | ICD-10-CM | POA: Diagnosis present

## 2011-01-20 DIAGNOSIS — K5732 Diverticulitis of large intestine without perforation or abscess without bleeding: Secondary | ICD-10-CM | POA: Diagnosis present

## 2011-01-20 DIAGNOSIS — K219 Gastro-esophageal reflux disease without esophagitis: Secondary | ICD-10-CM | POA: Diagnosis present

## 2011-01-20 DIAGNOSIS — N390 Urinary tract infection, site not specified: Secondary | ICD-10-CM | POA: Diagnosis present

## 2011-01-20 DIAGNOSIS — F3289 Other specified depressive episodes: Secondary | ICD-10-CM | POA: Diagnosis present

## 2011-01-20 DIAGNOSIS — E871 Hypo-osmolality and hyponatremia: Secondary | ICD-10-CM | POA: Diagnosis present

## 2011-01-20 DIAGNOSIS — I4891 Unspecified atrial fibrillation: Secondary | ICD-10-CM | POA: Diagnosis present

## 2011-01-20 DIAGNOSIS — Z66 Do not resuscitate: Secondary | ICD-10-CM | POA: Diagnosis present

## 2011-01-20 DIAGNOSIS — K449 Diaphragmatic hernia without obstruction or gangrene: Secondary | ICD-10-CM | POA: Diagnosis present

## 2011-01-20 DIAGNOSIS — F329 Major depressive disorder, single episode, unspecified: Secondary | ICD-10-CM | POA: Diagnosis present

## 2011-01-20 DIAGNOSIS — R609 Edema, unspecified: Secondary | ICD-10-CM | POA: Diagnosis present

## 2011-01-20 LAB — CBC
HCT: 37.9 % (ref 36.0–46.0)
Hemoglobin: 13 g/dL (ref 12.0–15.0)
MCV: 82 fL (ref 78.0–100.0)
RBC: 4.62 MIL/uL (ref 3.87–5.11)
WBC: 30.1 10*3/uL — ABNORMAL HIGH (ref 4.0–10.5)

## 2011-01-20 LAB — URINALYSIS, ROUTINE W REFLEX MICROSCOPIC
Hgb urine dipstick: NEGATIVE
Specific Gravity, Urine: 1.016 (ref 1.005–1.030)
Urobilinogen, UA: 0.2 mg/dL (ref 0.0–1.0)
pH: 5 (ref 5.0–8.0)

## 2011-01-20 LAB — BASIC METABOLIC PANEL
BUN: 51 mg/dL — ABNORMAL HIGH (ref 6–23)
CO2: 18 mEq/L — ABNORMAL LOW (ref 19–32)
Calcium: 8.1 mg/dL — ABNORMAL LOW (ref 8.4–10.5)
Chloride: 92 mEq/L — ABNORMAL LOW (ref 96–112)
Creatinine, Ser: 1.75 mg/dL — ABNORMAL HIGH (ref 0.4–1.2)
GFR calc Af Amer: 34 mL/min — ABNORMAL LOW (ref >60)

## 2011-01-20 LAB — URINE MICROSCOPIC-ADD ON

## 2011-01-20 LAB — AMYLASE: Amylase: 7 U/L (ref 0–105)

## 2011-01-20 LAB — DIFFERENTIAL
Basophils Relative: 0 % (ref 0–1)
Eosinophils Relative: 0 % (ref 0–5)
Lymphocytes Relative: 4 % — ABNORMAL LOW (ref 12–46)
Monocytes Relative: 5 % (ref 3–12)
Neutrophils Relative %: 91 % — ABNORMAL HIGH (ref 43–77)

## 2011-01-20 LAB — PROLACTIN: Prolactin: 20.8 ng/mL

## 2011-01-20 LAB — CREATININE, URINE, RANDOM: Creatinine, Urine: 107.4 mg/dL

## 2011-01-20 NOTE — H&P (Signed)
Hospital Admission Note Date: 01/20/2011  Patient name: [name] [name] Medical record number: [id number] Date of birth: 1924/01/08 Age: 77 y.o. Gender: female PCP: Anderson Malta, DO  Place in Progress Notes section of Chart  Attending physician:  Dr. Maurice March     Resident (R2/R3): Dr. Scot Dock    Pager: [phone] Resident (R1): Dr. Tonny Branch     Pager: [phone] Chief Complaint:  History of Present Illness:77 yo woman with PMH of atrial fib rate controlled, hypothyroidism, HTN, depression, iron deficiency anemia, OA, TIA~20 years ago who was recently admitted in feb (2/20-2/24) for UTI and was treated with Cipro and acute renal failure presents for diarrhea, nausea, and altered mental status.  The history was partially given by patient and her daughter.  After last hospital admission, patient went to [facility] for rehabilitation and she started having diarrhea (approximately 5 per days) and nausea x 1 month in duration.  Patient was diagnosed with C. Diff and was treated with 7 days of Vancomycin, Flagyl, and Florastor.  She was also given Immodium which initially decreased her diarrhea to 2-3 times per day but this ultimately stopped 2/2 lack of significant benefit.  She also has a decrease in appetite.  Her symptoms seem to be worsen about 1 week prior to admission when her son noticed that she appear confused and does not know where she is at.  At the nursing home, her labs (01/20/11)showed WBC of 26.2, Na 123, BUN 55, and Cr 1.54. She also had stool culture on 3/23 which was neg as well as a neg Abdomen Xray on 12/26/10.   She admits to diarrhea and abdominal pain but denies any other complaint.    Current Outpatient Prescriptions  Medication Sig Dispense Refill  . aspirin 81 MG tablet Take 1 tablet (81 mg total) by mouth daily.  30 tablet  11  . carbamide peroxide (DEBROX) 6.5 % otic solution Place 5 drops into the right ear 2 (two) times daily.  15 mL  2  . diltiazem (DILACOR XR) 180 MG 24 hr  capsule Take 1 capsule (180 mg total) by mouth daily.  30 capsule  11  . Incontinence Supplies MISC Use as directed.  Please provide 100 Briefs and 100 chux pads. Dx: Urinary Incontinence, Mixed; Bladder Spam 788.30   100 each  prn  . levothyroxine (SYNTHROID) 75 MCG tablet Take 1 tablet (75 mcg total) by mouth daily.  30 tablet  11    Allergies: Penicillins (?)  PMH:  Urinary incontinence thought secondary to bladder spasm.  Atrial fibrillation Anemia    - Baseline Hgb 11.0 Hypothyroidism.  Hypertension.  Depression/anxiety.  Osteoarthritis.  Hx of C. Diff colitis (March, 2012) Hx of of transient ischemic attack 20 years ago prior to       diagnosis of atrial fibrillation.  Hx of urinary tract infection  - Bactrim resistant E. coli.  Hx of acute renal insufficiency   No past surgical history on file.  SOCIAL HISTORY: Divorced.  Lives at [facility]. 2 healthy children. Daughter in [place] and [place] visits often.  Former snuff user, quit in 1962. No alcohol, no drugs.   FAMILY HISTORY Family History: Family History of CAD Female 1st degree relative <60 Family History Hypertension Family History of Stroke F 1st degree relative <60   Review of Systems: Review of systems not obtained due to patient factors. Pt unable to remember any symptoms.  Physical Exam: Vitals:  Temp: 94.8>>97.1   HR: 86   RR: 16   BP:  126/58>>>99/70  O2: 99% on 2L [place] GEN: pleasant 77 y/o F. NAD.  Alert to person, place, but not time. HEENT: normocephalic, atraumatic.EOMI.  PERRL. Anicteric.  Neck supple, no masses or LAD. MM dry.  + small white patchy exudates on buccal membranes. RESP: CTAB. CV: irregular rhythm, regular rate.  No M/G/R ABD: soft, tympanic.  Diffusely tender to palpation, greatest in RUQ/RLQ.  No guarding or rebound. EXT: Warm and dry.  Diminished DP, PT pulses in bilateral LE.  + venous stasis changes present bilateral LEs. SKIN: decreased turgor. mild erythema and breakdown of skin  along mid-sacrum. NEURO: Alert and oriented x 2.  Short term memory impairment noted.  Appropriately conversant and coherent.  Sensation grossly intact.  +3 muscle strength globally.  CN 2-12 grossly intact.   Lab results:  WBC                                      30.1       h      4.0-10.5         K/uL  RBC                                      4.62              3.87-5.11        MIL/uL  Hemoglobin (HGB)                         13.0              12.0-15.0        g/dL  Hematocrit (HCT)                         37.9              36.0-46.0        %  MCV                                      82.0              78.0-100.0       fL  MCH -                                    28.1              26.0-34.0        pg  MCHC                                     34.3              30.0-36.0        g/dL  RDW                                      15.2              11.5-15.5        %  Platelet Count (PLT)                     392               150-400          K/uL  Neutrophils, %                           91         h      43-77            %  Lymphocytes, %                           4          l      12-46            %  Monocytes, %                             5                 3-12             %  Eosinophils, %                           0                 0-5              %  Basophils, %                             0                 0-1              %  Neutrophils, Absolute                    27.4       h      1.7-7.7          K/uL  Lymphocytes, Absolute                    1.2               0.7-4.0          K/uL  Monocytes, Absolute                      1.5        h      0.1-1.0          K/uL  Eosinophils, Absolute                    0.0               0.0-0.7          K/uL  Basophils, Absolute                      0.0               0.0-0.1          K/uL  WBC Morphology  SEE NOTE.    TOXIC GRANULATION   Lactic Acid, Venous                      1.8               0.5-2.2          Mmol/L    Sodium (NA)                              120        l      135-145          mEq/L  Potassium (K)                            4.6               3.5-5.1          mEq/L  Chloride                                 92         l      96-112           mEq/L  CO2                                      18         l      19-32            mEq/L  Glucose                                  97                70-99            mg/dL  BUN                                      51         h      6-23             mg/dL  Creatinine                               1.75       h      0.4-1.2          mg/dL  GFR, Est Non African American            28         l      >60              mL/min  GFR, Est African American                34         l      >60              mL/min    Oversized comment, see footnote  1  Calcium  8.1        l      8.4-10.5         Mg/dL   Color, Urine                             YELLOW            YELLOW  Appearance                               CLOUDY     a      CLEAR  Specific Gravity                         1.016             1.005-1.030  pH                                       5.0               5.0-8.0  Urine Glucose                            NEGATIVE          NEG              mg/dL  Bilirubin                                SMALL      a      NEG  Ketones                                  NEGATIVE          NEG              mg/dL  Blood                                    NEGATIVE          NEG  Protein                                  NEGATIVE          NEG              mg/dL  Urobilinogen                             0.2               0.0-1.0          mg/dL  Nitrite                                  NEGATIVE          NEG  Leukocytes  SMALL      a      NEG   Squamous Epithelial / LPF                FEW        a      RARE  Casts / HPF                              SEE NOTE.  a      NEG    HYALINE CASTS    GRANULAR CAST    WBC CAST  WBC / HPF                                 3-6               <3               WBC/hpf  Bacteria / HPF                           MANY       a      RARE  Urine-Other                              SEE NOTE.    MUCOUS PRESENT   Imaging results:  CXR: Findings: Portable semi upright AP view 1810 hours. Stable cardiomegaly and mediastinal contours.  Lower lung volumes.  No pneumothorax, pulmonary edema or pleural effusion.  Increased retrocardiac opacity obscuring the left hemidiaphragm.  No air bronchograms.  IMPRESSION: Low lung volumes with probable retrocardiac atelectasis.   Assessment & Plan by Problem:  () Sepsis: pt meets sepsis criteria with temp on admission of 11F and WBC of 30.1 with presumed source of C. Dif colitis +/- UTI.  She was also slightly hypotensive on admission and is responding well to IVFs. - Admit to SDU - stool for C. Diff PCR - BCx obtained prior to abx and pending - PO vanc for empiric tx of c. Diff - Cipro for empiric tx of UTI pending UCx sensitivities - Continue saccharomyces probiotic supplement for c. Diff - Monitor closely  ()Diarrhea/abdominal pain: most likely C. Diff colitis based on pts recent diagnosis, profuse diarrhea, abdominal pain and distention, and WBC of 30.1 with left shift.  Other possibilities include: SBO, mesenteric ischemia (less likely given normal lactic acid and exam findings), bowel performation (also unlikely as pt without peritoneal signs or hx of PUD), infectious diarrhea (very unlikely as recent stool cx negative, pt without travel hx).  () Hypotension: pt with SBPs in the 90s on admission, related to problem #1 and #2.  This responded well to minimal fluids given in the ER. - Will give NS bolus and increase IVFs - Continue to monitor closely  ()Hyponatremia: this does not appear to be a chronic problem for Ms. Deroos and most likely the result of GI loss from her profuse diarrhea.   - Will hydrate slowly  - BMET at midnight to correction is  progressing as expected and not too rapidly - BMET in am - Check urine osm, na, cr  ()AMS: likely the result of problems #1, 2, 3, and 4.   - Will continue to monitor   () ARF: likely pre-renal from diminished renal perfusion 2/2 #  1. Renal U/S performed on 12/06/10 that revealed no hydronephrosis or other abnormality   - Will hydrate with NS - Check urine na, cr  () Hypothyroidism: will continue synthroid.    () Anemia:  Stable.  Hgb at baseline  () Depression: will continue Celexa  () Code status: full  ()DVT ppx: pt on rivaroxaban; confirmed with pharmacy that lovenox/heparin/scds not needed.

## 2011-01-21 ENCOUNTER — Inpatient Hospital Stay (HOSPITAL_COMMUNITY): Payer: Medicare Other

## 2011-01-21 ENCOUNTER — Encounter (HOSPITAL_COMMUNITY): Payer: Self-pay | Admitting: Radiology

## 2011-01-21 DIAGNOSIS — A419 Sepsis, unspecified organism: Secondary | ICD-10-CM

## 2011-01-21 DIAGNOSIS — N179 Acute kidney failure, unspecified: Secondary | ICD-10-CM

## 2011-01-21 DIAGNOSIS — R5383 Other fatigue: Secondary | ICD-10-CM

## 2011-01-21 DIAGNOSIS — R5381 Other malaise: Secondary | ICD-10-CM

## 2011-01-21 LAB — BASIC METABOLIC PANEL
CO2: 20 mEq/L (ref 19–32)
Calcium: 7.8 mg/dL — ABNORMAL LOW (ref 8.4–10.5)
Chloride: 94 mEq/L — ABNORMAL LOW (ref 96–112)
Chloride: 98 mEq/L (ref 96–112)
GFR calc Af Amer: 35 mL/min — ABNORMAL LOW (ref >60)
GFR calc Af Amer: 39 mL/min — ABNORMAL LOW (ref >60)
GFR calc non Af Amer: 29 mL/min — ABNORMAL LOW (ref >60)
Potassium: 4.2 mEq/L (ref 3.5–5.1)
Potassium: 4.2 mEq/L (ref 3.5–5.1)
Sodium: 123 mEq/L — ABNORMAL LOW (ref 135–145)
Sodium: 127 mEq/L — ABNORMAL LOW (ref 135–145)

## 2011-01-21 LAB — URINALYSIS, ROUTINE W REFLEX MICROSCOPIC
Nitrite: NEGATIVE
Specific Gravity, Urine: 1.01 (ref 1.005–1.030)
Urobilinogen, UA: 0.2 mg/dL (ref 0.0–1.0)

## 2011-01-21 LAB — CBC
HCT: 31.6 % — ABNORMAL LOW (ref 36.0–46.0)
Hemoglobin: 11 g/dL — ABNORMAL LOW (ref 12.0–15.0)
MCHC: 34.8 g/dL (ref 30.0–36.0)
RBC: 3.84 MIL/uL — ABNORMAL LOW (ref 3.87–5.11)
WBC: 21.3 10*3/uL — ABNORMAL HIGH (ref 4.0–10.5)

## 2011-01-21 LAB — URINE MICROSCOPIC-ADD ON

## 2011-01-21 LAB — HEPATIC FUNCTION PANEL
Bilirubin, Direct: <0.1 mg/dL (ref 0.0–0.3)
Total Protein: 4 g/dL — ABNORMAL LOW (ref 6.0–8.3)

## 2011-01-21 LAB — OSMOLALITY, URINE: Osmolality, Ur: 328 mOsm/kg — ABNORMAL LOW (ref 390–1090)

## 2011-01-21 LAB — MRSA PCR SCREENING: MRSA by PCR: NEGATIVE

## 2011-01-21 LAB — PROCALCITONIN: Procalcitonin: 1.17 ng/mL

## 2011-01-22 LAB — BASIC METABOLIC PANEL
Calcium: 7.6 mg/dL — ABNORMAL LOW (ref 8.4–10.5)
Creatinine, Ser: 1.18 mg/dL (ref 0.4–1.2)
GFR calc Af Amer: 53 mL/min — ABNORMAL LOW (ref >60)
GFR calc non Af Amer: 44 mL/min — ABNORMAL LOW (ref >60)
Sodium: 124 mEq/L — ABNORMAL LOW (ref 135–145)

## 2011-01-22 LAB — DIFFERENTIAL
Basophils Absolute: 0 10*3/uL (ref 0.0–0.1)
Eosinophils Absolute: 0.2 10*3/uL (ref 0.0–0.7)
Lymphs Abs: 0.8 10*3/uL (ref 0.7–4.0)
Monocytes Absolute: 1.4 10*3/uL — ABNORMAL HIGH (ref 0.1–1.0)
Neutro Abs: 16.9 10*3/uL — ABNORMAL HIGH (ref 1.7–7.7)

## 2011-01-22 LAB — URINE CULTURE
Colony Count: >=100000
Colony Count: NO GROWTH
Culture  Setup Time: 201204060520
Culture: NO GROWTH

## 2011-01-22 LAB — CBC
MCH: 28.3 pg (ref 26.0–34.0)
MCHC: 34 g/dL (ref 30.0–36.0)
RDW: 15.4 % (ref 11.5–15.5)

## 2011-01-23 LAB — BASIC METABOLIC PANEL
BUN: 23 mg/dL (ref 6–23)
Calcium: 7.7 mg/dL — ABNORMAL LOW (ref 8.4–10.5)
Chloride: 106 mEq/L (ref 96–112)
Creatinine, Ser: 0.95 mg/dL (ref 0.4–1.2)
GFR calc Af Amer: >60 mL/min (ref >60)
GFR calc non Af Amer: 56 mL/min — ABNORMAL LOW (ref >60)

## 2011-01-23 LAB — CBC
MCH: 28.6 pg (ref 26.0–34.0)
Platelets: 381 10*3/uL (ref 150–400)
RBC: 3.91 MIL/uL (ref 3.87–5.11)
RDW: 15.7 % — ABNORMAL HIGH (ref 11.5–15.5)

## 2011-01-24 LAB — BASIC METABOLIC PANEL
Calcium: 7.7 mg/dL — ABNORMAL LOW (ref 8.4–10.5)
GFR calc Af Amer: >60 mL/min (ref >60)
GFR calc non Af Amer: >60 mL/min (ref >60)
Potassium: 4.4 mEq/L (ref 3.5–5.1)
Sodium: 131 mEq/L — ABNORMAL LOW (ref 135–145)

## 2011-01-24 LAB — CBC
MCV: 85.7 fL (ref 78.0–100.0)
Platelets: 461 10*3/uL — ABNORMAL HIGH (ref 150–400)
RBC: 4.46 MIL/uL (ref 3.87–5.11)
RDW: 16.1 % — ABNORMAL HIGH (ref 11.5–15.5)
WBC: 12.1 10*3/uL — ABNORMAL HIGH (ref 4.0–10.5)

## 2011-01-24 LAB — MAGNESIUM: Magnesium: 2 mg/dL (ref 1.5–2.5)

## 2011-01-25 ENCOUNTER — Inpatient Hospital Stay (HOSPITAL_COMMUNITY): Payer: Medicare Other

## 2011-01-25 LAB — COMPREHENSIVE METABOLIC PANEL
ALT: 15 U/L (ref 0–35)
AST: 20 U/L (ref 0–37)
Albumin: 1.8 g/dL — ABNORMAL LOW (ref 3.5–5.2)
CO2: 21 mEq/L (ref 19–32)
Chloride: 109 mEq/L (ref 96–112)
Creatinine, Ser: 0.98 mg/dL (ref 0.4–1.2)
GFR calc Af Amer: >60 mL/min (ref >60)
GFR calc non Af Amer: 54 mL/min — ABNORMAL LOW (ref >60)
Sodium: 134 mEq/L — ABNORMAL LOW (ref 135–145)
Total Bilirubin: 0.2 mg/dL — ABNORMAL LOW (ref 0.3–1.2)

## 2011-01-25 LAB — CBC
Hemoglobin: 12 g/dL (ref 12.0–15.0)
MCH: 28 pg (ref 26.0–34.0)
Platelets: 458 10*3/uL — ABNORMAL HIGH (ref 150–400)
RBC: 4.28 MIL/uL (ref 3.87–5.11)
WBC: 12.7 10*3/uL — ABNORMAL HIGH (ref 4.0–10.5)

## 2011-01-25 LAB — BRAIN NATRIURETIC PEPTIDE: Pro B Natriuretic peptide (BNP): 285 pg/mL — ABNORMAL HIGH (ref 0.0–100.0)

## 2011-01-26 DIAGNOSIS — R5383 Other fatigue: Secondary | ICD-10-CM

## 2011-01-26 DIAGNOSIS — R5381 Other malaise: Secondary | ICD-10-CM

## 2011-01-26 DIAGNOSIS — N179 Acute kidney failure, unspecified: Secondary | ICD-10-CM

## 2011-01-26 DIAGNOSIS — A419 Sepsis, unspecified organism: Secondary | ICD-10-CM

## 2011-01-26 LAB — CULTURE, BLOOD (ROUTINE X 2)

## 2011-01-26 LAB — CBC
HCT: 33.3 % — ABNORMAL LOW (ref 36.0–46.0)
Hemoglobin: 10.9 g/dL — ABNORMAL LOW (ref 12.0–15.0)
MCH: 28.2 pg (ref 26.0–34.0)
MCHC: 32.7 g/dL (ref 30.0–36.0)
RDW: 16.6 % — ABNORMAL HIGH (ref 11.5–15.5)

## 2011-01-26 LAB — BASIC METABOLIC PANEL
CO2: 23 mEq/L (ref 19–32)
Calcium: 8.1 mg/dL — ABNORMAL LOW (ref 8.4–10.5)
Chloride: 111 mEq/L (ref 96–112)
Creatinine, Ser: 0.96 mg/dL (ref 0.4–1.2)
Glucose, Bld: 95 mg/dL (ref 70–99)
Sodium: 138 mEq/L (ref 135–145)

## 2011-01-27 LAB — CULTURE, BLOOD (ROUTINE X 2): Culture  Setup Time: 201204060214

## 2011-01-27 LAB — BASIC METABOLIC PANEL
BUN: 13 mg/dL (ref 6–23)
CO2: 21 mEq/L (ref 19–32)
Calcium: 8.2 mg/dL — ABNORMAL LOW (ref 8.4–10.5)
Creatinine, Ser: 0.88 mg/dL (ref 0.4–1.2)
GFR calc Af Amer: >60 mL/min (ref >60)

## 2011-01-27 LAB — CBC
Hemoglobin: 11.9 g/dL — ABNORMAL LOW (ref 12.0–15.0)
MCH: 27.9 pg (ref 26.0–34.0)
MCHC: 32.4 g/dL (ref 30.0–36.0)
MCV: 86.2 fL (ref 78.0–100.0)

## 2011-01-28 LAB — BASIC METABOLIC PANEL
BUN: 11 mg/dL (ref 6–23)
Calcium: 8 mg/dL — ABNORMAL LOW (ref 8.4–10.5)
Creatinine, Ser: 0.87 mg/dL (ref 0.4–1.2)
GFR calc non Af Amer: >60 mL/min (ref >60)
Glucose, Bld: 91 mg/dL (ref 70–99)
Potassium: 4.4 mEq/L (ref 3.5–5.1)

## 2011-01-28 LAB — CBC
HCT: 33.6 % — ABNORMAL LOW (ref 36.0–46.0)
MCH: 28.2 pg (ref 26.0–34.0)
MCHC: 32.7 g/dL (ref 30.0–36.0)
MCV: 86.2 fL (ref 78.0–100.0)
Platelets: 402 10*3/uL — ABNORMAL HIGH (ref 150–400)
RDW: 16.7 % — ABNORMAL HIGH (ref 11.5–15.5)
WBC: 7.8 10*3/uL (ref 4.0–10.5)

## 2011-01-29 LAB — CBC
MCHC: 32.5 g/dL (ref 30.0–36.0)
Platelets: 380 10*3/uL (ref 150–400)
RDW: 16.8 % — ABNORMAL HIGH (ref 11.5–15.5)
WBC: 7.8 10*3/uL (ref 4.0–10.5)

## 2011-01-29 LAB — BASIC METABOLIC PANEL
Calcium: 8.1 mg/dL — ABNORMAL LOW (ref 8.4–10.5)
GFR calc non Af Amer: >60 mL/min (ref >60)
Glucose, Bld: 113 mg/dL — ABNORMAL HIGH (ref 70–99)
Potassium: 4.9 mEq/L (ref 3.5–5.1)
Sodium: 132 mEq/L — ABNORMAL LOW (ref 135–145)

## 2011-01-30 LAB — CBC
HCT: 32.5 % — ABNORMAL LOW (ref 36.0–46.0)
Hemoglobin: 10.7 g/dL — ABNORMAL LOW (ref 12.0–15.0)
MCHC: 32.9 g/dL (ref 30.0–36.0)
RDW: 16.8 % — ABNORMAL HIGH (ref 11.5–15.5)
WBC: 5.6 10*3/uL (ref 4.0–10.5)

## 2011-01-31 ENCOUNTER — Inpatient Hospital Stay (HOSPITAL_COMMUNITY): Payer: Medicare Other

## 2011-01-31 DIAGNOSIS — R5383 Other fatigue: Secondary | ICD-10-CM

## 2011-01-31 DIAGNOSIS — R5381 Other malaise: Secondary | ICD-10-CM

## 2011-01-31 DIAGNOSIS — N179 Acute kidney failure, unspecified: Secondary | ICD-10-CM

## 2011-01-31 DIAGNOSIS — A419 Sepsis, unspecified organism: Secondary | ICD-10-CM

## 2011-01-31 LAB — CBC
HCT: 33.6 % — ABNORMAL LOW (ref 36.0–46.0)
Hemoglobin: 10.2 g/dL — ABNORMAL LOW (ref 12.0–15.0)
Hemoglobin: 11.3 g/dL — ABNORMAL LOW (ref 12.0–15.0)
MCH: 28.3 pg (ref 26.0–34.0)
MCHC: 34.3 g/dL (ref 30.0–36.0)
MCV: 86.4 fL (ref 78.0–100.0)
MCV: 89.1 fL (ref 78.0–100.0)
Platelets: 431 10*3/uL — ABNORMAL HIGH (ref 150–400)
RBC: 3.6 MIL/uL — ABNORMAL LOW (ref 3.87–5.11)
RBC: 3.56 MIL/uL — ABNORMAL LOW (ref 3.87–5.11)
RDW: 13.6 % (ref 11.5–15.5)
WBC: 4.8 10*3/uL (ref 4.0–10.5)
WBC: 6.8 10*3/uL (ref 4.0–10.5)

## 2011-01-31 LAB — DIFFERENTIAL
Basophils Absolute: 0 10*3/uL (ref 0.0–0.1)
Basophils Relative: 1 % (ref 0–1)
Lymphocytes Relative: 18 % (ref 12–46)
Neutro Abs: 4.9 10*3/uL (ref 1.7–7.7)
Neutrophils Relative %: 71 % (ref 43–77)

## 2011-01-31 LAB — PROTIME-INR
INR: 2.5 — ABNORMAL HIGH (ref 0.00–1.49)
Prothrombin Time: 28.5 seconds — ABNORMAL HIGH (ref 11.6–15.2)

## 2011-01-31 LAB — TSH: TSH: 3.699 u[IU]/mL (ref 0.350–4.500)

## 2011-01-31 LAB — URINALYSIS, ROUTINE W REFLEX MICROSCOPIC
Bilirubin Urine: NEGATIVE
Ketones, ur: NEGATIVE mg/dL
Nitrite: POSITIVE — AB
Protein, ur: NEGATIVE mg/dL
Urobilinogen, UA: 0.2 mg/dL (ref 0.0–1.0)
pH: 7.5 (ref 5.0–8.0)

## 2011-01-31 LAB — COMPREHENSIVE METABOLIC PANEL
AST: 27 U/L (ref 0–37)
Alkaline Phosphatase: 52 U/L (ref 39–117)
BUN: 16 mg/dL (ref 6–23)
BUN: 13 mg/dL (ref 6–23)
CO2: 27 mEq/L (ref 19–32)
Calcium: 7.9 mg/dL — ABNORMAL LOW (ref 8.4–10.5)
Chloride: 100 mEq/L (ref 96–112)
Creatinine, Ser: 0.8 mg/dL (ref 0.4–1.2)
Creatinine, Ser: 0.71 mg/dL (ref 0.4–1.2)
GFR calc Af Amer: >60 mL/min (ref >60)
GFR calc non Af Amer: >60 mL/min (ref >60)
GFR calc non Af Amer: >60 mL/min (ref >60)
Glucose, Bld: 91 mg/dL (ref 70–99)
Potassium: 3.4 mEq/L — ABNORMAL LOW (ref 3.5–5.1)
Total Bilirubin: 0.4 mg/dL (ref 0.3–1.2)
Total Bilirubin: 0.2 mg/dL — ABNORMAL LOW (ref 0.3–1.2)

## 2011-01-31 LAB — URINE CULTURE: Colony Count: >=100000

## 2011-01-31 LAB — URINE MICROSCOPIC-ADD ON

## 2011-01-31 LAB — BASIC METABOLIC PANEL
CO2: 28 mEq/L (ref 19–32)
Calcium: 9.1 mg/dL (ref 8.4–10.5)
Creatinine, Ser: 0.78 mg/dL (ref 0.4–1.2)
GFR calc Af Amer: >60 mL/min (ref >60)
GFR calc non Af Amer: >60 mL/min (ref >60)
Glucose, Bld: 82 mg/dL (ref 70–99)

## 2011-01-31 LAB — LIPASE, BLOOD: Lipase: 44 U/L (ref 11–59)

## 2011-01-31 LAB — LACTIC ACID, PLASMA: Lactic Acid, Venous: 0.5 mmol/L (ref 0.5–2.2)

## 2011-01-31 LAB — MAGNESIUM: Magnesium: 1.9 mg/dL (ref 1.5–2.5)

## 2011-01-31 MED ORDER — IOHEXOL 300 MG/ML  SOLN
100.0000 mL | Freq: Once | INTRAMUSCULAR | Status: AC | PRN
  Administered 2011-01-31: 100 mL via INTRAVENOUS

## 2011-02-01 ENCOUNTER — Ambulatory Visit: Payer: Medicare Other | Admitting: Internal Medicine

## 2011-02-01 LAB — CBC
Hemoglobin: 9.5 g/dL — ABNORMAL LOW (ref 12.0–15.0)
MCH: 28.4 pg (ref 26.0–34.0)
Platelets: 418 10*3/uL — ABNORMAL HIGH (ref 150–400)
RBC: 3.34 MIL/uL — ABNORMAL LOW (ref 3.87–5.11)
WBC: 5.3 10*3/uL (ref 4.0–10.5)

## 2011-02-01 LAB — BASIC METABOLIC PANEL
CO2: 31 mEq/L (ref 19–32)
Chloride: 104 mEq/L (ref 96–112)
GFR calc Af Amer: >60 mL/min (ref >60)
Potassium: 4 mEq/L (ref 3.5–5.1)
Sodium: 138 mEq/L (ref 135–145)

## 2011-02-02 LAB — CBC
HCT: 30.8 % — ABNORMAL LOW (ref 36.0–46.0)
Hemoglobin: 10 g/dL — ABNORMAL LOW (ref 12.0–15.0)
MCH: 28.1 pg (ref 26.0–34.0)
MCV: 86.5 fL (ref 78.0–100.0)
Platelets: 420 10*3/uL — ABNORMAL HIGH (ref 150–400)
RBC: 3.56 MIL/uL — ABNORMAL LOW (ref 3.87–5.11)
WBC: 5 10*3/uL (ref 4.0–10.5)

## 2011-02-02 LAB — BASIC METABOLIC PANEL
BUN: 9 mg/dL (ref 6–23)
CO2: 34 mEq/L — ABNORMAL HIGH (ref 19–32)
Chloride: 100 mEq/L (ref 96–112)
Creatinine, Ser: 0.78 mg/dL (ref 0.4–1.2)
Potassium: 3.2 mEq/L — ABNORMAL LOW (ref 3.5–5.1)

## 2011-02-03 DIAGNOSIS — E46 Unspecified protein-calorie malnutrition: Secondary | ICD-10-CM

## 2011-02-03 DIAGNOSIS — R627 Adult failure to thrive: Secondary | ICD-10-CM

## 2011-02-03 DIAGNOSIS — A0472 Enterocolitis due to Clostridium difficile, not specified as recurrent: Secondary | ICD-10-CM

## 2011-02-03 DIAGNOSIS — G9340 Encephalopathy, unspecified: Secondary | ICD-10-CM

## 2011-02-03 DIAGNOSIS — R188 Other ascites: Secondary | ICD-10-CM

## 2011-02-03 DIAGNOSIS — R195 Other fecal abnormalities: Secondary | ICD-10-CM

## 2011-02-03 LAB — CBC
HCT: 28.8 % — ABNORMAL LOW (ref 36.0–46.0)
MCH: 28.5 pg (ref 26.0–34.0)
MCV: 87.3 fL (ref 78.0–100.0)
Platelets: 390 10*3/uL (ref 150–400)
RBC: 3.3 MIL/uL — ABNORMAL LOW (ref 3.87–5.11)
RDW: 17.1 % — ABNORMAL HIGH (ref 11.5–15.5)
WBC: 5.9 10*3/uL (ref 4.0–10.5)

## 2011-02-03 LAB — BASIC METABOLIC PANEL
BUN: 8 mg/dL (ref 6–23)
Chloride: 102 mEq/L (ref 96–112)
Creatinine, Ser: 0.73 mg/dL (ref 0.4–1.2)
Glucose, Bld: 122 mg/dL — ABNORMAL HIGH (ref 70–99)
Potassium: 4.9 mEq/L (ref 3.5–5.1)

## 2011-02-03 NOTE — Discharge Summary (Unsigned)
Follow up:  Please check a CBC and Cmet at her follow up appointment for monitoring her Hemoglobin, creatinine, and albumin.  Should also continue our goals of care conversation in this debilitated woman.  Please see dictated summary for hospitalization course and medications.

## 2011-02-04 DIAGNOSIS — R5383 Other fatigue: Secondary | ICD-10-CM

## 2011-02-04 DIAGNOSIS — N179 Acute kidney failure, unspecified: Secondary | ICD-10-CM

## 2011-02-04 DIAGNOSIS — A419 Sepsis, unspecified organism: Secondary | ICD-10-CM

## 2011-02-04 DIAGNOSIS — R5381 Other malaise: Secondary | ICD-10-CM

## 2011-02-05 ENCOUNTER — Emergency Department (HOSPITAL_COMMUNITY)
Admission: EM | Admit: 2011-02-05 | Discharge: 2011-02-05 | Disposition: A | Discharge status: Home / Self Care | Payer: Medicare Other | Attending: Emergency Medicine | Admitting: Emergency Medicine

## 2011-02-05 ENCOUNTER — Emergency Department (HOSPITAL_COMMUNITY): Payer: Medicare Other

## 2011-02-05 DIAGNOSIS — R142 Eructation: Secondary | ICD-10-CM | POA: Insufficient documentation

## 2011-02-05 DIAGNOSIS — Z79899 Other long term (current) drug therapy: Secondary | ICD-10-CM | POA: Insufficient documentation

## 2011-02-05 DIAGNOSIS — R0602 Shortness of breath: Secondary | ICD-10-CM | POA: Insufficient documentation

## 2011-02-05 DIAGNOSIS — R0609 Other forms of dyspnea: Secondary | ICD-10-CM | POA: Insufficient documentation

## 2011-02-05 DIAGNOSIS — F329 Major depressive disorder, single episode, unspecified: Secondary | ICD-10-CM | POA: Insufficient documentation

## 2011-02-05 DIAGNOSIS — R141 Gas pain: Secondary | ICD-10-CM | POA: Insufficient documentation

## 2011-02-05 DIAGNOSIS — E039 Hypothyroidism, unspecified: Secondary | ICD-10-CM | POA: Insufficient documentation

## 2011-02-05 DIAGNOSIS — R0989 Other specified symptoms and signs involving the circulatory and respiratory systems: Secondary | ICD-10-CM | POA: Insufficient documentation

## 2011-02-05 DIAGNOSIS — R5381 Other malaise: Secondary | ICD-10-CM | POA: Insufficient documentation

## 2011-02-05 DIAGNOSIS — R002 Palpitations: Secondary | ICD-10-CM | POA: Insufficient documentation

## 2011-02-05 DIAGNOSIS — I4891 Unspecified atrial fibrillation: Secondary | ICD-10-CM | POA: Insufficient documentation

## 2011-02-05 DIAGNOSIS — F3289 Other specified depressive episodes: Secondary | ICD-10-CM | POA: Insufficient documentation

## 2011-02-05 DIAGNOSIS — R42 Dizziness and giddiness: Secondary | ICD-10-CM | POA: Insufficient documentation

## 2011-02-05 DIAGNOSIS — A0472 Enterocolitis due to Clostridium difficile, not specified as recurrent: Secondary | ICD-10-CM | POA: Insufficient documentation

## 2011-02-05 LAB — URINALYSIS, ROUTINE W REFLEX MICROSCOPIC
Bilirubin Urine: NEGATIVE
Glucose, UA: NEGATIVE mg/dL
Hgb urine dipstick: NEGATIVE
Ketones, ur: NEGATIVE mg/dL
pH: 6.5 (ref 5.0–8.0)

## 2011-02-05 LAB — DIFFERENTIAL
Basophils Relative: 1 % (ref 0–1)
Eosinophils Absolute: 0.1 10*3/uL (ref 0.0–0.7)
Eosinophils Relative: 1 % (ref 0–5)
Lymphs Abs: 0.5 10*3/uL — ABNORMAL LOW (ref 0.7–4.0)
Monocytes Relative: 5 % (ref 3–12)
Neutrophils Relative %: 88 % — ABNORMAL HIGH (ref 43–77)

## 2011-02-05 LAB — URINE MICROSCOPIC-ADD ON

## 2011-02-05 LAB — CBC
MCH: 28.3 pg (ref 26.0–34.0)
MCHC: 32.3 g/dL (ref 30.0–36.0)
MCV: 87.5 fL (ref 78.0–100.0)
Platelets: 347 10*3/uL (ref 150–400)
RDW: 17.2 % — ABNORMAL HIGH (ref 11.5–15.5)

## 2011-02-05 LAB — BASIC METABOLIC PANEL
BUN: 10 mg/dL (ref 6–23)
CO2: 33 mEq/L — ABNORMAL HIGH (ref 19–32)
Chloride: 99 mEq/L (ref 96–112)
Creatinine, Ser: 0.73 mg/dL (ref 0.4–1.2)
Glucose, Bld: 121 mg/dL — ABNORMAL HIGH (ref 70–99)
Potassium: 4.2 mEq/L (ref 3.5–5.1)

## 2011-02-05 LAB — POCT CARDIAC MARKERS

## 2011-02-06 ENCOUNTER — Inpatient Hospital Stay (HOSPITAL_COMMUNITY)
Admission: EM | Admit: 2011-02-06 | Discharge: 2011-02-11 | DRG: 640 | Disposition: A | Discharge status: Skilled Nursing Facility | Payer: Medicare Other | Attending: Infectious Diseases | Admitting: Infectious Diseases

## 2011-02-06 ENCOUNTER — Encounter: Payer: Self-pay | Admitting: Ophthalmology

## 2011-02-06 ENCOUNTER — Inpatient Hospital Stay (HOSPITAL_COMMUNITY): Payer: Medicare Other

## 2011-02-06 DIAGNOSIS — M1990 Unspecified osteoarthritis, unspecified site: Secondary | ICD-10-CM | POA: Diagnosis present

## 2011-02-06 DIAGNOSIS — A0472 Enterocolitis due to Clostridium difficile, not specified as recurrent: Secondary | ICD-10-CM | POA: Diagnosis present

## 2011-02-06 DIAGNOSIS — F341 Dysthymic disorder: Secondary | ICD-10-CM | POA: Diagnosis present

## 2011-02-06 DIAGNOSIS — R5381 Other malaise: Secondary | ICD-10-CM | POA: Diagnosis present

## 2011-02-06 DIAGNOSIS — R627 Adult failure to thrive: Secondary | ICD-10-CM | POA: Diagnosis present

## 2011-02-06 DIAGNOSIS — D638 Anemia in other chronic diseases classified elsewhere: Secondary | ICD-10-CM | POA: Diagnosis present

## 2011-02-06 DIAGNOSIS — I10 Essential (primary) hypertension: Secondary | ICD-10-CM | POA: Diagnosis present

## 2011-02-06 DIAGNOSIS — I4891 Unspecified atrial fibrillation: Secondary | ICD-10-CM | POA: Diagnosis present

## 2011-02-06 DIAGNOSIS — J90 Pleural effusion, not elsewhere classified: Secondary | ICD-10-CM | POA: Diagnosis present

## 2011-02-06 DIAGNOSIS — E8779 Other fluid overload: Principal | ICD-10-CM | POA: Diagnosis present

## 2011-02-06 DIAGNOSIS — Y844 Aspiration of fluid as the cause of abnormal reaction of the patient, or of later complication, without mention of misadventure at the time of the procedure: Secondary | ICD-10-CM | POA: Diagnosis not present

## 2011-02-06 DIAGNOSIS — J95811 Postprocedural pneumothorax: Secondary | ICD-10-CM | POA: Diagnosis not present

## 2011-02-06 DIAGNOSIS — E039 Hypothyroidism, unspecified: Secondary | ICD-10-CM | POA: Diagnosis present

## 2011-02-06 DIAGNOSIS — Z8673 Personal history of transient ischemic attack (TIA), and cerebral infarction without residual deficits: Secondary | ICD-10-CM

## 2011-02-06 DIAGNOSIS — E43 Unspecified severe protein-calorie malnutrition: Secondary | ICD-10-CM | POA: Diagnosis present

## 2011-02-06 DIAGNOSIS — Y9210 Unspecified residential institution as the place of occurrence of the external cause: Secondary | ICD-10-CM | POA: Diagnosis not present

## 2011-02-06 DIAGNOSIS — R188 Other ascites: Secondary | ICD-10-CM | POA: Diagnosis present

## 2011-02-06 LAB — BRAIN NATRIURETIC PEPTIDE: Pro B Natriuretic peptide (BNP): 702 pg/mL — ABNORMAL HIGH (ref 0.0–100.0)

## 2011-02-06 NOTE — H&P (Signed)
Hospital Admission Note Date: 02/06/2011  Patient name: [name] [name] Medical record number: [id number] Date of birth: 12-27-1923 Age: 77 y.o. Gender: female PCP: Anderson Malta, DO  Medical Service: Internal Medicine Teaching Service  Attending physician:  Dr. Maurice March     Resident [phone]): Dr. Scot Dock    Pager: [phone] Resident (R1): Dr. Tonny Branch     Pager: [phone]  Chief Complaint: SOB  History of Present Illness:  This is a 77 year old female with a history of atrial fibrillation, CVA and recent hospitalization (discharged 4/19) for CDiff colitis, currently being treated with vancomycin and flagyl, who presents with a 1 day hx of acutely worsening SOB. Most of the history was provided by the patients daughter, though the patients mental status is reportedly "better than ever".  Apparently, since her discharge, the patient has had mild SOB which was felt to be secondary to a plural effusion noted during the last hospitalization and felt to be secondary to decreased albumin. Unfortunately, today, the patient developed worsening cough productive of yellow sputum as well as worsening SOB.  The patient was brought to the ED where she was reportedly in AFIB with RVR, however, this has resolved by the time the patient was evaluated by the internal medicine team.  The patient was then discharged only to return to her SNF and redevelop symptoms.  EMS was contacted and the patient was returned to the ED. BP on admission was 210/83, and initial EKG showed rate of 98 and AFib.  The patients BP had returned to baseline without intervention at the time of evaluation.  Currently, the patient denies any fevers, chills, dysuria, or chest pain.     Current Outpatient Prescriptions  Medication Sig Dispense Refill  . carbamide peroxide (DEBROX) 6.5 % otic solution Place 5 drops into the right ear 2 (two) times daily.  15 mL  2  . citalopram (CELEXA) 20 MG tablet Take 20 mg by mouth daily.        Marland Kitchen diltiazem  (DILACOR XR) 120 MG 24 hr capsule Take 120 mg by mouth daily.        Marland Kitchen docusate sodium (COLACE) 100 MG capsule Take 100 mg by mouth 2 (two) times daily.        Marland Kitchen ENSURE (ENSURE) Take 237 mLs by mouth 3 (three) times daily between meals.        . ferrous sulfate 325 (65 FE) MG tablet Take 325 mg by mouth daily with breakfast.        . Incontinence Supplies MISC Use as directed.  Please provide 100 Briefs and 100 chux pads. Dx: Urinary Incontinence, Mixed; Bladder Spam 788.30   100 each  prn  . levothyroxine (SYNTHROID, LEVOTHROID) 100 MCG tablet Take 100 mcg by mouth daily.        . Lidocaine-Hydrocortisone Ace 3-0.5 % CREA Apply topically. 4 times daily as needed       . metroNIDAZOLE (FLAGYL) 500 MG tablet Take 500 mg by mouth 3 (three) times daily.        . Multiple Vitamins-Minerals (MULTIVITAMIN WITH MINERALS) tablet Take 1 tablet by mouth daily.        . ondansetron (ZOFRAN) 4 MG tablet Take 4 mg by mouth every 8 (eight) hours as needed.        . saccharomyces boulardii (FLORASTOR) 250 MG capsule Take 250 mg by mouth 2 (two) times daily.        . vancomycin (VANCOCIN) 50 mg/mL oral solution Take by mouth as directed.  125 mg PO as directed         Allergies: Penicillins  Past Medical History  Diagnosis Date  . Renal insufficiency   . Hypertension   . A-fib   . Anxiety   . Depression   . Hypothyroidism   . Inguinal hernia     repaired 2011  . Osteoarthritis   . Cerebral vascular accident   . DJD (degenerative joint disease)   . Recurrent UTI     Past Surgical History  Procedure Date  . Inguinal hernia repair   . Bladder surgery     prolapsed bladder    Family History  Problem Relation Age of Onset  . Coronary artery disease    . Hypertension    . Stroke    . Colon cancer      neg. hx.    History   Social History  . Marital Status: Divorced    Spouse Name: N/A    Number of Children: N/A  . Years of Education: N/A   Occupational History  . Not on file.    Social History Main Topics  . Smoking status: Former Smoker    Types: Cigarettes    Quit date: 10/17/1960  . Smokeless tobacco: Not on file  . Alcohol Use: No  . Drug Use: No  . Sexually Active: Not on file   Other Topics Concern  . Not on file   Social History Narrative  . No narrative on file   ROS: Negative as per HPI.   VITALS: T: 97.7 P:94  BP:210/82  R:22  O2SAT:99%  ON: 4L  PHYSICAL EXAM: General:  alert, well-developed, and cooperative to examination.   Head:  normocephalic and atraumatic.   Eyes:  vision grossly intact, pupils equal, pupils round, pupils reactive to light, no injection and anicteric.   Mouth:  pharynx pink and slightly dry, no erythema, and no exudates.   Neck:  supple, full ROM, no thyromegaly, no JVD, and no carotid bruits.   Lungs:  normal respiratory effort, no accessory muscle use, diffuse coarse breath sounds and expiratory wheezes bilaterally.  Heart:  normal rate, slightly irregular rhythm, 2/6 systolic murmur, no gallop, and no rub.   Abdomen:  soft, non-tender, normal bowel sounds, no distention, no guarding, no rebound tenderness, no hepatomegaly, and no splenomegaly.   Msk:  no joint swelling, no joint warmth, and no redness over joints.   Pulses:  2+ DP/PT pulses bilaterally Extremities:  No cyanosis, clubbing, edema  Neurologic:  alert & oriented X3, cranial nerves II-XII intact, strength normal in all extremities, sensation intact to light touch.  Skin:  turgor normal and no rashes.   Psych: decreased interaction, good eye contact, not anxious appearing, and not depressed  appearing.  LABS: WBC                                      10.2              4.0-10.5         K/uL  RBC                                      3.43       l      3.87-5.11        MIL/uL  Hemoglobin (HGB)  9.7        l      12.0-15.0        g/dL  Hematocrit (HCT)                         30.0       l      36.0-46.0        %  MCV                                       87.5              78.0-100.0       fL  MCH -                                    28.3              26.0-34.0        pg  MCHC                                     32.3              30.0-36.0        g/dL  RDW                                      17.2       h      11.5-15.5        %  Platelet Count (PLT)                     347               150-400          K/uL  Neutrophils, %                           88         h      43-77            %  Lymphocytes, %                           5          l      12-46            %  Monocytes, %                             5                 3-12             %  Eosinophils, %                           1                 0-5              %  Basophils, %                             1                 0-1              %  Neutrophils, Absolute                    9.0        h      1.7-7.7          K/uL  Lymphocytes, Absolute                    0.5        l      0.7-4.0          K/uL  Monocytes, Absolute                      0.5               0.1-1.0          K/uL  Eosinophils, Absolute                    0.1               0.0-0.7          K/uL  Basophils, Absolute                      0.1               0.0-0.1          K/uL  Sodium (NA)                              136               135-145          mEq/L  Potassium (K)                            4.2               3.5-5.1          mEq/L  Chloride                                 99                96-112           mEq/L  CO2                                      33         h      19-32            mEq/L  Glucose                                  121        h      70-99  mg/dL  BUN                                      10                6-23             mg/dL  Creatinine                               0.73              0.4-1.2          mg/dL  GFR, Est Non African American            >60               >60              mL/min  GFR, Est African American                >60               >60               mL/min    Oversized comment, see footnote  1  Calcium                                  8.6               8.4-10.5         mg/dL   CKMB, POC                                2.0               1.0-8.0          ng/mL  Troponin I, POC                          <0.05             0.00-0.09        ng/mL  Myoglobin, POC                           127               12-200           Ng/mL   Color, Urine                             YELLOW            YELLOW  Appearance                               CLOUDY     a      CLEAR  Specific Gravity                         1.017             1.005-1.030  pH  6.5               5.0-8.0  Urine Glucose                            NEGATIVE          NEG              mg/dL  Bilirubin                                NEGATIVE          NEG  Ketones                                  NEGATIVE          NEG              mg/dL  Blood                                    NEGATIVE          NEG  Protein                                  30         a      NEG              mg/dL  Urobilinogen                             0.2               0.0-1.0          mg/dL  Nitrite                                  NEGATIVE          NEG  Leukocytes                               SMALL      a      NEG   Squamous Epithelial / LPF                RARE              RARE  Casts / HPF                              SEE NOTE.  a      NEG    HYALINE CASTS  WBC / HPF                                7-10              <3               WBC/hpf  RBC / HPF  0-2               <3               RBC/hpf  Bacteria / HPF                           RARE              RARE  IMAGING:  IMPRESSION: 5pm   Increasing moderate to large left pleural effusion and moderate   right pleural effusion with bibasilar atelectasis.    Cardiomegaly and pulmonary vascular congestion.   EKG: Atrial Fibrillation with HR 98.   ASSESSMENT AND PLAN:  This is an 77 year old  with a hx of recent hospitalization for CDiff colitis, AFIB and CVA who presents with abrupt onset of worsening SOB.   SOB: The etiology is likely multifactorial and potentially related to flash pulmonary edema due to uncontrolled hypertension, as well as the patients bilateral plural effusion felt to be secondary to her albumen (1.8 4/16).  Pulmonary embolism was also on the DDX, however this was felt to be unlikely at the pt denies CP, was not hypoxic, and was hypertensive as opposed to hypotensive. The patients increased sputum production and the rapid onset are most consistent with pulmonary edema though chest X-ray was not repeated at the second admission to the ER. Theraputic pleurocentesis could be considered for symptomatic relief, if diuresis does not help.   At this time, will start lasix 40mg  BID, as well as check a BNP to rule out heart failure.   Uncontrolled hypertension: At the time of evaluation, BP had returned within normal limits without any intervention.  Patient will likely need titration of her antihypertensive meds while hospitalized.  Atrial Fibrillation with RVR: Although this was reported, EKG only demonstrated hr of 98 an hr was 94 on admission.  Pt does appear to be in atrial fibrillation.  The patient is currently not on any anticoagulation for her AFIB, however upon discussion with the patients, family, they are considering restarting the patient on either pradaxa or coumadin.  Further discussion of the risks and benefits of these medications will be deferred to the primary team.   Anemia: HGB was WNL when the patient was last admitted, but had decreased to 9.5 on discharge, this was felt to be secondary to volume administration and hgb has increased to 9.7 today.  Will continue to monitor.   C. Diff colitis: Pt reports that she is now having normal BM's without diarrhea, fevers, or chills.  Will continue Vanco and Flagyl during this hospitalization.   Goals of Care: This is  a very frail elderly patient who is currently DNR/DNI.  Further conversations should be held with regards to the patients goals of her future care and the agressiveness of that care.    Attending Physician: I have seen and examined the patient. I reviewed the resident/fellow note and agree with the findings and plan of care as documented. My additions and revisions are included.   Signature:   Date:

## 2011-02-07 ENCOUNTER — Inpatient Hospital Stay (HOSPITAL_COMMUNITY): Payer: Medicare Other

## 2011-02-07 DIAGNOSIS — I4891 Unspecified atrial fibrillation: Secondary | ICD-10-CM

## 2011-02-07 LAB — CBC
HCT: 28.4 % — ABNORMAL LOW (ref 36.0–46.0)
Hemoglobin: 9.2 g/dL — ABNORMAL LOW (ref 12.0–15.0)
MCHC: 32.4 g/dL (ref 30.0–36.0)
RBC: 3.22 MIL/uL — ABNORMAL LOW (ref 3.87–5.11)
WBC: 5.6 10*3/uL (ref 4.0–10.5)

## 2011-02-07 LAB — BASIC METABOLIC PANEL
CO2: 37 mEq/L — ABNORMAL HIGH (ref 19–32)
Calcium: 8.4 mg/dL (ref 8.4–10.5)
GFR calc Af Amer: >60 mL/min (ref >60)
GFR calc non Af Amer: >60 mL/min (ref >60)
Glucose, Bld: 101 mg/dL — ABNORMAL HIGH (ref 70–99)
Potassium: 3.3 mEq/L — ABNORMAL LOW (ref 3.5–5.1)
Sodium: 137 mEq/L (ref 135–145)

## 2011-02-07 LAB — MRSA PCR SCREENING: MRSA by PCR: NEGATIVE

## 2011-02-08 ENCOUNTER — Inpatient Hospital Stay (HOSPITAL_COMMUNITY): Payer: Medicare Other

## 2011-02-08 LAB — BODY FLUID CELL COUNT WITH DIFFERENTIAL
Neutrophil Count, Fluid: 13 % (ref 0–25)
Total Nucleated Cell Count, Fluid: 414 cu mm (ref 0–1000)

## 2011-02-08 LAB — HEPATIC FUNCTION PANEL
ALT: 20 U/L (ref 0–35)
Alkaline Phosphatase: 44 U/L (ref 39–117)
Bilirubin, Direct: <0.1 mg/dL (ref 0.0–0.3)

## 2011-02-08 LAB — URINE CULTURE: Culture  Setup Time: 201204220139

## 2011-02-08 LAB — BASIC METABOLIC PANEL
BUN: 13 mg/dL (ref 6–23)
Chloride: 94 mEq/L — ABNORMAL LOW (ref 96–112)
Creatinine, Ser: 0.69 mg/dL (ref 0.4–1.2)
Glucose, Bld: 102 mg/dL — ABNORMAL HIGH (ref 70–99)
Potassium: 3.4 mEq/L — ABNORMAL LOW (ref 3.5–5.1)

## 2011-02-08 LAB — LACTATE DEHYDROGENASE: LDH: 164 U/L (ref 94–250)

## 2011-02-08 LAB — GLUCOSE, SEROUS FLUID

## 2011-02-09 ENCOUNTER — Inpatient Hospital Stay (HOSPITAL_COMMUNITY): Payer: Medicare Other

## 2011-02-09 LAB — PH, BODY FLUID: pH, Fluid: 8

## 2011-02-10 ENCOUNTER — Inpatient Hospital Stay (HOSPITAL_COMMUNITY): Payer: Medicare Other

## 2011-02-10 DIAGNOSIS — I4891 Unspecified atrial fibrillation: Secondary | ICD-10-CM

## 2011-02-10 LAB — CBC
HCT: 32.4 % — ABNORMAL LOW (ref 36.0–46.0)
Hemoglobin: 10.3 g/dL — ABNORMAL LOW (ref 12.0–15.0)
MCH: 28.5 pg (ref 26.0–34.0)
MCHC: 31.8 g/dL (ref 30.0–36.0)
RDW: 18.2 % — ABNORMAL HIGH (ref 11.5–15.5)

## 2011-02-10 LAB — BASIC METABOLIC PANEL
BUN: 15 mg/dL (ref 6–23)
CO2: 35 mEq/L — ABNORMAL HIGH (ref 19–32)
Calcium: 9 mg/dL (ref 8.4–10.5)
Glucose, Bld: 102 mg/dL — ABNORMAL HIGH (ref 70–99)
Sodium: 141 mEq/L (ref 135–145)

## 2011-02-10 NOTE — Discharge Summary (Signed)
[name] [name], FLEAGLE NO.:  [id number]  MEDICAL RECORD NO.:  [id number]           PATIENT TYPE:  I  LOCATION:  6703                         FACILITY:  [facility]  PHYSICIAN:  Fransisco Hertz, M.D.  DATE OF BIRTH:  28-Jul-1924  DATE OF ADMISSION:  01/20/2011 DATE OF DISCHARGE:  02/03/2011                              DISCHARGE SUMMARY   DISCHARGE DIAGNOSES: 1. C diff colitis. 2. Toxic metabolic encephalopathy. 3. Atrial fibrillation. 4. Failure to thrive. 5. Shortness of breath. 6. Acute renal failure. 7. Abdominal pain and distention secondary to ascites. 8. Anemia. 9. Hypothyroidism. 10.Depression and anxiety. 11.Osteoarthritis. 12.History of transient ischemia attack.  DISCHARGE MEDICATIONS: 1. Citalopram 20 mg tablets take 1 tablet daily by mouth. 2. Diltiazem 120 mg extend release tablet take 1 tablet daily by     mouth. 3. Ensure take 1 can three times daily between meals. 4. Levothyroxine 100 mcg tablets take 1 tablet daily before breakfast. 5. Multivitamins with minerals tabs take 1 tablet daily by mouth. 6. Zofran 4 mg take q. 6 as needed. 7. Docusate 100 mg take 1 capsule twice daily as needed. 8. Iron, ferrous sulfate 325 mg take 1 tablet every morning. 9. MiraLax 17 g by mouth in 4 ounces of liquid daily as needed. 10.Tylenol 325 mg take 1-2 tablets every 6 hours as needed for pain. 11.Metronidazole 500 mg tablets, please take one dose on February 03, 2011 at 10 p.m. 12.Florastor 250 mg caplets, take two caplets by mouth twice daily. 13.Vancomycin 50 mg per mL oral solution, please take vanco 125 mg     p.o. at 6 p.m. on February 03, 2011, then vancomycin 125 mg p.o. every     12 hours for 7 days, then vancomycin 125 mg p.o. daily for 7 days,     then vancomycin 125 mg p.o. every other day for 7 days, then     vancomycin 125 mg p.o. every 3 days for 14 days, then stop.  DISPOSITION AND FOLLOWUP:  [name] was discharged from [facility] in stable and improved condition.  She will have a follow-up appointment in the outpatient clinic with her PCP, Dr. Julaine Fusi after discharge from her SNF.  She will be discharged to [facility] for rehabilitation for her general deconditioning.  She will be discharged on a single final dose of metronidazole p.o. 500 mg at 6 p.m. this evening.  She should also be discharged on an extended vancomycin taper as dictated in the discharge medications.  She should also be discharged to [facility] with palliative care following her course.  CONSULTATIONS:  Palliative Care.  PROCEDURES: 1. A chest x-ray which showed low lung volumes with probable     retrocardiac atelectasis. 2. Acute abdomen x-ray which showed airspace disease in the right lung     base suggested a pneumonia, nonobstructive bowel gas pattern     throughout.  There is a focal bowel loop shown in the decubitus     view suggest evidence of focal imprinting bowel wall edema or     inflammatory process  could also show this.  There was a CT abdomen     of the pelvis without contrast media which showed     a.     Diffuse colonic inflammatory change, which is nonspecific      especially without the presence of IV contrast material given the      diffuse nature this be seen on C diff colitis.  There is also      perirectal inflammatory change with correlation and physical exam      is recommended as underlying malignancy cannot be excluded.     b.     Volume overloaded as evidence by ascites, anasarca and      pleural effusions.     c.     Sigmoid diverticulosis with evidence of acute      diverticulitis.     d.     Hiatal hernia and evidence of reflux.     e.     Completely distended urinary bladder with a mild thick-      walled correlate with urinalysis was recommended.  A repeat      abdominal x-ray showed marked mucosal edema noted along the      transverse and descending  colon compatible with colitis seen on      prior CT.  No free intra-abdominal air seen, small bilateral      pleural effusions greater on the left than on right with a      retrocardiac consolidation raising concern of pneumonia. 3. Vascular congestion with mild cardiomegaly noted.  There was an     ultrasound of the abdomen which showed small to moderate abdominal     pelvic ascites mostly perihepatic and perisplenic.  No drainable     fluid collection in the bilateral lower quadrants, bilateral     pleural effusions and a repeat CT abdomen and pelvis with contrast     showed no free intra-abdominal air.  No specific evidence of     perforation, moderate abdominal and pelvic ascites, diffuse colonic     wall thickening appears to be improved since the previous study,     but her visualization of the colon was limited by decompression,     bilateral pleural effusions with basilar atelectasis and renal     atrophy.  ADMISSION HISTORY OF PRESENT ILLNESS:  Ms. North is an 77 year old woman with past medical history of atrial fibrillation, rate controlled, hypothyroidism, hypertension, depression, iron deficiency anemia, osteoarthritis, TIA approximately 20 years ago who was recently admitted in February for UTI and was treated with Cipro and acute renal failure who now presents with diarrhea, nausea and altered mental status.  The history was partially given by the patient and partially where her daughter after the last hospital admission, the patient went to [facility] for rehabilitation.  She started having diarrhea, approximately 5 bowel movements per day and nausea for approximately a month in duration.  The patient was diagnosed with C. diff and treated with 7 days of vancomycin, Flagyl and Florastor.  She was also given Imodium which initially decreased her diarrhea two to three times per day, but this was ultimately stopped secondary to lack of significant benefit. She also has  a decrease in appetite.  Her symptoms seemed to be worse and approximately week prior to admission when her son noticed that she appeared to be confused and did not know where she was at the nursing home, her labs on January 20, 2011 showed white count of  26.2, sodium 123, BUN of 55 and creatinine 1.54.  She also had a stool culture on 03/23 which was negative as well as a negative abdominal x-ray on December 26, 2010.  She admits to diarrhea and abdominal pain, but denies any other complaints.  ADMISSION PHYSICAL EXAM:  VITAL SIGNS:  Temperature was 94.8 on initial presentation, heart rate was 86, respirations 16, blood pressure was 126/58, oxygen saturation 90% on 2 liters nasal cannula. GENERAL:  This was a pleasant 77 year old female, in no acute distress, alert to person, place, but not time. HEENT:  Normocephalic, atraumatic.  Pupils equal, round, reactive to light.  Extraocular movement intact.  Anicteric sclera. NECK:  Supple with no masses or JVD.  Mucous membranes were dry.  There is small white patch exudates on the buccal membranes. RESPIRATORY:  Clear to auscultation bilaterally. CARDIOVASCULAR:  Irregular rate and rhythm.  No murmurs, rubs or gallops. ABDOMEN:  Soft, tympanic, diffusely tender to the right upper quadrant and right lower quadrant.  No guarding or rebound. EXTREMITIES:  Warm and dry.  Diminished dorsalis pedis and posterior pulses with bilateral lower extremity edema.  Positive venous stasis changes in the bilateral lower extremities as well. SKIN:  Showed decreased turgor, mild erythema and breakdown of skin along the mid sacrum. NEUROLOGIC:  Alert and oriented x2.  Short-term memory impairment noted, appropriately conversant and incoherent.  Sensation was grossly intact, 3+ muscle strength globally and cranial nerves II-XII were intact.  ADMISSION LABORATORIES:  White count was 30.1 with an ANC of 27.4, hemoglobin of 13.0, hematocrit of 37.9, MCV of 82,  platelet count of 392.  Toxic granulation was noted on the smear.  Lactic acid was 1.8. Sodium is 120, potassium is 4.6, chloride was 92, bicarb was 18, glucose was 97, albumin 51, creatinine 1.75, calcium of 8.1.  Urinalysis was positive for small amount of bilirubin and small amount of leukocytes with few epithelial cells noted on microscopic exam with only 3-6 white cells and many bacteria.  HOSPITAL COURSE BY PROBLEM: 1. C diff colitis.  The patient had a significantly elevated white     count and colitis was noted on the CT scan on admission.  The     patient had been previously treated and there was not clear     evidence of any resolution following previous treatment at the     nursing home.  She was started on three drug regimen, including     oral vancomycin 5 mg p.o. q. 6 h., Flagyl IV 500 mg and rifaximin     500 mg b.i.d., this was continued for approximately 7 days and then     rifaximin was discontinued and Flagyl and vancomycin were continued     for another additional 7 days.  Throughout her stay, Ms. Tung     continued to have a slightly distended abdomen, but the abdominal     pain in her lower quadrants did resolve.  Her diarrhea initially     was copious and she was incontinent stool and the Flexi-Seal     was used to control the stool output. Approximately day #7, she     started to have more formed stools, still multiple times per day,     but by the end of the 2 weeks treatment of antibiotics, she was     having only two to three bowel movements daily, which appears to be     her baseline.  They were formed and she was more  aware of when she     was going to have a bowel movement.  She should be continued on an     extended vancomycin taper as noted in the discharge medications for     resolution of her C diff diarrhea and hopeful she avoid any     recurrence of C diff diarrhea.  White count had trended down to the     normal range and she remained afebrile  throughout her admission. 2. Toxic metabolic encephalopathy.  On admission, Ms. Defrancesco was     confused and not very conversant.  She did appear to be oriented to     person and place, but not to time, which family states is     different from her baseline.  They noticed previously whenever she     got a urinary tract infection that she had some problems with her     mental status.  With the resolution of her C diff diarrhea, her     mental status did improve as well as a correction of the uremia     that was noted from her acute renal failure on admission leading     towards a diagnosis of toxic metabolic encephalopathy for her.  On     the date of discharge, she was alert, conversant and oriented,     still only transiently to time and pretty consistently to person,     place and situation. 3. Failure to thrive and debilitation.  Ms. Davia has had several     hospitalizations now since the first of the year she has been     attempting to rehabilitate with hopes of eventually returning home.     She has multiple deficits including difficulty walking, problems     with endurance and also problems with being able to feed herself as     evidence by her low protein, albumin of 1.8 and her presentation,     acute renal failure and hyponatremia secondary to poor oral intake.     Ms. Druckenmiller was encouraged greatly by myself and the nursing staff     as well as the physical therapist and occupational therapist to     participate in her care, but she consistently voiced that she did     not feel that she would be able to eat unless somebody fed her.     The goals of care were discussed with the family and family is     still insisted on trying to see if they can help her rehabilitate     and eventually return home.  I am not exactly confident if that is     going to be possible for her if she continues to not participate in     therapy in a very active way. 4. Atrial fibrillation.  The  patient was in atrial fibrillation     throughout her stay.  When she initially presented with the C diff     colitis, she did appeared to be hypotensive, which is thought to be     due to sepsis from her C diff colitis.  Her diltiazem was     discontinued at that time.  The date after discontinuation of her     diltiazem, her pulse did increase up to the 110s.  She was     restarted on a lower dose of diltiazem 120 mg extended release     daily, which was able to control  both her blood pressure and her     pulse throughout the remainder of her stay.  She remained her rate     controlled on date of discharge.  She was also anticoagulated with     Xarelto on admission.  During admission, she did come down with an     episode of what appeared to be GI bleed from multiple stools,     hemorrhoids and possible lower GI bleed from the inflammation from     the C diff colitis.  The decision was made with the family to     discontinue anticoagulation for her atrial fibrillation and stroke     prevention given the risk factors of bleeding in this patient.     Family would like to entertain the possibility of returning her on     Xarelto in a month or two based on a fear that she is going to     have a stroke and not wanting to have her go through that. It was     discussed with the family that given her CHAD-VAS score     approximately 3-4.  She has approximately a 3%-5% risk over the     next year for stroke and family insisted that even with the     bleeding risk, they went along this, so they would like to restart     the Xarelto in several months.  I will leave this opportunity to     discuss this with the family to her primary care physician, Dr.     Julaine Fusi as we are trying to discuss goals of care for this     very frail and debilitated elderly woman. 5. Bilateral pleural effusions.  Ms. Kem on admission did have     some bilateral pleural effusions and some difficulty with      breathing.  This was thought to be secondary to her failure to     thrive and low albumin level causing a pleural effusion.  These     effusions were followed with x-rays and did appear to decrease in     size by the time she was ready for discharge.  She was not having     any more shortness of breath and was able to maintain her oxygen     saturations off the nasal cannula. 6. Acute renal failure.  On presentation, Ms. Cleaver had a creatinine     of 1.75 and increased BUN as well as hyponatremia, this was thought     to be secondary to poor oral intake with fluid hydration.  Her     kidney function returned to normal within the day of her     hospitalization, she was maintained on oral intake only after an     episode of volume overload from her IV fluids during her     hospitalization.  She will need continued monitoring of her oral     intake to ensure that she is not becoming dehydrated. 7. Abdominal pain and distention secondary to ascites.  Abdomen was     soft, but distended on admission.  There was some concern for     possible tapable ascites for symptom relief of her shortness of     breath.  Ultrasound of the abdomen did show perihepatic and     perisplenic ascites that did appear to be underneath a capsule of     the liver and not free-flowing.  It  was discussed with the     radiologist the possibility of tapping this and they suggested at     this point to just observe and treat symptomatically and if she     became more short of breath, it would be more likely caused by the     pleural effusions as opposed to the ascites and pressure from her     abdomen.  She continues to have some mild right upper quadrant pain     intermittently likely related to the ascites around her liver.     Again the cause of the ascites and fluid leak into her abdomen and     pleural space is thought to be secondary to protein malnutrition as     evidence by them of 1.8 on admission. 8. Anemia.   On admission, Ms. Salinas hemoglobin was 13.0 over the     time of her admission did, with fluid hydration and repeated blood     draws as well as a possible GI bleed from the C diff colitis, did drop several points.  On the date of discharge, she     has hemoglobin of 9.5 and had been stable for approximately 48     hours.  Anemia panel was not performed during this admission     because she had recently had at the end of February showed an iron     of 34, total iron binding capacity of 263, percent saturation of 13     and a folate of 9, B12 506 and a ferritin of 51, all of which     indicates some decreased iron stores.  She was started on iron     supplementation during her stay and will need continued following     of her anemia to ensure that she has returning back to her normal     baseline. 9. Hypothyroidism.  On admission, TSH was checked because it had     several months and the last TSH noted was 13.729 with     a free T4 of 0.88, which is within the normal range.  The patient     is on a thyroid hormone replacement and was increased from her 88     mcg to 100 mcg during her stay.  She will need continued follow-up     of her TSH to ensure that she is well maintained on her     levothyroxine level. 10.Depression and anxiety.  Throughout her stay, once her mental     status cleared, Ms. Fleece did show several signs of depression     including inability to interact with staff as well as problems with     motivation to feed herself and get up and walk with therapy.  There     was some concerns of elderly depression and anxiety were playing     into some of her failure to thrive, her Celexa was increased from     10 mg to 20 mg daily and she will need continued follow-up to     ensure that she is feeling better and to see if we can possibly get     her able to and be involved more in her therapy.  DISCHARGE VITAL SIGNS:  Temperature was 98.4, blood pressure was 113/76, pulse  was 104, respirations were 18, saturating 95% on room air.  DISCHARGE LABORATORY DATA:  White count was 5.9, hemoglobin was 9.4, hematocrit was 28.8, platelets were 390.  Sodium is 137, potassium was 4.9, chloride was 102, bicarb was 31, BUN of 8, creatinine of 0.73, glucose of 122 with calcium of 8.5.    ______________________________ Leodis Sias, MD   ______________________________ Fransisco Hertz, M.D.    CP/MEDQ  D:  02/03/2011  T:  02/03/2011  Job:  [id number]  cc:   Julaine Fusi, MD  Electronically Signed by Leodis Sias MD on 02/05/2011 07:30:42 AM Electronically Signed by Lina Sayre M.D. on 02/10/2011 09:56:12 AM

## 2011-02-11 ENCOUNTER — Inpatient Hospital Stay (HOSPITAL_COMMUNITY): Payer: Medicare Other

## 2011-02-12 LAB — BODY FLUID CULTURE: Culture: NO GROWTH

## 2011-02-14 DIAGNOSIS — I4891 Unspecified atrial fibrillation: Secondary | ICD-10-CM

## 2011-02-15 NOTE — Discharge Summary (Signed)
NAMEKIYANNA, BIEGLER NO.:  [id number]  MEDICAL RECORD NO.:  [id number]           PATIENT TYPE:  I  LOCATION:  2036                         FACILITY:  [facility]  PHYSICIAN:  Fransisco Hertz, M.D.  DATE OF BIRTH:  1924/05/03  DATE OF ADMISSION:  02/06/2011 DATE OF DISCHARGE:  02/11/2011                              DISCHARGE SUMMARY   DISCHARGE DIAGNOSES: 1. Pulmonary edema secondary to fluid overload. 2. Uncontrolled hypertension. 3. Atrial fibrillation with rapid ventricular response. 4. Anemia. 5. Clostridium difficile colitis. 6. Failure to thrive. 7. Hypothyroidism. 8. Depression and anxiety. 9. Osteoarthritis. 10.History of a transient ischemia attack.  DISCHARGE MEDICATIONS: 1. Diltiazem 240 mg take 1 tablet daily by mouth. 2. Ensure chocolate Pudding 1 serving 3 times daily. 3. Ensure chocolate liquid 1 can 3 times daily between meals. 4. Furosemide 80 mg take 1 tablet by mouth daily. 5. Citalopram 20 mg take 1 tablet daily by mouth. 6. Docusate 100 mg tablets take 1 capsule twice daily as needed for     constipation. 7. Ferrous sulfate 325 mg take 1 tablet every morning. 8. Levothyroxine 100 mcg tablets 1 tablet daily by mouth. 9. Multivitamins with mineral take 1 tablet by mouth daily. 10.Polyethylene glycol take 17 g with 4 ounces a liquid daily as     needed for constipation. 11.Florastor take 2 tablets twice daily by mouth. 12.Tylenol 325 mg take 2 tablets daily as needed for pain for 6 hours. 13.Vancomycin 125 mg p.o. daily from February 11, 2011, through Feb 17, 2011, then 125 mg p.o. every other day from Feb 18, 2011, through     Feb 24, 2011, and then vancomycin 125 mg p.o. every 3 days from Feb 19, 2011, through Mar 10, 2011. 14.Zofran 4 mg tablets take 1 tablet by mouth every 6 hours as needed     for nausea.  DISPOSITION AND FOLLOWUP:  Ms. Guzek was discharge from [facility] in stable improved condition.  She  will be discharged to [facility] for rehabilitation with eventual hope of returning home. She have a PCP follow up with Dr. Anderson Malta when she is released from the nursing home.  During her rehabilitation at the nursing home, Ms. Truxillo should work with Physical Therapy as well as Occupational Therapy and Nutrition is to work on building up her reserves and for her general deconditioning.  She should be continued on a tapering vancomycin dose for long-term treatment of her Clostridium difficile infection as dictated in the discharge medications.  She should also have palliative care following her course at [facility].  CONSULTATIONS:  Palliative Care.  PROCEDURES PERFORMED:   1. Chest x-ray on the date of admission shows an increasing moderate-to-large left pleural effusion and moderate right pleural effusion with bibasilar atelectasis.   2. Ultrasound-guided thoracentesis of the left side was performed on February 08, 2011, which yield approximately 680 mL of straw-colored fluid removed, fluid sample was sent for analysis to the laboratory  3. Ultrasound-guided thoracentesis of the right side was performed on February 09, 2011, and showed approximately 1 L  of straw-colored fluid removed.   4. chest x-ray on the date of discharge showed a left apical pneumothorax, smaller but not resolved in the range of approximately 5-10% left pleural effusion is about the same, bibasilar opacities noted at the time of admission.  HISTORY OF PRESENT ILLNESS:  Ms. Paradise is an 77 year old female with a history of atrial fibrillation, CVA, and recent hospitalization for her C. diff colitis currently being treated with vancomycin and Flagyl who presented with 1 day history of acutely worsened shortness of breath. Most of the history was provided by the patient's daughter, though the patient's mental status is reportedly better than ever.  Apparently, since her discharge, the patient has mild  shortness breath which is felt to be secondary to pleural effusion noted during the last hospitalization, this was felt to be secondary to decreased albumin. Unfortunately, today the patient developed worsening cough productive of yellow sputum as well as worsening shortness of breath.  The patient was brought to the Emergency Department where she was reported to be in AFib with RVR.  However, this has been resolved, but the patient was evaluated by the Internal Medicine Team.  The patient was then discharged only to return to her SNEF and redeveloped symptoms.  EMS was contacted and the patient was returned to the ED.  Blood pressure on admission was 210/83 and an initial EKG showed a rate of 98 and atrial fibrillation.  The patient's BP had returned to baseline without intervention at the time of evaluation.  Currently, the patient denies any fevers, chills, dysuria, or chest pain.  ADMISSION PHYSICAL EXAMINATION:  VITAL SIGNS:  Temperature was 97.9, blood pressure was 210/82, pulse was 94, respirations 22, and saturating 99% on 4 L of oxygen. GENERAL:  She was alert, cachectic elderly female, cooperative with examination. HEENT:  Head was normocephalic, atraumatic.  Eyes, vision was grossly intact.  Pupils were equal, round, and reactive to light.  No injection or anicteric sclera.  Mouth, pharynx is pink and slightly dry.  No erythema or exudates. NECK:  Supple.  Full range of motion.  No JVD or thyromegaly.  No carotid bruits. LUNGS:  Normal respiratory effort.  No accessory muscle use.  There is diffuse coarse breath sounds and expiratory wheezes bilaterally. HEART:  Regular rate, slightly irregular rhythm, 2/6 systolic murmur. No rubs or gallops. ABDOMEN:  Soft, nontender.  Normal bowel sounds.  Mild distention.  No guarding.  No rebound tenderness.  No hepatomegaly or splenomegaly. MUSCULOSKELETAL:  No joint swelling or erythema.  No redness over the joints.  Pulses were 2+  in the DP and PT pulses bilaterally. EXTREMITIES:  No cyanosis, clubbing, or edema. NEUROLOGIC:  She is alert and oriented x3.  Cranial nerves II through XII were intact.  Strength is normal in all extremities.  Sensation is intact to light touch. SKIN:  Turgor was normal.  No rashes. PSYCHIATRIC:  Decreased interaction, though good eye contact.  Non- anxious appearing, not depressed appearing.  ADMISSION LABORATORY DATA:  White count was 10.2, hemoglobin was 9.7, hematocrit was 30, MCV was 87.5, platelets were 347, and ANC was 9.0. Sodium is 136, potassium is 4.2, chloride was 99, bicarb was 33, glucose was 121, BUN was 10, creatinine was 0.73, calcium was 8.6.  point of care cardiac markers showed troponin less than 0.05.  Urinalysis was positive for small amount of leukocytes, but only rare.  Has squamous epithelial cells and rare bacteria noted.  HOSPITAL COURSE: 1. Acute shortness of breath caused  by pulmonary edema.  This was     thought to be likely secondary to increased pulmonary edema and     pleural effusion secondary to poor oncotic pressure from decreased     albumin and her deconditioning. The worsening of the shortness of     breath is possibly triggered by the episode of atrial fibrillation with RVR as     well as fluid administration in the Emergency Department.  The     patient was diuresed with Lasix.  Throughout the course, she also     had  palliative thoracocentesis bilaterally removing     approximately 1-1/2 L of fluid from both lungs in total.  Over the     course of her hospitalization, her breathing did get significantly     better.  She continues to have some reaccumulation of fluid likely     from the original problem.  Fluid analysis did show an LDH of 70     and the fluid and a plasma LDH of 160, protein was less than 3 in     the fluid and 4.9 indicating this was a transudative effusion,     again likely secondary to possible decreased heart function as  well     as decreased oncotic pressure from low albumin.  Echocardiogram     done during hospitalization did show normal EF of approximately 60-     65% with normal wall motion.  No regional abnormalities.  Mild     regurgitation of the aortic valve and a calcified annulus of the     mitral valve, but no increased pulmonary artery pressure or     diastolic dysfunction was indicated on the 2-D echo.  She will be     discharged on daily Lasix dose to try to keep the fluid from     reaccumulating and we will likely need to have this palliatively     tapped while she works on trying to get her nutrition and     protein status better which should decrease the rate of the fluid     reaccumulates. 2. Hypertension.  On admission, her blood pressure was definitely out     of control with a blood pressure of 210/83.  Her diltiazem was     increased throughout her stay and actually on the date of     discharge, her blood pressure did return to normal, also she was     diuresed to try to remove some of the fluids.  The increased     hypertension was thought to be due to fluid overload and did     respond quite well to diuresis. 3. AFib.  She did have a run of AFib with RVR after she was discharged     from hospitalization previously. Her diltiazem had been decreased     because of a symptomatic bradycardia as well as a low hypotension,     but it appears she likely needs to be on a higher dose of diltiazem.  Her     diltiazem was titrated up during her stay to 240 mg extended     release tablet daily.  She has been running in approximately 60s to     70s for her pulse rate and her blood pressure is much better     controlled on the diltiazem dose that she is on currently.  She     will likely need to be maintained on this dose if  not possibly     increased further depending on the long-term control of her atrial     fibrillation.  She is currently not on anticoagulation at this time     simply  because she has had a GI bleed in recent past.  It is     possible that she may be able to go onto anticoagulation eventually     in the future, but this will be likely several months before she is     able to go back onto it. 4. C diff diarrhea.  She was treated in hospitalization recently for C     diff diarrhea on getting approximately 15 days of vancomycin     orally, Flagyl as well as a week and Rifaximin.  She was discharged     on a extended vancomycin taper and this was continued here in the     hospital.  She should continue the taper in the nursing home     starting now going to vancomycin 125 mg p.o. daily.  During her     hospitalization, she does continued to have some mildly loose     stools, but no frank diarrhea.  She is incontinent, but she also     was debilitated and unable to really get herself to the bathroom in     time. 5. Anemia.  Her hemoglobin during her last hospitalization had     decreased down to 9.5 on discharge it was thought to be secondary     to volume administration as well as a possible slow GI bleed from     the inflammation from her C diff colitis as well as some known     hemorrhoids that she has.  Throughout her stay, her hemoglobin had     remained stable and there was no evidence of any recurrence of the     GI bleed, but was quite well. 6. Debilitation.  Ms. Verbeke is a very debilitated elderly lady who     needs a lot of physical therapy and rehabilitation to get her back     to her baseline.  Family insisted that she we be able to be     rehabilitated with eventual hope of getting her home and I do not     know if that is completely possible, which continued to have goals     of care, talk with the family as well as discussing a no hospital     transfer in a most form to best serve this elderly lady. 7. Abdominal pain and distention secondary to ascites.  This was     discovered during her last hospitalization and has been stable      throughout her hospitalization.  At this time, she does have     occasional pain on the right upper quadrant likely secondary to the     para pack and perisplenic ascites, but this did not appear to be     expanding and she has actually been well-controlled with simply     Tylenol. 8. Hypothyroidism.  She was maintained on normal home dose of 100 mcg     daily levothyroxine.  She will need a repeat TSH done in     approximately 4-6 weeks. 9. Depression and anxiety.  She was kept on her Celexa 20 mg daily     which was started during her last hospitalization.  She will likely     need another 4-6 weeks to  see the full effect of her medication and     likely needed titrated up or switched to the Medicine if this one     deemed to be not effective.  DISCHARGE PHYSICAL EXAMINATION:  VITAL SIGNS:  Temperature was 98.4, blood pressure was 122/64, pulse was 76, respirations 18, and saturating 92% on room air.  There were no discharge laboratories done.    ______________________________ Leodis Sias, MD   ______________________________ Fransisco Hertz, M.D.    CP/MEDQ  D:  02/11/2011  T:  02/11/2011  Job:  [id number]  cc:   Edsel Petrin, D.O.  Electronically Signed by Leodis Sias MD on 02/13/2011 07:09:41 AM Electronically Signed by Lina Sayre M.D. on 02/15/2011 06:56:27 PM

## 2011-03-01 NOTE — Procedures (Signed)
LOWER EXTREMITY VENOUS REFLUX EXAM   INDICATION:  Left lower extremity swelling and pain with a slow-healing  ulcer on the medial aspect of the ankle.   EXAM:  Using color-flow imaging and pulse Doppler spectral analysis, the  left common femoral, superficial femoral, popliteal, posterior tibial,  greater and lesser saphenous veins are evaluated.  There is no evidence  suggesting deep venous insufficiency in the left lower extremity.   The left saphenofemoral junction is not competent.  The left GSV is not  competent with the caliber as described below.   The left proximal short saphenous vein shows evidence of thickened walls  and minimal flow proximally, with connections in calf to the  varicosities.   GSV Diameter (used if found to be incompetent only)                                            Right    Left  Proximal Greater Saphenous Vein           cm       0.65 cm  Proximal-to-mid-thigh                     cm       0.61 cm  Mid thigh                                 cm       0.59 cm  Mid-distal thigh                          cm       0.59 cm  Distal thigh                              cm       0.59 cm  Knee                                      cm       0.49 cm   IMPRESSION:  1. The left greater saphenous vein reflux is identified with the      caliber ranging from 0.49 cm to 0.65 cm knee to groin with numerous      varicosities in the calf.  2. The left greater saphenous vein is not aneurysmal.  3. The left greater saphenous vein is not tortuous in the thigh.  4. There is no evidence of significant deep venous insufficiency in      the left lower extremity.  5. The deep venous system is competent.  6. The left lesser saphenous vein shows evidence in thickened walls      with minimal flow proximally.  However shows connections into the      calf varicosities mid calf.  7. No evidence of deep venous thrombosis in the left lower extremity.   ___________________________________________  V. Charlena Cross, MD   AS/MEDQ  D:  11/12/2007  T:  11/13/2007  Job:  [id number]

## 2011-03-01 NOTE — Assessment & Plan Note (Signed)
OFFICE VISIT   [name] [name], [name] [name]  DOB:  06-29-1924                                       11/19/2007  CHART#:[id number]   The patient returns today for further evaluation of her venous stasis  ulcer in the left ankle.  She has worn an Radio broadcast assistant for the last week  and this was changed today.  The ulcer continues to measure about 2 to 3  cm in diameter with a painful eschar overlying this.  She does have  palpable pulses in the left foot.  She has large varicosities over the  greater saphenous system in the left calf, and she has gross reflux at  the left saphenofemoral junction and throughout the left greater  saphenous vein.   In order to heal the ulcer, she will need laser ablation of the left  greater saphenous vein.  We will schedule laser ablation for next week,  February 9, of her left greater saphenous vein with multiple stab  phlebectomies in the left calf at the same setting.  She will  discontinue her Coumadin after Wednesday's dose (February 4), and the  procedure will be done on February 9.  I discussed this at length with  her and her daughter today.  Blood pressure 113/63, heart rate 67.   James D. Hart Rochester, M.D.  Electronically Signed   JDL/MEDQ  D:  11/19/2007  T:  11/20/2007  Job:  [id number]

## 2011-03-01 NOTE — Assessment & Plan Note (Signed)
OFFICE VISIT   [name] [name], [name] [name]  DOB:  [birth year]                                       11/12/2007  ZOXWR#:[id number]   Referring physician is Dr. Noel Gerold at [facility].   REASON FOR VISIT:  Left leg ulcer.   HISTORY:  This is an 77 year old female who I am seeing at the request  of Dr. Noel Gerold for evaluation of left leg ulcer.  The patient states that  for the past 3-4 weeks, she has had an ulcer on the left side of her  ankle.  This has been very painful.  It has been associated with a  significant amount of swelling.  She has been treated with oral  antibiotics alone.  Her pain is constant.  Her daughter states that the  swelling has been very severe.  She has no known history of DVT.  She  has no history of pulmonary embolism.  The ulcer has not improved with  treatment to date.   REVIEW OF SYSTEMS:  GENERAL:  Negative for weight gain or weight loss.  CARDIAC:  Negative.  PULMONARY:  Negative.  GI:  Negative.  GU:  Negative.  VASCULAR:  Pain in legs with walking.  Pain when lying flat.  Nonhealing  ulcer.  History of stroke.  NEURO:  Negative.  ORTHO:  Negative.  SKIN:  Negative.  PSYCH:  Negative.  ENT:  Negative.  HEME:  Negative.   PAST MEDICAL HISTORY:  Positive for hypertension.  Also significant for  history of a mild stroke.   PAST SURGICAL HISTORY:  Negative.   FAMILY HISTORY:  Negative for cardiovascular disease.   SOCIAL HISTORY:  She is widowed with two children.  She does not smoke.  She has never smoked.  She does not drink alcohol.   MEDICATIONS:  1. Coumadin 3.5 mg p.o. daily.  2. Synthroid 100 mcg p.o. daily.  3. Lasix 40 mg p.o. daily.  4. Diltiazem 240 mg p.o. daily.  5. Enalapril 20 mg p.o. daily.  6. Metoprolol 100 mg daily.   ALLERGIES:  None.   PHYSICAL EXAMINATION:  Vital signs:  Heart rate is 59.  Blood pressure  114/63.  General:  She is well-appearing in no acute distress.  HEENT:  Normocephalic and atraumatic.  Pupils are equal.  Sclerae are anicteric.  Neck is supple without mass.  Trachea is midline.  Cardiovascular:  Regular rate and rhythm.  No murmurs, rubs or gallops.  Pulmonary:  Lungs are clear to auscultation bilaterally.  Abdomen is soft and  nontender.  Extremities reveal faintly palpable dorsalis pedis pulse  bilaterally.  There is a 2 cm ulcer on the medial aspect of her left  ankle.  There is an eschar surrounding it with mild erythema.  Psych:  She is alert and oriented x3.  Neuro:  Cranial nerves II-XII are grossly  intact.   DIAGNOSTIC STUDIES:  Patient comes with a Doppler ultrasound.  This  states that she has ankle brachial indices of 0.96 on the right and 1.02  on the left.  They do, however, comment that these may be falsely  elevated due to calcified vessels, as she has monophasic waveforms  suggesting tibial disease.   A venous reflux study was done today.  This reveals reflux in the left  greater saphenous vein.  She has prominent varicosities  in the mid calf.  There is no evidence of DVT.   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  Her ulcer measures approximately 2 x 3 cm with eschar and a  small amount of erythema.  She is significantly limited by pain.  The  patient has never worn compression stocking; however, she is not a  candidate at this time due to the pain and discomfort she is having from  her ulcer at this time.  I have recommended that she go into an [employer]na  boot and be scheduled for laser ablation of her left greater saphenous  vein and stab phlebectomy of her varicosities on the left.  This will be  performed by Dr. Hart Rochester.   Jorge Ny, MD  Electronically Signed   VWB/MEDQ  D:  11/12/2007  T:  11/13/2007  Job:  [id number]   cc:   Valetta Close, M.D.

## 2011-03-01 NOTE — Assessment & Plan Note (Signed)
OFFICE VISIT   [name] [name], [name] [name]  DOB:  June 10, 1924                                       11/28/2007  CHART#:[id number]   The patient is 2 days status post laser ablation and stab phlebectomy of  her left leg by Dr. Hart Rochester.  Her daughter reports that she had marked  swelling and pain today and had to present for evaluation.  She actually  has the usual amount of mild swelling at the level of her calf, and mild  erythema in her laser ablation site throughout her thigh.  Her stab  phlebectomy sites look quite good.  I did do a limited duplex, and she  had a widely patent popliteal without evidence of clot, widely patent  common femoral vein with occluded saphenous vein.  This was a no-charge  study.  She was reassured, as was her daughter present.  They will see  Dr. Hart Rochester as planned for repeat duplex and followup on February 17.   Larina Earthly, M.D.  Electronically Signed   TFE/MEDQ  D:  11/28/2007  T:  11/30/2007  Job:  [id number]   cc:   Quita Skye. Hart Rochester, M.D.

## 2011-03-01 NOTE — Assessment & Plan Note (Signed)
OFFICE VISIT   [name] [name], [name] [name]  DOB:  09/26/1924                                       12/04/2007  CHART#:[id number]   Patient returns one week post laser ablation of the left greater  saphenous vein with multiple stab phlebectomies.  She has had mild  discomfort in the left thigh over the laser ablation site and some  resolving edema.  The swelling which she developed two days following  the procedure has pretty much resolved.  She has been wearing her  elastic compression stockings.  There has been no pain in the stab  phlebectomy sites.   Venous duplex exam was performed by me today, and her greater saphenous  vein is totally occluded from the saphenofemoral junction  to the knee  with no flow, and the deep venous system is widely patent with normal-  appearing flow.   The eschar was removed from the stasis ulcer, and the ulcer is  essentially healed beneath this scab.  I have recommended that she  continue to wear an elastic compression stocking but can switch this to  a short leg stocking, and I will see her back in three months for  further followup with a final venous duplex exam.   Quita Skye. Hart Rochester, M.D.  Electronically Signed   JDL/MEDQ  D:  12/04/2007  T:  12/06/2007  Job:  [id number]

## 2011-03-01 NOTE — Discharge Summary (Signed)
NAMEKAHLEY, LEIB NO.:  [id number]   MEDICAL RECORD NO.:  [id number]          PATIENT TYPE:  INP   LOCATION:  5530                         FACILITY:  [facility]   PHYSICIAN:  Manning Charity, MD     DATE OF BIRTH:  11-Nov-1923   DATE OF ADMISSION:  10/27/2008  DATE OF DISCHARGE:  10/28/2008                               DISCHARGE SUMMARY   CONTINUITY DOCTOR:  Dr. Phillips Odor, outpatient clinic and Dr. Michaell Cowing,  [facility].   CONSULTANT:  None.   DISCHARGE DIAGNOSIS:  Urinary tract infection.   OTHER DIAGNOSES:  1. Inguinal hernia.  2. Peripheral vascular disease.  3. History of cerebrovascular accident.  4. Osteoarthritis.  5. Hypothyroidism.  6. Hypertension.  7. Hyperlipidemia.  8. Atrial fibrillation on Coumadin.  9. History of recurrent urinary tract infection.  10.Status post transvaginal sling for uterine prolapse.   DISCHARGE MEDICATIONS:  1. Synthroid 100 mcg take one tablet once a day.  2. Diltiazem 240 mg one tablet once a day.  3. Enalapril 20 mg one tablet once a day.  4. Lasix 40 mg one tablet once a day.  5. Warfarin 2 mg take 2.5 tablets daily, except for Wednesday the      patient will take 2 tablets.  6. Ensure one can 2-3 times daily.  7. Ciprofloxacin 250 mg one tablet twice a day for 2 more days.   PROCEDURE PERFORMED:  1. CT abdomen and pelvis show moderate right side inguinal hernia      containing distal small bowel.  There are no definite signs of      bowel incarceration or obstruction.  2. Sigmoid diverticulosis without adjacent inflammation.  CT abdomen,      no evidence of bowel obstruction, distally parenchyma appears      unremarkable, diffuse lumbar spondylosis.   BRIEF HISTORY OF PRESENT ILLNESS:  This is an 77 year old with past  medical history significant for peripheral vascular disease, recurring  urinary tract infection, CVA, hypertension, hypothyroidism, AFib who  presented at outpatient clinic for INR followup.   The patient was  complaining of suprapubic discomfort, increased urinary frequency and  worsening urinary incontinence over the last week.  She also relates  occasional once a month right side lower quadrant abdominal pain extreme  in nature.  She relates mild discomfort on the right side of her  abdomen.  Per son, the patient is minimizing her pain.  He said she was  complaining of abdominal pain at home.  She relates on and off diarrhea.  Denies nausea or vomiting.   PHYSICAL EXAMINATION:  VITAL SIGNS:  Temperature 97, blood pressure  154/75, pulse 63, weight 127, respirations 20, sat 98% on room air.  GENERAL:  This is a pleasant 77 year old in no acute distress.  EYES:  PERRLA.  Anicteric.  NECK:  Supple.  No thyromegaly.  RESPIRATION:  Bilateral good air movement.  No rhonchi.  Mild fine  crackles at the base.  No wheezing.  CARDIOVASCULAR:  S1 and S2.  Irregular rhythm and rate.  Mild positive  murmur in the aortic area 2/6.  GASTROINTESTINAL:  Bowel  sounds positive.  It was soft.  No rigidity, no  guarding, no rebound.  Mildly tender on palpation on the right thigh.  Suprapubic tenderness on palpation.  EXTREMITIES:  Pulses positive.  On the right ankle, she has +2 edema.  GENITOURINARY:  No CVA tenderness.  NEURO:  Alert and oriented.  Cranial nerves intact.  Motor strength 4/5  throughout.  Sensation grossly intact.   ADMISSION LAB:  INR 2.5, lactic acid 0.5.  UA leukocytes, sodium 138,  potassium 3.4, chloride 100, bicarb 28, BUN 16, creatinine 0.80, glucose  91, bilirubin 44, alkaline phosphatase 52, AST 23, ALT 15, protein 6.3,  albumin 3.4, calcium 9.4, MCV 89, white blood cell 6.8, hemoglobin 11.3,  platelet 212, hematocrit 33.6.  TSH 3.699.  UA positive nitrite,  moderate leukocytes, rare epithelial cells, many bacteria.   HOSPITAL COURSE:  1. Inguinal hernia.  The patient was admitted to rule out incarcerated      inguinal hernia.  CT abdomen which showed up above  was negative.      On repeated physical exam, the patient was not having pain on      palpation on the side of the inguinal hernia.  Lactic acid was      within normal limits 0.5, which ruled out tissue ischemia.  During      the next day of hospitalization, the patient's abdominal pain      resolved and she was discharged in good condition.  2. Urinary tract infection.  The patient was found to have a urinary      tract infection.  She was having the clinical symptoms of UTI:      increased frequency, dysuria.  UA was positive.  She was started on      ciprofloxacin.  She had, therefore, uncomplicated urinary tract      infection treated with ciprofloxacin 250 p.o. b.i.d.  Ciprofloxacin      increase warfarin function.  So, the patient will follow at the      outpatient clinic on October 30, 2008, with Dr. Michaell Cowing at 2 p.m. to      make sure that her INR is not supratherapeutic.  Also, urine      culture result for sensitivity, need to follow as an outpatient.   1. Hypothyroidism.  We will continue with Synthroid.  Her TSH was      within normal limits at 3.699.   1. Hypertension.  The patient was continued with diltiazem.  We held      the lisinopril and the Lasix in the hospital because the patient      received contrast for the CT abdomen.  We discharged the patient on      her Lasix and lisinopril.  Her creatinine on the day of discharge      was 0.8.  2. On the day of discharge, Ms. Heatley was feeling very well.  She      denies abdominal pain.   LABS ON THE DAY OF DISCHARGE:  Sodium 138, potassium 3.4 this was  repleted, chloride 100, bicarb 28, BUN 16, creatinine 0.8, glucose 91,  INR 2.5, white blood cell 6.8, hemoglobin 11.3, hematocrit 83, platelet  212.   Social worker was consulted and it was arranged for the patient to  receive home health.  The patient was discharged in good condition.      Hartley Barefoot, MD  Electronically Signed      Manning Charity, MD   Electronically Signed  BR/MEDQ  D:  10/29/2008  T:  10/30/2008  Job:  [id number]

## 2011-03-01 NOTE — Assessment & Plan Note (Signed)
OFFICE VISIT   [name] [name], [name] [name]  DOB:  09-16-1924                                       11/19/2007  CHART#:[id number]   NO DICTATION   Quita Skye. Hart Rochester, M.D.  Electronically Signed   JDL/MEDQ  D:  11/19/2007  T:  11/20/2007  Job:  [id number]

## 2011-03-12 ENCOUNTER — Inpatient Hospital Stay (HOSPITAL_COMMUNITY)
Admission: EM | Admit: 2011-03-12 | Discharge: 2011-03-22 | DRG: 372 | Disposition: A | Discharge status: Skilled Nursing Facility | Payer: Medicare Other | Attending: Internal Medicine | Admitting: Internal Medicine

## 2011-03-12 ENCOUNTER — Encounter: Payer: Self-pay | Admitting: Internal Medicine

## 2011-03-12 DIAGNOSIS — I4891 Unspecified atrial fibrillation: Secondary | ICD-10-CM | POA: Diagnosis present

## 2011-03-12 DIAGNOSIS — A0472 Enterocolitis due to Clostridium difficile, not specified as recurrent: Principal | ICD-10-CM | POA: Diagnosis present

## 2011-03-12 DIAGNOSIS — F411 Generalized anxiety disorder: Secondary | ICD-10-CM | POA: Diagnosis present

## 2011-03-12 DIAGNOSIS — F3289 Other specified depressive episodes: Secondary | ICD-10-CM | POA: Diagnosis present

## 2011-03-12 DIAGNOSIS — M1990 Unspecified osteoarthritis, unspecified site: Secondary | ICD-10-CM | POA: Diagnosis present

## 2011-03-12 DIAGNOSIS — A498 Other bacterial infections of unspecified site: Secondary | ICD-10-CM | POA: Diagnosis present

## 2011-03-12 DIAGNOSIS — E039 Hypothyroidism, unspecified: Secondary | ICD-10-CM | POA: Diagnosis present

## 2011-03-12 DIAGNOSIS — N390 Urinary tract infection, site not specified: Secondary | ICD-10-CM | POA: Diagnosis present

## 2011-03-12 DIAGNOSIS — F329 Major depressive disorder, single episode, unspecified: Secondary | ICD-10-CM | POA: Diagnosis present

## 2011-03-12 DIAGNOSIS — J90 Pleural effusion, not elsewhere classified: Secondary | ICD-10-CM | POA: Diagnosis present

## 2011-03-12 LAB — COMPREHENSIVE METABOLIC PANEL
Albumin: 2.8 g/dL — ABNORMAL LOW (ref 3.5–5.2)
Alkaline Phosphatase: 60 U/L (ref 39–117)
BUN: 39 mg/dL — ABNORMAL HIGH (ref 6–23)
CO2: 27 mEq/L (ref 19–32)
Chloride: 101 mEq/L (ref 96–112)
GFR calc non Af Amer: 53 mL/min — ABNORMAL LOW (ref >60)
Glucose, Bld: 109 mg/dL — ABNORMAL HIGH (ref 70–99)
Potassium: 3.5 mEq/L (ref 3.5–5.1)
Total Bilirubin: 0.3 mg/dL (ref 0.3–1.2)

## 2011-03-12 LAB — URINALYSIS, ROUTINE W REFLEX MICROSCOPIC
Ketones, ur: NEGATIVE mg/dL
Nitrite: NEGATIVE
Protein, ur: NEGATIVE mg/dL
Urobilinogen, UA: 0.2 mg/dL (ref 0.0–1.0)

## 2011-03-12 LAB — CBC
MCH: 28.2 pg (ref 26.0–34.0)
MCHC: 32.5 g/dL (ref 30.0–36.0)
Platelets: 300 10*3/uL (ref 150–400)

## 2011-03-12 LAB — DIFFERENTIAL
Basophils Absolute: 0 10*3/uL (ref 0.0–0.1)
Basophils Relative: 0 % (ref 0–1)
Eosinophils Absolute: 0 10*3/uL (ref 0.0–0.7)
Monocytes Absolute: 0.9 10*3/uL (ref 0.1–1.0)
Monocytes Relative: 5 % (ref 3–12)
Neutrophils Relative %: 93 % — ABNORMAL HIGH (ref 43–77)

## 2011-03-12 LAB — LACTIC ACID, PLASMA: Lactic Acid, Venous: 1.1 mmol/L (ref 0.5–2.2)

## 2011-03-12 NOTE — Progress Notes (Signed)
Hospital Admission Note Date: 03/12/2011  Patient name: [name] [name] Medical record number: [id number] Date of birth: 1924/09/14 Age: 77 y.o. Gender: female PCP: Anderson Malta, DO  Medical Service:  Attending physician:  Dr. Phillips Odor   Resident (R2/R3):  Dr. Baltazar Apo [phone] Resident (R1):  Dr. Narda Bonds [phone]  Chief Complaint: diarrhea for 2 days with fevers  History of Present Illness:  Pt is 77 yo female with PMH outlined below who presents to [facility] ED with main concern of acute onset of loose, dark stools that started 2 days ago and associated with fever of 101 F. She has just recently completed treatment with vanc, flagyl, and rifaximin  for C. Diff colitis. She has been having mild epigastric pain and has vomited few times, nonbloody. She denies any specific aggravating or alleviating factors. Denies any urinary concerns, no chest pain or SOB, no other systemic concerns.  Medication Sig  . carbamide peroxide (DEBROX) 6.5  Place 5 drops into the right ear BID  . citalopram (CELEXA) 20 MG tablet Take 20 mg by mouth daily.    Marland Kitchen diltiazem120 MG 24 hr capsule Take 120 mg by mouth daily.    Marland Kitchen docusate sodium 100 MG capsule Take 100 mg by mouth BID  . ENSURE (ENSURE) Take 237 mLs by mouth TID  . ferrous sulfate 325  MG tablet Take 325 mg by mouth daily   . levothyroxine (SYNTHROID, LEVOTHROID) 100 MCG tablet Take 100 mcg by mouth daily.    . Lidocaine-Hydroc  3-0.5 % CREA Apply 4 times daily as needed   . ondansetron (ZOFRAN) 4 MG tablet Take 4 mg by mouth Q8    Allergies: Penicillins  Past Medical History: Diagnosis  . Renal insufficiency  . Hypertension  . A-fib  . Anxiety  . Depression  . Hypothyroidism  . Inguinal hernia  . Osteoarthritis  . Cerebral vascular accident  . DJD (degenerative joint disease)  . Recurrent UTI   Past Surgical History: Procedure  . Inguinal hernia repair  . Bladder surgery   Family History: Problem  . Coronary artery disease  .  Hypertension  . Stroke  . Colon cancer   Social History  . Marital Status: Divorced   Social History Main Topics  . Smoking status: Former Smoker    Types: Cigarettes    Quit date: 10/17/1960  . Alcohol Use: No  . Drug Use: No   Review of Systems: Per HPI  Physical Exam: T = 99.7 F BP = 99/67 R = 21 P = 116 O2 sat = 99% on 2 L [place]  Constitutional: Vital signs reviewed.  Patient is a well-developed and well-nourished in no acute distress and cooperative with exam. A&O x3.  Head: Normocephalic and atraumatic Ear: TM normal bilaterally Mouth: no erythema or exudates, MMM Eyes: PERRL, EOMI, conjunctivae normal, No scleral icterus.  Neck: Supple, Trachea midline normal ROM, No JVD, mass, thyromegaly, or carotid bruit present.  Cardiovascular: irregular rate and rhythm, S1 normal, S2 normal, no MRG, pulses symmetric and intact bilaterally Pulmonary/Chest: CTAB, no wheezes, rales, or rhonchi, decreased BS at the left post base Abdominal: Soft. Mild LLQ tenderness and mild distension, bowel sounds are normal, no masses, organomegaly, or guarding present.  GU: no CVA tenderness Musculoskeletal: No joint deformities, erythema, or stiffness, ROM full and no nontender Hematology: no cervical, inginal, or axillary adenopathy.  Neurological: A&O x3, Strenght is normal and symmetric bilaterally, cranial nerve II-XII are grossly intact, no focal motor deficit, sensory intact to light touch bilaterally.  Skin: Warm, dry and intact. No rash, cyanosis, or clubbing.  Psychiatric: Normal mood and affect. speech and behavior is normal. Judgment and thought content normal. Cognition and memory are normal.   Lab results: WBC                                      18.9       h      4.0-10.5         K/uL  RBC                                      4.04              3.87-5.11        MIL/uL  Hemoglobin (HGB)                         11.4       l      12.0-15.0        g/dL  Hematocrit (HCT)                          35.1       l      36.0-46.0        %  MCV                                      86.9              78.0-100.0       fL  MCH -                                    28.2              26.0-34.0        pg  MCHC                                     32.5              30.0-36.0        g/dL  RDW                                      15.7       h      11.5-15.5        %  Platelet Count (PLT)                     300               150-400          K/uL  Color, Urine                             YELLOW            YELLOW  Appearance  CLOUDY     a      CLEAR  Specific Gravity                         1.019             1.005-1.030  pH                                       5.5               5.0-8.0  Urine Glucose                            NEGATIVE          NEG              mg/dL  Bilirubin                                NEGATIVE          NEG  Ketones                                  NEGATIVE          NEG              mg/dL  Blood                                    NEGATIVE          NEG  Protein                                  NEGATIVE          NEG              mg/dL  Urobilinogen                             0.2               0.0-1.0          mg/dL  Nitrite                                  NEGATIVE          NEG  Leukocytes                               LARGE      a      NEG   Squamous Epithelial / LPF                FEW        a      RARE  WBC / HPF                                21-50             <  3               WBC/hpf  RBC / HPF                                0-2               <3               RBC/hpf  Bacteria / HPF                           MANY       a      RARE   Sodium (NA)                              137               135-145          mEq/L  Potassium (K)                            3.5               3.5-5.1          mEq/L  Chloride                                 101               96-112           mEq/L  CO2                                      27                 19-32            mEq/L  Glucose                                  109        h      70-99            mg/dL  BUN                                      39         h      6-23             mg/dL  Creatinine                               0.99              0.4-1.2          Mg/dL   Bilirubin, Total                         0.3  0.3-1.2          mg/dL  Alkaline Phosphatase                     60                39-117           U/L  SGOT (AST)                               16                0-37             U/L  SGPT (ALT)                               11                0-35             U/L  Total  Protein                           6.2               6.0-8.3          g/dL  Albumin-Blood                            2.8        l      3.5-5.2          g/dL  Calcium                                  9.3               8.4-10.5         Mg/dL   Lipase                                   25                11-59            U/L  Lactic Acid, Venous                      1.1               0.5-2.2          Mmol/L  Imaging results:  pending  Assessment & Plan by Problem:  1) Fever, abdominal pain, diarrhea - differential includes recurrence of C.diff colitis vs diverticulitis, vs viral gastroenteritis. Since recently completed tx for C.Diff the plan is to obtain C.Diff PCR and treat if positive. Will obtain Ct of the abd/pelvis with contrast to further evaluate for colitis. Will continue supportive care with IVF, antiemetics, analgesia (tylenol).  2) UTI - will treat with Rocephin empirically however, the patient may be just chronically colonized and the fact she has no urinary symptoms and has recurrent UTIs will monitor her vitals and leukocytosis and urine cultures, switch to PO abx if improving  3) A-fib with RVR - will cont half of her home dose  Diltiazem, monitor on telemetry, follow up on EKG  4) Deconditioning - PT/OT and ensure supplementation   Iskra Magick   [employer]

## 2011-03-13 ENCOUNTER — Emergency Department (HOSPITAL_COMMUNITY): Payer: Medicare Other

## 2011-03-13 ENCOUNTER — Encounter (HOSPITAL_COMMUNITY): Payer: Self-pay | Admitting: Radiology

## 2011-03-13 DIAGNOSIS — R197 Diarrhea, unspecified: Secondary | ICD-10-CM

## 2011-03-13 LAB — BASIC METABOLIC PANEL
CO2: 26 mEq/L (ref 19–32)
Calcium: 8.7 mg/dL (ref 8.4–10.5)
Creatinine, Ser: 0.88 mg/dL (ref 0.4–1.2)
GFR calc Af Amer: >60 mL/min (ref >60)
Glucose, Bld: 101 mg/dL — ABNORMAL HIGH (ref 70–99)

## 2011-03-13 LAB — CBC
Hemoglobin: 10.7 g/dL — ABNORMAL LOW (ref 12.0–15.0)
MCH: 29 pg (ref 26.0–34.0)
MCHC: 33.3 g/dL (ref 30.0–36.0)

## 2011-03-13 MED ORDER — IOHEXOL 300 MG/ML  SOLN
80.0000 mL | Freq: Once | INTRAMUSCULAR | Status: AC | PRN
  Administered 2011-03-13: 80 mL via INTRAVENOUS

## 2011-03-14 LAB — BASIC METABOLIC PANEL
BUN: 21 mg/dL (ref 6–23)
Calcium: 9 mg/dL (ref 8.4–10.5)
Creatinine, Ser: 0.81 mg/dL (ref 0.4–1.2)
GFR calc non Af Amer: >60 mL/min (ref >60)

## 2011-03-14 LAB — CBC
MCH: 28.5 pg (ref 26.0–34.0)
MCV: 87.7 fL (ref 78.0–100.0)
Platelets: 233 10*3/uL (ref 150–400)
RDW: 15.7 % — ABNORMAL HIGH (ref 11.5–15.5)
WBC: 11 10*3/uL — ABNORMAL HIGH (ref 4.0–10.5)

## 2011-03-14 LAB — GLUCOSE, CAPILLARY: Glucose-Capillary: 92 mg/dL (ref 70–99)

## 2011-03-15 DIAGNOSIS — R197 Diarrhea, unspecified: Secondary | ICD-10-CM

## 2011-03-15 LAB — BASIC METABOLIC PANEL
BUN: 18 mg/dL (ref 6–23)
CO2: 27 mEq/L (ref 19–32)
Calcium: 9.1 mg/dL (ref 8.4–10.5)
Chloride: 107 mEq/L (ref 96–112)
Creatinine, Ser: 0.72 mg/dL (ref 0.4–1.2)
GFR calc Af Amer: >60 mL/min (ref >60)
Glucose, Bld: 82 mg/dL (ref 70–99)
Sodium: 138 mEq/L (ref 135–145)

## 2011-03-15 LAB — CBC
HCT: 31.2 % — ABNORMAL LOW (ref 36.0–46.0)
Hemoglobin: 10 g/dL — ABNORMAL LOW (ref 12.0–15.0)
MCH: 27.8 pg (ref 26.0–34.0)
MCHC: 32.1 g/dL (ref 30.0–36.0)
MCV: 86.7 fL (ref 78.0–100.0)
RBC: 3.6 MIL/uL — ABNORMAL LOW (ref 3.87–5.11)

## 2011-03-15 LAB — URINE CULTURE: Culture  Setup Time: 201205270212

## 2011-03-17 LAB — BASIC METABOLIC PANEL
BUN: 11 mg/dL (ref 6–23)
CO2: 28 mEq/L (ref 19–32)
Calcium: 9.2 mg/dL (ref 8.4–10.5)
Creatinine, Ser: 0.66 mg/dL (ref 0.4–1.2)
GFR calc non Af Amer: >60 mL/min (ref >60)
Glucose, Bld: 109 mg/dL — ABNORMAL HIGH (ref 70–99)

## 2011-03-17 LAB — CBC
MCH: 28 pg (ref 26.0–34.0)
MCHC: 32.9 g/dL (ref 30.0–36.0)
RDW: 15.4 % (ref 11.5–15.5)

## 2011-03-19 LAB — CULTURE, BLOOD (ROUTINE X 2): Culture: NO GROWTH

## 2011-03-20 LAB — BASIC METABOLIC PANEL
BUN: 13 mg/dL (ref 6–23)
Chloride: 100 mEq/L (ref 96–112)
Creatinine, Ser: 0.7 mg/dL (ref 0.4–1.2)
GFR calc Af Amer: >60 mL/min (ref >60)
GFR calc non Af Amer: >60 mL/min (ref >60)
Potassium: 4 mEq/L (ref 3.5–5.1)

## 2011-03-20 LAB — CBC
MCH: 28.1 pg (ref 26.0–34.0)
MCV: 86.7 fL (ref 78.0–100.0)
Platelets: 297 10*3/uL (ref 150–400)
RBC: 3.77 MIL/uL — ABNORMAL LOW (ref 3.87–5.11)
RDW: 15.4 % (ref 11.5–15.5)
WBC: 8.5 10*3/uL (ref 4.0–10.5)

## 2011-03-21 ENCOUNTER — Inpatient Hospital Stay (HOSPITAL_COMMUNITY): Payer: Medicare Other

## 2011-03-21 DIAGNOSIS — R197 Diarrhea, unspecified: Secondary | ICD-10-CM

## 2011-03-22 DIAGNOSIS — R197 Diarrhea, unspecified: Secondary | ICD-10-CM

## 2011-03-22 LAB — BASIC METABOLIC PANEL
CO2: 39 mEq/L — ABNORMAL HIGH (ref 19–32)
Calcium: 9.4 mg/dL (ref 8.4–10.5)
Chloride: 97 mEq/L (ref 96–112)
Creatinine, Ser: 0.8 mg/dL (ref 0.4–1.2)
GFR calc Af Amer: >60 mL/min (ref >60)
GFR calc non Af Amer: >60 mL/min (ref >60)
Sodium: 139 mEq/L (ref 135–145)

## 2011-04-04 NOTE — Discharge Summary (Signed)
[name] [name], [name] [name] NO.:  [id number]  MEDICAL RECORD NO.:  [id number]  LOCATION:  6715                         FACILITY:  [facility]  PHYSICIAN:  Edsel Petrin, D.O.DATE OF BIRTH:  09-10-1924  DATE OF ADMISSION:  03/12/2011 DATE OF DISCHARGE:  03/22/2011                              DISCHARGE SUMMARY   DISCHARGE DIAGNOSES: 1. Recurrent/persistent Clostridium difficile colitis. 2. Atrial fibrillation with RVR. 3. Urinary tract infection  with greater than 100,000 colonies of     Escherichia coli. 4. Left pleural effusion.  DISCHARGE MEDICATIONS: 1. Lasix 60 mg p.o. daily. 2. Hydrochlorothiazide 12.5 mg p.o. daily. 3. Rifaximin 400 mg p.o. b.i.d x14 days 4. Rivaroxaban 15 mg p.o. daily. 5. Vancomycin p.o. take 125 mg q.6 h x14 days, then 125 mg q.12 h x7     days, then 125 mg q.24 h x7 days, then 125 mg q.48 h x8 weeks. 6. Celexa 20 mg p.o. daily. 7. Docusate 100 mg p.o. b.i.d. p.r.n. for constipation. 8. Diltiazem 240 mg p.o. daily. 9. Ferrous sulfate 325 mg p.o. q. daily. 10.Levothyroxine 100 mcg p.o. q.a.m. 11.Multivitamin p.o. daily. 12.Polyethylene glycol 17 mg p.o. daily as needed for constipation. 13.Remeron 50 mg p.o. daily. 14.Florastor 2 tablets by mouth b.i.d. 15.Tylenol 325 mg 2 tablets p.o. q.6 h. p.r.n. for pain. 16.Zofran 4 mg 1 tablet by mouth q.6 h. p.r.n. for nausea.  DISPOSITION AND FOLLOWUP:  [name] [name] was discharged from [facility] on March 22, 2011 in stable and improved condition.  She has no shortness of breath, nausea, vomiting, fever, or watery stool.  She needs to continue to take her vancomycin tapering dose 12 weeks given her recurrent/persistent C.  diff colitis.  She will need to follow up with her PCP at the nursing home.  At that time, will need to recheck her BMET to make sure that her creatinine is stable since the patient was placed on Lasix 60 mg p.o. daily for her questionable CHF versus pleural  effusion.  CONSULTATIONS:  None.  PROCEDURE PERFORMED: 1. Chest x-ray two views on Mar 13, 2011, shows a stable left     hydropneumothorax. 2. CT scan of abdomen and pelvis on Mar 13, 2011, shows diffuse     inflammation involving the descending and sigmoid colon, extending     into the rectum, with prominent wall thickening, compatible with     colitis.  Given the patient's history of C. difficile, findings are     suspicious for infection.  Trace fluid noted within the pelvis.     Wall thickening involving the cecum, recent question for mild     typhlitis.  Malignancy could have similar appearance, suggest     clinical correlation.  Large left pleural effusion noted with     associated consolidation of left lower lobe.  Diffuse calcification     along the abdominal aorta and its branches.  Diffuse diverticulosis     along the descending and sigmoid colon.  Multifocal scarring with     respect to both kidneys.  Mild cardiomegaly noted.  Degenerative     change noted along the lumbar spine. 3. Two views chest x-ray on March 21, 2011,  shows no substantial change     in the moderate-to-large left pleural effusion with interval     development of small right pleural effusion.  Bibasilar collapse     last consolidation.  ADMISSION HISTORY:  [name] [name] is an 77 year old woman with past medical history of renal insufficiency, hypertension, AFib, anxiety, depression, hypothyroidism, inguinal hernia, osteoarthritis, recurrent UTI, recurrent C. diff colitis, CVA, presents to the ED with pain concern of acute onset of loose dark stool that started 2 days prior to admission with associated fever of 101.  She has just recently completed treatment with vancomycin and Flagyl and rifaximin for C. diff colitis (about 4 weeks total). She has been having mild epigastric pain and vomited several times, which was nonbloody.  She denies any specific aggravating or alleviating factors.  She denies any  urinary concerns, no chest pain, short of breath, or no other systemic concerns.  ADMITTING PHYSICAL EXAMINATION:  VITAL SIGNS:  Temperature 99.7, blood pressure 99/67, respirations 21, pulse 116, O2 sat 99% on 2 L of nasal cannula. GENERAL:  No acute distress, cooperative with examination, alert and oriented x3. HEENT:  Normocephalic and atraumatic, tympanic membranes normal bilaterally, mouth no erythema or exudates.  Moist mucous membranes. Pupils equal, round, and reactive to light and accommodation. Extraocular movement intact, conjunctiva normal, no scleral icterus. CARDIOVASCULAR:  Irregularly irregular, S1 and S2, no murmur, gallop, or rubs, pulses symmetric and intact bilaterally. PULMONARY:  Clear to auscultation bilaterally.  No wheezes, rales, or rhonchi.  Decreased breath sounds at left lung base. ABDOMINAL:  Soft, mild left lower quadrant tenderness, and mild distention, bowel sounds are normal.  No masses, no organomegaly or guarding present. GU:  No CVA tenderness. MUSCULOSKELETAL:  No joint deformity, no erythema or stiffness.  Full range of motion and no tenderness. NEUROLOGICAL:  Alert and oriented x3.  Strength is normal and symmetric bilaterally.  Cranial nerves II through XII were grossly intact, no focal motor deficit, intact sensation to light touch bilaterally. SKIN:  Warm and dry, intact. EXTREMITIES:  Shows hyperpigmentation bilaterally on lower extremity. PSYCHIATRIC:  Normal mood and affect, speech and behavior is normal.  ADMISSION LABS: 1. WBC 18.8, hemoglobin 11.4, hematocrit 35.1, MCV 86.9, platelets     300. 2. Sodium 137, potassium 3.5, chloride 101, bicarb 27, glucose 109,     BUN 39, creatinine 0.99, T-bili 0.3, alk phos 60, AST 16, ALT 11,     total protein 6.2, albumin 2.8, calcium 9.3, lipase 25.  Lactic     acid 1.1.  Urinalysis shows large leukocytes. 3. Urine culture shows greater than 100,000 colonies of E. coli on Mar 12, 2011 with  pan-sensitivity. 4. Blood culture x2 on Mar 12, 2011, shows no growth to date.  C. diff     by PCR was positive on Mar 12, 2011.  HOSPITAL COURSE: 1. C. diff colitis, which was positive by PCR on Mar 12, 2011, which     is confirmed with CT of abdomen and pelvis.  The patient was     started on Flagyl, p.o. vancomycin, and rifaximin during this     hospital course.  She completed a total of 8 days of Flagyl, 7 days     of rifaximin, and 10 days of vancomycin.  The patient's diarrhea     improved over the hospital course and her diarrhea began to become     more formed.  The patient had about 1 or 2 episode of diarrhea  during the last few days of hospitalization.  The patient will be     sent home with 12 weeks of vancomycin tapering dose along with     rifaximin x2 weeks.  I spoke to Dr. Ninetta Lights in regards to her long tapering course Abx in which he agreed with     the plan.  This patient needs a longer tapering dose of vancomycin     given her recurrent/persistent C. difficile.  During last     admission, the patient was discharged home and she only completed     about 4 weeks with vancomycin tapering dose, as soon as the patient     stopped the vancomycin at the end of fourth week, the patient began     to have symptoms of diarrhea and had to be readmitted to the     hospital for C. diff colitis. 2. History of AFib with RVR.  At first, the patient was started on     half of her home dose of diltiazem, which was one the home dose of     diltiazem of 240 mg and the patient was monitored on telemetry.     She was subsequently resumed her home dose of 240 mg p.o. daily in     which her heart rate stabilized, she was rate controlled with a     heart rate in the 80s to 90s.  The patient's rivaroxaban was held     in the beginning of the hospital course in the setting of C. diff     colitis.  However, once her diarrhea symptoms improved, we     restarted her rivaroxaban on March 18, 2011  with no other     complications. 3. UTI.  The patient was treated with UTI.  The patient was treated     with Rocephin empirically.  Her urinalysis showed large leukocytes     and urine culture on Mar 12, 2011, shows greater than 100,000     colonies of E. coli.  The patient started to complaining of     intermittent urinary symptoms such as burning sensation and     dysuria.  The patient was then started on Macrobid of a 3-day     course in which she is sensitive to Macrobid on urine culture     sensitivities.  The patient's symptoms resolved. 4. Left pleural effusion, which was previously drained with     transudative result.  The patient was asymptomatic during the early     course of hospitalization; however, she became symptomatic and     shortness of breath on March 21, 2011, therefore we obtained a chest     x-ray in which shows no substantial change in moderate-to-large     left pleural effusion with interval development of small right     pleural effusion.  The patient was given Lasix 80 mg IV x1 dose and     then subsequently Lasix 60 mg p.o. daily.  Her shortness of breath     improved overnight and she was completely asymptomatic on day of     discharge. 5. Deconditioning.  We continue PT/OT during this hospital course. 6. Hypertension.  The patient's blood pressure was elevated up to systolic 171 over diastolic of 93.  The patient was started on     hydrochlorothiazide 12.5 mg p.o. daily, which she tolerated well. 7. DVT prophylaxis.  The patient was on rivaroxaban 50 mg p.o. daily.  DISCHARGE VITALS:  Temperature 97.4, pulse 90, blood pressure 143/87, respiration 18, O2 sat 95% on 2 L of nasal cannula.  DISCHARGE LABS:  Sodium 139, potassium 3.9, chloride 97, bicarb 39, glucose 99, BUN 19, creatinine 0.80, calcium 9.4, blood cultures x2 on Mar 12, 2011, negative.    ______________________________ Carrolyn Meiers, MD   ______________________________ Edsel Petrin,  D.O.    MH/MEDQ  D:  03/22/2011  T:  03/22/2011  Job:  [id number]  Electronically Signed by Carrolyn Meiers MD on 03/30/2011 12:19:21 PM Electronically Signed by Anderson Malta D.O. on 04/04/2011 03:35:55 PM

## 2011-05-09 ENCOUNTER — Telehealth: Payer: Self-pay | Admitting: *Deleted

## 2011-05-09 NOTE — Telephone Encounter (Signed)
Call from pt's daughter asking for name of GYD doctor pt was referred to last year. Pt having vaginal bleeding at this time. Dr Su Hilt name given to daughter and asked to call back if she has any problem getting appointment.

## 2011-06-14 ENCOUNTER — Emergency Department (HOSPITAL_COMMUNITY)
Admission: EM | Admit: 2011-06-14 | Discharge: 2011-06-14 | Disposition: A | Discharge status: Home / Self Care | Payer: Medicare Other | Attending: Emergency Medicine | Admitting: Emergency Medicine

## 2011-06-14 ENCOUNTER — Emergency Department (HOSPITAL_COMMUNITY): Payer: Medicare Other

## 2011-06-14 DIAGNOSIS — Z8673 Personal history of transient ischemic attack (TIA), and cerebral infarction without residual deficits: Secondary | ICD-10-CM | POA: Insufficient documentation

## 2011-06-14 DIAGNOSIS — K219 Gastro-esophageal reflux disease without esophagitis: Secondary | ICD-10-CM | POA: Insufficient documentation

## 2011-06-14 DIAGNOSIS — E039 Hypothyroidism, unspecified: Secondary | ICD-10-CM | POA: Insufficient documentation

## 2011-06-14 DIAGNOSIS — F3289 Other specified depressive episodes: Secondary | ICD-10-CM | POA: Insufficient documentation

## 2011-06-14 DIAGNOSIS — M1990 Unspecified osteoarthritis, unspecified site: Secondary | ICD-10-CM | POA: Insufficient documentation

## 2011-06-14 DIAGNOSIS — I4891 Unspecified atrial fibrillation: Secondary | ICD-10-CM | POA: Insufficient documentation

## 2011-06-14 DIAGNOSIS — F329 Major depressive disorder, single episode, unspecified: Secondary | ICD-10-CM | POA: Insufficient documentation

## 2011-06-14 DIAGNOSIS — N898 Other specified noninflammatory disorders of vagina: Secondary | ICD-10-CM | POA: Insufficient documentation

## 2011-06-14 DIAGNOSIS — R0989 Other specified symptoms and signs involving the circulatory and respiratory systems: Secondary | ICD-10-CM | POA: Insufficient documentation

## 2011-06-14 DIAGNOSIS — E785 Hyperlipidemia, unspecified: Secondary | ICD-10-CM | POA: Insufficient documentation

## 2011-06-14 DIAGNOSIS — Z79899 Other long term (current) drug therapy: Secondary | ICD-10-CM | POA: Insufficient documentation

## 2011-06-14 DIAGNOSIS — I10 Essential (primary) hypertension: Secondary | ICD-10-CM | POA: Insufficient documentation

## 2011-06-14 DIAGNOSIS — R4182 Altered mental status, unspecified: Secondary | ICD-10-CM | POA: Insufficient documentation

## 2011-06-14 DIAGNOSIS — R000 Tachycardia, unspecified: Secondary | ICD-10-CM | POA: Insufficient documentation

## 2011-06-14 DIAGNOSIS — R0602 Shortness of breath: Secondary | ICD-10-CM | POA: Insufficient documentation

## 2011-06-14 DIAGNOSIS — R0609 Other forms of dyspnea: Secondary | ICD-10-CM | POA: Insufficient documentation

## 2011-06-14 LAB — POCT I-STAT TROPONIN I: Troponin i, poc: 0 ng/mL (ref 0.00–0.08)

## 2011-06-14 LAB — BASIC METABOLIC PANEL
CO2: 30 mEq/L (ref 19–32)
Calcium: 9.4 mg/dL (ref 8.4–10.5)
Creatinine, Ser: 0.81 mg/dL (ref 0.50–1.10)
GFR calc Af Amer: >60 mL/min (ref >60)
GFR calc non Af Amer: >60 mL/min (ref >60)
Sodium: 140 mEq/L (ref 135–145)

## 2011-06-14 LAB — CBC
Platelets: 345 10*3/uL (ref 150–400)
RBC: 3.87 MIL/uL (ref 3.87–5.11)
RDW: 15.5 % (ref 11.5–15.5)
WBC: 9.8 10*3/uL (ref 4.0–10.5)

## 2011-06-14 LAB — DIFFERENTIAL
Basophils Absolute: 0 10*3/uL (ref 0.0–0.1)
Basophils Relative: 0 % (ref 0–1)
Eosinophils Absolute: 0.2 10*3/uL (ref 0.0–0.7)
Eosinophils Relative: 2 % (ref 0–5)
Lymphs Abs: 0.6 10*3/uL — ABNORMAL LOW (ref 0.7–4.0)
Neutrophils Relative %: 81 % — ABNORMAL HIGH (ref 43–77)

## 2011-06-14 LAB — PROTIME-INR
INR: 1.18 (ref 0.00–1.49)
Prothrombin Time: 15.2 seconds (ref 11.6–15.2)

## 2011-06-14 LAB — PRO B NATRIURETIC PEPTIDE: Pro B Natriuretic peptide (BNP): 2451 pg/mL — ABNORMAL HIGH (ref 0–450)

## 2011-06-14 LAB — APTT: aPTT: 33 seconds (ref 24–37)

## 2011-06-22 ENCOUNTER — Encounter: Payer: Self-pay | Admitting: Internal Medicine

## 2011-06-22 ENCOUNTER — Emergency Department (HOSPITAL_COMMUNITY): Payer: Medicare Other

## 2011-06-22 ENCOUNTER — Inpatient Hospital Stay (HOSPITAL_COMMUNITY)
Admission: EM | Admit: 2011-06-22 | Discharge: 2011-06-27 | DRG: 291 | Disposition: A | Discharge status: Skilled Nursing Facility | Payer: Medicare Other | Source: Ambulatory Visit | Attending: Internal Medicine | Admitting: Internal Medicine

## 2011-06-22 DIAGNOSIS — I10 Essential (primary) hypertension: Secondary | ICD-10-CM | POA: Diagnosis present

## 2011-06-22 DIAGNOSIS — E876 Hypokalemia: Secondary | ICD-10-CM | POA: Diagnosis present

## 2011-06-22 DIAGNOSIS — M1990 Unspecified osteoarthritis, unspecified site: Secondary | ICD-10-CM | POA: Diagnosis present

## 2011-06-22 DIAGNOSIS — Z66 Do not resuscitate: Secondary | ICD-10-CM | POA: Diagnosis present

## 2011-06-22 DIAGNOSIS — E46 Unspecified protein-calorie malnutrition: Secondary | ICD-10-CM | POA: Diagnosis present

## 2011-06-22 DIAGNOSIS — I4891 Unspecified atrial fibrillation: Secondary | ICD-10-CM | POA: Diagnosis present

## 2011-06-22 DIAGNOSIS — I5033 Acute on chronic diastolic (congestive) heart failure: Principal | ICD-10-CM | POA: Diagnosis present

## 2011-06-22 DIAGNOSIS — J9600 Acute respiratory failure, unspecified whether with hypoxia or hypercapnia: Secondary | ICD-10-CM | POA: Diagnosis present

## 2011-06-22 DIAGNOSIS — Z880 Allergy status to penicillin: Secondary | ICD-10-CM

## 2011-06-22 DIAGNOSIS — F341 Dysthymic disorder: Secondary | ICD-10-CM | POA: Diagnosis present

## 2011-06-22 DIAGNOSIS — Z79899 Other long term (current) drug therapy: Secondary | ICD-10-CM

## 2011-06-22 DIAGNOSIS — E039 Hypothyroidism, unspecified: Secondary | ICD-10-CM | POA: Diagnosis present

## 2011-06-22 DIAGNOSIS — Z8673 Personal history of transient ischemic attack (TIA), and cerebral infarction without residual deficits: Secondary | ICD-10-CM

## 2011-06-22 DIAGNOSIS — K219 Gastro-esophageal reflux disease without esophagitis: Secondary | ICD-10-CM | POA: Diagnosis present

## 2011-06-22 DIAGNOSIS — J90 Pleural effusion, not elsewhere classified: Secondary | ICD-10-CM | POA: Diagnosis present

## 2011-06-22 DIAGNOSIS — D649 Anemia, unspecified: Secondary | ICD-10-CM | POA: Diagnosis present

## 2011-06-22 DIAGNOSIS — R627 Adult failure to thrive: Secondary | ICD-10-CM | POA: Diagnosis present

## 2011-06-22 DIAGNOSIS — I509 Heart failure, unspecified: Secondary | ICD-10-CM | POA: Diagnosis present

## 2011-06-22 LAB — BASIC METABOLIC PANEL
BUN: 20 mg/dL (ref 6–23)
Calcium: 9.5 mg/dL (ref 8.4–10.5)
Calcium: 9.1 mg/dL (ref 8.4–10.5)
Creatinine, Ser: 0.8 mg/dL (ref 0.50–1.10)
GFR calc Af Amer: >60 mL/min (ref >60)
GFR calc non Af Amer: >60 mL/min (ref >60)
GFR calc non Af Amer: >60 mL/min (ref >60)
Glucose, Bld: 149 mg/dL — ABNORMAL HIGH (ref 70–99)
Sodium: 143 mEq/L (ref 135–145)
Sodium: 142 mEq/L (ref 135–145)

## 2011-06-22 LAB — HEPATIC FUNCTION PANEL
ALT: 21 U/L (ref 0–35)
Alkaline Phosphatase: 57 U/L (ref 39–117)
Bilirubin, Direct: <0.1 mg/dL (ref 0.0–0.3)
Total Bilirubin: 0.3 mg/dL (ref 0.3–1.2)

## 2011-06-22 LAB — URINALYSIS, ROUTINE W REFLEX MICROSCOPIC
Bilirubin Urine: NEGATIVE
Hgb urine dipstick: NEGATIVE
Nitrite: NEGATIVE
Protein, ur: NEGATIVE mg/dL
Urobilinogen, UA: 0.2 mg/dL (ref 0.0–1.0)

## 2011-06-22 LAB — DIFFERENTIAL
Basophils Relative: 0 % (ref 0–1)
Eosinophils Absolute: 0 10*3/uL (ref 0.0–0.7)
Eosinophils Relative: 0 % (ref 0–5)
Lymphs Abs: 0.3 10*3/uL — ABNORMAL LOW (ref 0.7–4.0)
Neutrophils Relative %: 90 % — ABNORMAL HIGH (ref 43–77)

## 2011-06-22 LAB — GLUCOSE, CAPILLARY: Glucose-Capillary: 174 mg/dL — ABNORMAL HIGH (ref 70–99)

## 2011-06-22 LAB — CBC
MCV: 83.8 fL (ref 78.0–100.0)
Platelets: 481 10*3/uL — ABNORMAL HIGH (ref 150–400)
RDW: 16 % — ABNORMAL HIGH (ref 11.5–15.5)
WBC: 11.8 10*3/uL — ABNORMAL HIGH (ref 4.0–10.5)

## 2011-06-22 LAB — POCT I-STAT 3, ART BLOOD GAS (G3+)
Acid-Base Excess: 8 mmol/L — ABNORMAL HIGH (ref 0.0–2.0)
Bicarbonate: 33.2 mEq/L — ABNORMAL HIGH (ref 20.0–24.0)
Patient temperature: 98.6
TCO2: 35 mmol/L (ref 0–100)

## 2011-06-22 LAB — POCT I-STAT TROPONIN I: Troponin i, poc: 0 ng/mL (ref 0.00–0.08)

## 2011-06-22 LAB — PRO B NATRIURETIC PEPTIDE: Pro B Natriuretic peptide (BNP): 6633 pg/mL — ABNORMAL HIGH (ref 0–450)

## 2011-06-22 NOTE — H&P (Signed)
Hospital Admission Note Date: 06/22/2011  Patient name:  [name] [name]   Medical record number:  [id number] Date of birth:  1923/11/29  Age: 77 y.o. Gender:  female PCP:    Anderson Malta, DO  Medical Service:   Internal Medicine Teaching Service   Attending physician:  Dr. Rogelia Boga First Contact:   Louanne Skye- IV Pager: [phone] Second Contact:   Dr. Carrolyn Meiers Pager: [phone] After Hours:    First Contact   Pager: [phone]      Second Contact  Pager: [phone]   Chief Complaint: SOB  History of Present Illness:  Patient is an 77 year-old female from [facility] with a complicated PMH, including atrial fibrillation, CHF, HTN, PNA, UTI, bilateral transudative pleural effusions and multiple hospitalizations this year for mainly C. difficile colitis, who presents with 1 week history of worsening shortness of breath. History was provided mainly by patient's son since patient was in mild to moderate respiratory distress; therefore the history is limited. Patient's son states she has baseline shortness of breath, but went into atrial fibrillation last Tuesday, which resulted in gradual worsening of respiratory distress and O2 requirement.  Today, patient's family contacted pt's PCP Dr. Phillips Odor, who recommended taking her to the ED.  EMS was called and treated patient with CPAP, 125 mg solumedrol and 5mg  albuterol nebulizer. Pt denies any associated chest pain, abdominal pain, dizziness, or lightheadedness. Of note, pt had recent diagnosis of PNA on August 28, treated with Avelox since then. Pt also had therapeutic thoracentesis of right and left lungs in April, yielding 680 mL and of fluid, respectively.   Current Outpatient Medications: Current Outpatient Prescriptions  Medication Sig Dispense Refill  . carbamide peroxide (DEBROX) 6.5 % otic solution Place 5 drops into the right ear 2 (two) times daily.  15 mL  2  . citalopram (CELEXA) 20 MG tablet Take 20 mg by mouth daily.          Marland Kitchen diltiazem (DILACOR XR) 120 MG 24 hr capsule Take 120 mg by mouth daily.        Marland Kitchen docusate sodium (COLACE) 100 MG capsule Take 100 mg by mouth 2 (two) times daily.        Marland Kitchen ENSURE (ENSURE) Take 237 mLs by mouth 3 (three) times daily between meals.        . ferrous sulfate 325 (65 FE) MG tablet Take 325 mg by mouth daily with breakfast.        . Incontinence Supplies MISC Use as directed.  Please provide 100 Briefs and 100 chux pads. Dx: Urinary Incontinence, Mixed; Bladder Spam 788.30   100 each  prn  . levothyroxine (SYNTHROID, LEVOTHROID) 100 MCG tablet Take 100 mcg by mouth daily.        . Lidocaine-Hydrocortisone Ace 3-0.5 % CREA Apply topically. 4 times daily as needed       . metroNIDAZOLE (FLAGYL) 500 MG tablet Take 500 mg by mouth 3 (three) times daily.        . Multiple Vitamins-Minerals (MULTIVITAMIN WITH MINERALS) tablet Take 1 tablet by mouth daily.        . ondansetron (ZOFRAN) 4 MG tablet Take 4 mg by mouth every 8 (eight) hours as needed.        . saccharomyces boulardii (FLORASTOR) 250 MG capsule Take 250 mg by mouth 2 (two) times daily.        . vancomycin (VANCOCIN) 50 mg/mL oral solution Take by mouth as directed. 125 mg PO as directed  completed on 05/31/1999       Allergies: Penicillin  Past Medical History: Past Medical History  Diagnosis Date   Anemia of chronic disease   . Hypertension   . A-fib on Rivaroxaban for anticoagulation b/c she has hx of GI bleed   . Anxiety   . Depression   . Hypothyroidism   . Inguinal hernia     repaired 2011  . Osteoarthritis   . Cerebral vascular accident   . DJD (degenerative joint disease)   . Recurrent UTI   -multiple admission for C. difficile which require multiple rounds of vancomycin, she finally completed her treatment on 05/31/2011  Past Surgical History: Past Surgical History  Procedure Date  . Inguinal hernia repair   . Bladder surgery     prolapsed bladder    SOCIAL HISTORY:  Divorced. Lives at  [facility]. 2 healthy children. Daughter in [place] visits often. Former snuff user, quit in 1962. No alcohol, no drugs.   FAMILY HISTORY  Family History: Family History of CAD Female 1st degree relative <60  Family History Hypertension  Family History of Stroke F 1st degree relative <60   Review of Systems: Pertinent items are noted in HPI.  Vital Signs: T:  P: 105-126 BP: 141/83 RR:  O2 sat: 91 RA then 97% on BiPap    Physical Exam: General: Vital signs reviewed and noted. Mild to moderate respiratory distress on BiPAP.  Patient can answer questions appropriately and follows commands.  Head: Normocephalic, atraumatic.  Eyes: PERRL, EOMI, No signs of anemia or jaundince.  Nose: Mucous membranes moist, not inflammed, nonerythematous.  Throat: Oropharynx nonerythematous, no exudate appreciated.   Neck: No deformities, masses, or tenderness noted. Supple, No carotid Bruits, no JVD.  Lungs:   Crackles on right: Upper to mid lung, mild scattered expiratory wheezes.decreased breath sounds bilaterally at bases. Mild accessory muscle use   Heart:  irregularly irregular, tachycardic. S1 and S2 normal without gallop, murmur, or rubs.  Abdomen:  BS hypoactive. Soft, slightly distended, non-tender.   Extremities: No pretibial edema.  Neurologic: A&O X3, CN II - XII are grossly intact. Motor strength is 5/5 in the all 4 extremities, Sensations intact to light touch, Cerebellar signs negative.  Skin: No visible rashes, scars.   Lab results:   WBC                                      11.8       h      4.0-10.5         K/uL  RBC                                      3.94              3.87-5.11        MIL/uL  Hemoglobin (HGB)                         10.3       l      12.0-15.0        g/dL  Hematocrit (HCT)                         33.0       l  36.0-46.0        %  MCV                                      83.8              78.0-100.0       fL  MCH -                                    26.1               26.0-34.0        pg  MCHC                                     31.2              30.0-36.0        g/dL  RDW                                      16.0       h      11.5-15.5        %  Platelet Count (PLT)                     481        h      150-400          K/uL  Neutrophils, %                           90         h      43-77            %  Lymphocytes, %                           3          l      12-46            %  Monocytes, %                             7                 3-12             %  Eosinophils, %                           0                 0-5              %  Basophils, %                             0                 0-1              %  Neutrophils, Absolute                    10.6       h      1.7-7.7          K/uL  Lymphocytes, Absolute                    0.3        l      0.7-4.0          K/uL  Monocytes, Absolute                      0.8               0.1-1.0          K/uL  Eosinophils, Absolute                    0.0               0.0-0.7          K/uL  Basophils, Absolute                      0.0               0.0-0.1          K/uL Sodium (NA)                              143               135-145          mEq/L  Potassium (K)                            3.6               3.5-5.1          mEq/L  Chloride                                 100               96-112           mEq/L  CO2                                      33         h      19-32            mEq/L  Glucose                                  149        h      70-99            mg/dL  BUN                                      20                6-23  mg/dL  Creatinine                               0.79              0.50-1.10        mg/dL  GFR, Est Non African American            >60               >60              mL/min  GFR, Est African American                >60               >60              mL/min    Oversized comment, see footnote  1  Calcium                                  9.5                8.4-10.5         Mg/dL   Troponin I, POC                          0.00              0.00-0.08        ng/mL    Imaging results:   06/22/11 CXR: Probable worsening of left greater than right pleural effusions and   bibasilar atelectasis with suspected mild edema.   Assessment & Plan:  1. Shortness of breath  Likely multifactorial. Differential diagnosis includes CHF exacerbation versus pneumonia versus pleural effusion.  Chest x-ray today shows pulmonary edema along with bilateral pleural effusion left greater than right. Patient does have a history of left pleural effusion which was found to be transudative in the past admission.  Patient also had a chest x-ray at the nursing home on 06/21/2011 which shows right lower lung infiltrate as well as infiltrate ,and/or effusion of left mid and lower lung.  Patient also had left and right therapeutic thoracentesis performed during April 2012 admission. -We'll admit patient to step down unit -Continue BiPAP per respiratory therapist -Stat ABG -Give Lasix 120 mg IV x1 dose that, then Lasix 80 mg IV twice a day -Albuterol 2.5 mg nebulizer Q2 to 4 hours when necessary shortness of breath -Recheck proBNP,( last proBNP was on 06/14/2011 will level of 2451) -Strict I&Os, fluid restrict to 1020ml/day, daily weight -Will check for Urine Legionella -Get blood cultures x 2 , U/A & urinalysis -Repeat chest x-ray in a.m. -Will start vancomycin IV and cefepime IV per pharmacy to empirically treat for HCAP since Ms. Oregel is a residence in a nursing home who failed oral abx: Avelox which was started on 06/14/11.  2.  Atrial fibrillation - Start on Diltiazem 10mg  bolus and 10mg /hour, titrated as needed, since patient cannot take oral diltiazem secondary to respiratory distress. We will check her TSH level.  Will continue Xarelto for anticoagulation once pt can tolerate oral meds as well.   3. Hypertension - Will continue to monitor patient's blood pressure for  improvement with diuresis of fluid.  If hypertension not controlled with diltiazem and diuresis, will consider modifying her hypertension  therapy.   4. Hypothyroidism - Will hold patient's levothyroxine at this time until patient can tolerate oral meds.    5. Depression and anxiety - Will hold oral meds while patient in respiratory distress and on BiPAP. Will treat with IV Ativan prn.   - Once respiratory distress is controlled, will resume home oral medications, Celexa and Remeron.   6. Iron Deficiency anemia-  Baseline Hb 10-11.  Anemia panel on February 2012 showed an iron of 34, TIBC 263, percent saturation 13, TIBC 229, vitamin B12 506, folate 9, ferritin 51 which is consistent with iron deficiency anemia. Upon discharge in June of 2012, patient was prescribed ferrous sulfate however it was discontinue at the nursing home most likely secondary to constipation.  Will continue to monitor her CBC.   DVT PPX - Xarelto 15mg  po qd for chronic anticoagulation     Carrolyn Meiers, M.D. (PGY2):  ____________________________________    Date/ Time:    ____________________________________     Orlene Plum. (MS-IV):   ____________________________________    Date/ Time:    ____________________________________     I have seen and examined the patient. I reviewed the resident/fellow note and agree with the findings and plan of care as documented. My additions and revisions are included.   Signature:  ____________________________________________     Internal Medicine Teaching Service Attending    Date:    ____________________________________________

## 2011-06-23 ENCOUNTER — Other Ambulatory Visit: Payer: Self-pay | Admitting: Internal Medicine

## 2011-06-23 ENCOUNTER — Inpatient Hospital Stay (HOSPITAL_COMMUNITY): Payer: Medicare Other

## 2011-06-23 DIAGNOSIS — J9600 Acute respiratory failure, unspecified whether with hypoxia or hypercapnia: Secondary | ICD-10-CM

## 2011-06-23 DIAGNOSIS — J189 Pneumonia, unspecified organism: Secondary | ICD-10-CM

## 2011-06-23 DIAGNOSIS — I4891 Unspecified atrial fibrillation: Secondary | ICD-10-CM

## 2011-06-23 DIAGNOSIS — I509 Heart failure, unspecified: Secondary | ICD-10-CM

## 2011-06-23 DIAGNOSIS — J90 Pleural effusion, not elsewhere classified: Secondary | ICD-10-CM

## 2011-06-23 LAB — BASIC METABOLIC PANEL
BUN: 21 mg/dL (ref 6–23)
BUN: 28 mg/dL — ABNORMAL HIGH (ref 6–23)
CO2: 35 mEq/L — ABNORMAL HIGH (ref 19–32)
Calcium: 9.4 mg/dL (ref 8.4–10.5)
Chloride: 99 mEq/L (ref 96–112)
Chloride: 98 mEq/L (ref 96–112)
Creatinine, Ser: 0.87 mg/dL (ref 0.50–1.10)
GFR calc Af Amer: >60 mL/min (ref >60)
GFR calc non Af Amer: 53 mL/min — ABNORMAL LOW (ref >60)
Glucose, Bld: 113 mg/dL — ABNORMAL HIGH (ref 70–99)
Potassium: 3.9 mEq/L (ref 3.5–5.1)
Sodium: 139 mEq/L (ref 135–145)

## 2011-06-23 LAB — CBC
HCT: 33.9 % — ABNORMAL LOW (ref 36.0–46.0)
MCH: 25.7 pg — ABNORMAL LOW (ref 26.0–34.0)
MCV: 83.1 fL (ref 78.0–100.0)
Platelets: 497 10*3/uL — ABNORMAL HIGH (ref 150–400)
RDW: 16.3 % — ABNORMAL HIGH (ref 11.5–15.5)
WBC: 14 10*3/uL — ABNORMAL HIGH (ref 4.0–10.5)

## 2011-06-23 LAB — TSH: TSH: 5.236 u[IU]/mL — ABNORMAL HIGH (ref 0.350–4.500)

## 2011-06-23 LAB — AMYLASE, BODY FLUID: Amylase, Fluid: 14 U/L

## 2011-06-23 LAB — LEGIONELLA ANTIGEN, URINE: Legionella Antigen, Urine: NEGATIVE

## 2011-06-23 LAB — GLUCOSE, CAPILLARY
Glucose-Capillary: 137 mg/dL — ABNORMAL HIGH (ref 70–99)
Glucose-Capillary: 144 mg/dL — ABNORMAL HIGH (ref 70–99)

## 2011-06-23 LAB — URINE CULTURE
Colony Count: NO GROWTH
Culture: NO GROWTH

## 2011-06-23 LAB — BODY FLUID CELL COUNT WITH DIFFERENTIAL

## 2011-06-23 LAB — APTT: aPTT: 35 seconds (ref 24–37)

## 2011-06-24 ENCOUNTER — Inpatient Hospital Stay (HOSPITAL_COMMUNITY): Payer: Medicare Other

## 2011-06-24 LAB — MAGNESIUM: Magnesium: 2 mg/dL (ref 1.5–2.5)

## 2011-06-24 LAB — BASIC METABOLIC PANEL
BUN: 28 mg/dL — ABNORMAL HIGH (ref 6–23)
CO2: 38 mEq/L — ABNORMAL HIGH (ref 19–32)
Calcium: 9.3 mg/dL (ref 8.4–10.5)
Chloride: 99 mEq/L (ref 96–112)
Creatinine, Ser: 1 mg/dL (ref 0.50–1.10)
GFR calc Af Amer: >60 mL/min (ref >60)
GFR calc non Af Amer: 53 mL/min — ABNORMAL LOW (ref >60)

## 2011-06-24 LAB — CBC
HCT: 31.5 % — ABNORMAL LOW (ref 36.0–46.0)
MCHC: 32.1 g/dL (ref 30.0–36.0)
MCV: 81.6 fL (ref 78.0–100.0)
Platelets: 439 10*3/uL — ABNORMAL HIGH (ref 150–400)
RBC: 3.86 MIL/uL — ABNORMAL LOW (ref 3.87–5.11)
RDW: 16.5 % — ABNORMAL HIGH (ref 11.5–15.5)
WBC: 15.6 10*3/uL — ABNORMAL HIGH (ref 4.0–10.5)

## 2011-06-24 LAB — DIFFERENTIAL
Basophils Absolute: 0 10*3/uL (ref 0.0–0.1)
Eosinophils Absolute: 0 10*3/uL (ref 0.0–0.7)
Eosinophils Relative: 0 % (ref 0–5)
Lymphocytes Relative: 6 % — ABNORMAL LOW (ref 12–46)
Lymphs Abs: 0.9 10*3/uL (ref 0.7–4.0)
Monocytes Absolute: 1.2 10*3/uL — ABNORMAL HIGH (ref 0.1–1.0)
Neutro Abs: 13.5 10*3/uL — ABNORMAL HIGH (ref 1.7–7.7)
Neutrophils Relative %: 87 % — ABNORMAL HIGH (ref 43–77)

## 2011-06-24 LAB — PROTEIN, BODY FLUID: Total protein, fluid: 3.3 g/dL

## 2011-06-24 LAB — ALBUMIN, FLUID (OTHER): Albumin, Fluid: 2 g/dL

## 2011-06-24 LAB — GLUCOSE, CAPILLARY
Glucose-Capillary: 121 mg/dL — ABNORMAL HIGH (ref 70–99)
Glucose-Capillary: 106 mg/dL — ABNORMAL HIGH (ref 70–99)

## 2011-06-25 LAB — BASIC METABOLIC PANEL
CO2: 41 mEq/L (ref 19–32)
Chloride: 98 mEq/L (ref 96–112)
GFR calc non Af Amer: 55 mL/min — ABNORMAL LOW (ref >60)
Glucose, Bld: 96 mg/dL (ref 70–99)
Potassium: 3.2 mEq/L — ABNORMAL LOW (ref 3.5–5.1)
Sodium: 144 mEq/L (ref 135–145)

## 2011-06-25 LAB — CBC
HCT: 34.2 % — ABNORMAL LOW (ref 36.0–46.0)
Hemoglobin: 10.5 g/dL — ABNORMAL LOW (ref 12.0–15.0)
MCHC: 30.7 g/dL (ref 30.0–36.0)
RBC: 4.07 MIL/uL (ref 3.87–5.11)
WBC: 10.1 10*3/uL (ref 4.0–10.5)

## 2011-06-26 LAB — BASIC METABOLIC PANEL
BUN: 23 mg/dL (ref 6–23)
CO2: 39 mEq/L — ABNORMAL HIGH (ref 19–32)
Calcium: 9.9 mg/dL (ref 8.4–10.5)
Creatinine, Ser: 0.93 mg/dL (ref 0.50–1.10)
GFR calc Af Amer: >60 mL/min (ref >60)
Glucose, Bld: 103 mg/dL — ABNORMAL HIGH (ref 70–99)
Sodium: 145 mEq/L (ref 135–145)

## 2011-06-26 LAB — CBC
HCT: 39.7 % (ref 36.0–46.0)
Hemoglobin: 12.1 g/dL (ref 12.0–15.0)
MCH: 25.7 pg — ABNORMAL LOW (ref 26.0–34.0)
MCV: 84.5 fL (ref 78.0–100.0)
RBC: 4.7 MIL/uL (ref 3.87–5.11)
RDW: 15.9 % — ABNORMAL HIGH (ref 11.5–15.5)

## 2011-06-27 LAB — BODY FLUID CULTURE
Culture: NO GROWTH
Gram Stain: NONE SEEN

## 2011-06-27 LAB — BASIC METABOLIC PANEL
CO2: 38 mEq/L — ABNORMAL HIGH (ref 19–32)
Calcium: 9.6 mg/dL (ref 8.4–10.5)
Creatinine, Ser: 0.93 mg/dL (ref 0.50–1.10)
GFR calc Af Amer: >60 mL/min (ref >60)
Glucose, Bld: 106 mg/dL — ABNORMAL HIGH (ref 70–99)

## 2011-06-27 LAB — CBC
Hemoglobin: 12.4 g/dL (ref 12.0–15.0)
MCH: 25.9 pg — ABNORMAL LOW (ref 26.0–34.0)
MCHC: 31.5 g/dL (ref 30.0–36.0)
MCV: 82.4 fL (ref 78.0–100.0)
Platelets: 438 10*3/uL — ABNORMAL HIGH (ref 150–400)
RBC: 4.78 MIL/uL (ref 3.87–5.11)

## 2011-06-28 LAB — CULTURE, BLOOD (ROUTINE X 2)
Culture  Setup Time: 201209052232
Culture  Setup Time: 201209052232

## 2011-07-07 NOTE — Discharge Summary (Signed)
[name] [name], [name] [name] NO.:  [id number]  MEDICAL RECORD NO.:  [id number]  LOCATION:  4713                         FACILITY:  [facility]  PHYSICIAN:  Blanch Media, M.D.DATE OF BIRTH:  [birth year]  DATE OF ADMISSION:  06/22/2011 DATE OF DISCHARGE:  06/27/2011                              DISCHARGE SUMMARY   DISCHARGE DIAGNOSES: 1. Acute-on-chronic congestive heart failure exacerbation, likely     diastolic dysfunction. 2. Bilateral pleural effusions, chronic. 3. Atrial fibrillation with rapid ventricular rate. 4. Anxiety/depression. 5. Hypothyroidism. 6. Chronic anemia. 7. Adult failure to thrive. 8. History of recurrent Clostridium difficile colitis. 9. History of recurrent urinary tract infections. 10.Remote cerebrovascular accident. 11.Osteoarthritis.  DISCHARGE MEDICATIONS: 1. Furosemide 80 mg by mouth twice daily. 2. Diltiazem CD-XT 240 mg by mouth daily. 3. Xarelto (rivaroxaban) 15 mg by mouth daily. 4. Albuterol nebulizer 2.5 mg inhaled every 6 hours as needed for     shortness of breath. 5. Citalopram 20 mg by mouth daily. 6. K-Dur 40 mEq by mouth daily. 7. Levothyroxine 100 mcg by mouth daily. 8. Ferrous sulfate 325 by mouth daily. 9. Acetaminophen 650 by mouth every 6 hours as needed for pain. 10.Mirtazapine 15 mg by mouth daily at bedtime. 11.Ondansetron 4 mg by mouth daily as needed for nausea. 12.Multivitamin 1 tablet by mouth daily. 13.MiraLax 17 g by mouth daily as needed for constipation. 14.Docusate 100 mg by mouth daily as needed for constipation. 15.Procel powder 1 scoop by mouth 3 times a day. 16.Voltaren (diclofenac sodium) 1% gel 2 g topically twice daily as     needed for right neck pain. 17.Florastor (Saccharomyces boulardii ) 2 capsules by mouth twice     daily.  DISPOSITION AND FOLLOWUP:  [name] [name] was discharged from [facility] in stable and improved condition.  She will be discharged to a skilled nursing  facility.  Nursing staff, please obtain daily weights and heart rate.  If any of the following changes are noted, please contact patient's physician immediately for outpatient management; 1. Sustained heart rate greater than 120 beats per minute. 2. Weight increase of 2 kg or more over 1-2 days. 3. Room air saturation less than 90%. 4. Sustained respiratory rate greater than 24.  The patient will follow up the skilled facility physician in 1 week at a specific date and time yet to be scheduled.  At this time, please assess the patient for stable respiratory and cardiac status.  If anytime the patient has increasing shortness of breath with chest x-ray findings that show progression of her pleural effusions she may benefit from early outpatient therapeutic thoracentesis.  Please also assess the patient for her level of comfort and pain and manage her symptoms accordingly.  PROCEDURES PERFORMED:  Therapeutic thoracentesis - 1200 mL of fluid withdrawn from the patient's left pleural cavity.  CONSULTATIONS:  Pulmonary critical care team.  ADMITTING HISTORY AND PHYSICAL:  The patient is an 77 year old female from [place] with a complicated past medical history including atrial fibrillation, congestive heart failure, hypertension, pneumonia, urinary tract infections, and multiple hospitalizations this year, who presents with 1-week history of shortness of breath.  The patient's son states that she has baseline shortness of breath,  but went into atrial fibrillation last Tuesday, which resulted in gradual worsening of respiratory distress and oxygen requirement.  Today, the patient's family contacted the patient's PCP Dr. Phillips Odor, who recommended taking her to the emergency department.  EMS were called and treated the patient with CPAP, 125 mg Solu-Medrol, and 5 mg albuterol nebulizer. The patient denies any associated chest pain, abdominal pain, dizziness, or light headedness.   History is limited due to the patient is on BiPAP and end respiratory distress.  The patient had recent diagnosis of pneumonia on August 28, treated with Avelox since then.  The patient also had therapeutic thoracentesis of right and left lungs in April, yielding 680 mL and 1000 mL of fluid, respectively.  PAST MEDICAL HISTORY:  Congestive heart failure, bilateral pleural effusions, chronic, atrial fibrillation on rivaroxaban for anticoagulation, anxiety and depression, hypothyroidism, chronic anemia, adult failure to thrive, history of recurrent C. difficile colitis, history of recurrent UTIs, remote CVA, osteoarthritis, inguinal hernia repaired in 2011, GI bleed, bladder prolapse, surgically corrected.  PHYSICAL EXAMINATION:  VITAL SIGNS:  Temperature 97.9, pulse 105-126, blood pressure 141/83, respiratory rate 26, O2 sat 91% on room air, and 97% on BiPAP. GENERAL:  Awake and alert, mild-to-moderate respiratory distress on BiPAP, the patient can answer questions appropriately and follows command. HEAD:  Normocephalic, atraumatic. EYES:  Pupils equal, round, reactive to light, extraocular movement intact, no signs of anemia or jaundice. NOSE:  Mucous membranes moist, noninflamed, nonerythematous. THROAT:  Oropharynx nonerythematous, no exudate appreciated. NECK:  No deformities, masses, or tenderness noted.  Supple, no carotid bruits, JVD present 11 cm. LUNGS:  Crackles on right upper to mid lung, mild scattered expiratory wheezes, decreased breath sounds bilaterally at bases and left upper lobe, mild accessory muscle use. HEART:  Irregularly irregular, tachycardic, S1 and S2 normal without gallops, murmurs, rubs, no peripheral edema. ABDOMEN:  Bowel sounds hypoactive, soft, slightly distended, nontender. EXTREMITIES:  No pretibial edema, increased pigmentation distal lower extremities. NEUROLOGIC:  Alert, oriented x3, cranial nerves II through XII grossly intact.  Motor strength  is 5/5 in all extremities, sensation to light touch intact, cerebellar signs negative. SKIN:  No visible rashes or scars.  ADMITTING LABS:  White blood cells 11.8, hemoglobin 10.1, hematocrit 33.0, platelets 481, MCV 83.8, ANC 10.6, sodium 143, potassium 3.6, chloride 100, CO2 33, BUN 20, creatinine 0.79, glucose 149, calcium 9.5. LFTs, T-bili 0.3, direct bili less than 0.1, alk phos 57, AST 24, ALT 21, total protein 6.4, albumin 2.9, troponin-I 0, BNP 6633.  ABG, pH 7.435, PCO2 49.4, PO2 56.0, HCO3 33.2, base excess 8.0, sat 89%.  PT 19.7, INR 1.64, APTT 35.  Urinalysis, color yellow, appearance clear, specific gravity 1.015, pH 5.0.  Urine glucose negative, bilirubin negative, ketones negative, blood negative, protein negative, urobilinogen 0.2, nitrite negative, leucocytes negative.  Admitting EKG atrial fibrillation, variable rate approximately 105, normal axis, narrow QRS, no ST changes noted, diffuse T-wave flattening, no significant change since last EKG.  Admitting chest x-ray, probable worsening of left greater than pleural effusion and bibasilar atelectasis with suspected mild edema.  HOSPITAL COURSE: 1. Shortness of breath.  The patient was admitted to Step-Down Unit in     moderate respiratory distress, requiring BiPAP for adequate     oxygenations.  Shortness of breath likely multifactorial, including     progression of bilateral pleural effusions, acute congestive heart     failure exacerbation, and uncontrolled atrial fibrillation.  The     patient was diuresed with IV furosemide, and AFib  was rate     controlled with diltiazem drip.  Interventions were initially     inadequate to wean the patient off BiPAP, with 2 trials of BiPAP     failing in the first 24 hours.  The patient's furosemide dose was     increased, and she received therapeutic thoracentesis by Pulmonary     critical care team, for symptomatic relief, with 1200 mL of fluid     removed from the left lung.   The patient's shortness of breath     resolved significantly following increase diuresis and therapeutic     tab.  The patient was able to transition from BiPAP to nasal     cannula and transferred from step down to telemetry unit.  The     patient was able to tolerate p.o. intake, and thus was transition     back to p.o. meds.  Additional therapeutic thoracentesis with     consider to following day for further release, but risks of the     procedure outweighed the additional potential therapeutic benefit.     The patient remained asymptomatic for the rest of this     hospitalization. 2. Acute-on-chronic congestive heart failure exacerbation.  The     patient was admitted with BNP of 6633 (increased from 2451 on     June 14, 2011 and 2085 on February 03, 2011).  The patient had     symptomatic improvement of congestive heart failure exacerbation     with diuresis and BiPAP, especially once left pleural effusion was     drained.  The patient was responded well to increased doses of     furosemide, and she will be maintained on higher home dose (80 mg     b.i.d.) then before admission helped prevent future exacerbation. 3. Bilateral pleural effusions, chronic, managed as above.  Serial     chest x-ray showed improvement of left pleural effusion, but it was     not completely gone.  Followup chest x-ray showed;     a.     A slight decrease in left effusion following thoracentesis.     b.     Moderately large bilaterally pleural effusions remaining.     c.     Enlarged heart.     d.     Basilar compression atelectasis with consolidation noted.     e.     No pneumothorax.  The patient may need additional      therapeutic thoracentesis on an outpatient basis if symptoms      return. 4. Atrial fibrillation.  The patient's uncontrolled AFib was managed     with diltiazem drip.  Once rate control was achieved and the     patient's shortness of breath was controlled, the patient was able     to  transitioned back to her home diltiazem p.o. dosing.  The     patient is on rivaroxaban for anticoagulation secondary to AFib,     and this medication was resumed.  Discussion with daughter and PCP     about switching in the future to reversible anticoagulant, such as     Coumadin, but daughter and PCP felt benefits of rivaroxaban (once     daily dosing, good performance in trials).  Outweighed the     advantages of the Coumadin (questionably less expensive,     reversible).  At discharge, the patient was rate controlled and     stable. 5. Anxiety and depression.  The patient's acute agitation and anxiety     were managed with as needed Ativan.  The patient's citalopram was     continued once the patient is without BiPAP and back on p.o.     intake. 6. Hypothyroidism.  The patient had a slightly elevated TSH (5.236)     but normal free T4 (102).  The patient's levothyroxine was     continued once the patient is without BiPAP and back on p.o.     intake. 7. Chronic anemia.  The patient's hemoglobin and hematocrit were     monitored closely and remained stable throughout her     hospitalization. 8. Adult failure to thrive.  The patient has had several     hospitalizations this year and steady decline in health.     Management was discussed closely with the patient's PCP, and     treatment goals were developed and approved by family.  The patient     was DNR/DNI, but otherwise received aggressive management of her     underlying conditions as appropriate.  The patients' comfort and     pain control were primary concern throughout hospitalization, with     p.r.n. orders for Ativan for anxiety and morphine for pain. 9. History of recurrent C. Difficile colitis.  Due to the patient's     recurrent C. Difficile colitis, empiric antibiotics (vancomycin and     Cefepime) for possible pneumonia were discontinued as soon as the     patient showed improvement and shortness of breath, pneumonia      became less likely differential.  The patient had no episodes of     diarrhea or evidence of C. Diff colitis during hospitalization.Instead, she was consptipated and was given Colace, Miralax and Fleet Enema.    DISCHARGE VITAL SIGNS:  Temperature 98.4, heart rate 102, blood pressure 104/70, respiratory rate 17, O2 sat 98% on room air.  DISCHARGE LABS:  CBC, white blood count 102, hemoglobin 12.4, hematocrit 39.4, platelets 438.  BMET, sodium 141, potassium 3.7, chloride 96, CO2 0.93, BUN 17, creatinine 0.93, glucose 106, calcium 9.6.    ______________________________ Carrolyn Meiers, MD   ______________________________ Blanch Media, M.D.  Orlene Plum, MS IV dicatating for Drs. Rogelia Boga and Carrolyn Meiers  MH/MEDQ  D:  06/27/2011  T:  06/27/2011  Job:  [id number]  cc:   Edsel Petrin, D.O.  Electronically Signed by Carrolyn Meiers MD on 07/02/2011 01:25:00 PM Electronically Signed by Blanch Media M.D. on 07/07/2011 10:24:54 AM

## 2011-07-15 ENCOUNTER — Inpatient Hospital Stay (HOSPITAL_COMMUNITY)
Admission: EM | Admit: 2011-07-15 | Discharge: 2011-07-23 | DRG: 291 | Disposition: A | Discharge status: Skilled Nursing Facility | Payer: Medicare Other | Source: Ambulatory Visit | Attending: Infectious Diseases | Admitting: Infectious Diseases

## 2011-07-15 ENCOUNTER — Emergency Department (HOSPITAL_COMMUNITY): Payer: Medicare Other

## 2011-07-15 ENCOUNTER — Encounter: Payer: Self-pay | Admitting: Internal Medicine

## 2011-07-15 DIAGNOSIS — R627 Adult failure to thrive: Secondary | ICD-10-CM | POA: Diagnosis present

## 2011-07-15 DIAGNOSIS — Z79899 Other long term (current) drug therapy: Secondary | ICD-10-CM

## 2011-07-15 DIAGNOSIS — E876 Hypokalemia: Secondary | ICD-10-CM | POA: Diagnosis present

## 2011-07-15 DIAGNOSIS — B372 Candidiasis of skin and nail: Secondary | ICD-10-CM | POA: Diagnosis present

## 2011-07-15 DIAGNOSIS — I509 Heart failure, unspecified: Secondary | ICD-10-CM | POA: Diagnosis present

## 2011-07-15 DIAGNOSIS — Z66 Do not resuscitate: Secondary | ICD-10-CM | POA: Diagnosis present

## 2011-07-15 DIAGNOSIS — I4891 Unspecified atrial fibrillation: Secondary | ICD-10-CM | POA: Diagnosis present

## 2011-07-15 DIAGNOSIS — I10 Essential (primary) hypertension: Secondary | ICD-10-CM | POA: Diagnosis present

## 2011-07-15 DIAGNOSIS — A0472 Enterocolitis due to Clostridium difficile, not specified as recurrent: Secondary | ICD-10-CM | POA: Diagnosis not present

## 2011-07-15 DIAGNOSIS — I5033 Acute on chronic diastolic (congestive) heart failure: Principal | ICD-10-CM | POA: Diagnosis present

## 2011-07-15 DIAGNOSIS — J189 Pneumonia, unspecified organism: Secondary | ICD-10-CM | POA: Diagnosis present

## 2011-07-15 DIAGNOSIS — D509 Iron deficiency anemia, unspecified: Secondary | ICD-10-CM | POA: Diagnosis present

## 2011-07-15 DIAGNOSIS — L8991 Pressure ulcer of unspecified site, stage 1: Secondary | ICD-10-CM | POA: Diagnosis present

## 2011-07-15 DIAGNOSIS — F341 Dysthymic disorder: Secondary | ICD-10-CM | POA: Diagnosis present

## 2011-07-15 DIAGNOSIS — E039 Hypothyroidism, unspecified: Secondary | ICD-10-CM | POA: Diagnosis present

## 2011-07-15 DIAGNOSIS — L89109 Pressure ulcer of unspecified part of back, unspecified stage: Secondary | ICD-10-CM | POA: Diagnosis present

## 2011-07-15 LAB — DIFFERENTIAL
Basophils Absolute: 0 10*3/uL (ref 0.0–0.1)
Lymphocytes Relative: 3 % — ABNORMAL LOW (ref 12–46)
Neutro Abs: 12.9 10*3/uL — ABNORMAL HIGH (ref 1.7–7.7)

## 2011-07-15 LAB — CK TOTAL AND CKMB (NOT AT ARMC): Relative Index: INVALID (ref 0.0–2.5)

## 2011-07-15 LAB — BASIC METABOLIC PANEL
BUN: 38 mg/dL — ABNORMAL HIGH (ref 6–23)
Creatinine, Ser: 1.02 mg/dL (ref 0.50–1.10)
GFR calc Af Amer: >60 mL/min (ref >60)
GFR calc non Af Amer: 52 mL/min — ABNORMAL LOW (ref >60)
Glucose, Bld: 136 mg/dL — ABNORMAL HIGH (ref 70–99)

## 2011-07-15 LAB — LACTIC ACID, PLASMA: Lactic Acid, Venous: 1.5 mmol/L (ref 0.5–2.2)

## 2011-07-15 LAB — URINALYSIS, ROUTINE W REFLEX MICROSCOPIC
Bilirubin Urine: NEGATIVE
Nitrite: NEGATIVE
Specific Gravity, Urine: 1.011 (ref 1.005–1.030)
Urobilinogen, UA: 0.2 mg/dL (ref 0.0–1.0)
pH: 6.5 (ref 5.0–8.0)

## 2011-07-15 LAB — HEPATIC FUNCTION PANEL
AST: 17 U/L (ref 0–37)
Albumin: 2.7 g/dL — ABNORMAL LOW (ref 3.5–5.2)
Alkaline Phosphatase: 63 U/L (ref 39–117)
Bilirubin, Direct: <0.1 mg/dL (ref 0.0–0.3)
Total Bilirubin: 0.3 mg/dL (ref 0.3–1.2)

## 2011-07-15 LAB — CBC
HCT: 34.3 % — ABNORMAL LOW (ref 36.0–46.0)
Hemoglobin: 11 g/dL — ABNORMAL LOW (ref 12.0–15.0)
MCH: 26.2 pg (ref 26.0–34.0)
MCHC: 32.1 g/dL (ref 30.0–36.0)
Platelets: 225 10*3/uL (ref 150–400)
RDW: 16.7 % — ABNORMAL HIGH (ref 11.5–15.5)
WBC: 14.5 10*3/uL — ABNORMAL HIGH (ref 4.0–10.5)

## 2011-07-15 LAB — URINE MICROSCOPIC-ADD ON

## 2011-07-15 LAB — POCT I-STAT TROPONIN I
Troponin i, poc: 0 ng/mL (ref 0.00–0.08)
Troponin i, poc: 0.01 ng/mL (ref 0.00–0.08)

## 2011-07-15 NOTE — H&P (Signed)
Hospital Admission Note Date: 07/15/2011  Patient name: [name] [name] Medical record number: [id number] Date of birth: 07/23/1924 Age: 77 y.o. Gender: female PCP: Anderson Malta, DO  Medical Service:  Internal Medicine Teaching Service B2  Attending physician:  Dr. Anderson Malta    Resident [phone]):  Dr. Carrolyn Meiers   Pager:  [phone] Resident (R1):  Dr. Genella Mech   Pager:  [phone]  Chief Complaint: Cough, Vomiting per patient  History of Present Illness: [name] [name] is a 77 year old woman with a history of chronic atrial fibrillation, recurrent bilateral pleural effusions, possible diastolic heart failure, HTN, and recurrent UTI who presents to the ED from her nursing facility.  On arrival, her blood oxygen saturation was only 86% on room air.  We cannot get a full history for her as no family is present during our interview and the patient has difficulty giving a full history.  The patient reports some non-productive cough over the last couple days as well.  She is not significantly short of breath.  She had a low grade fever up to 100.87F in the ED.  We do not have records of any fevers from the nursing home.  No CP.    She also reports vomiting recently.  She cannot say how long this has been going on, but at least for a couple of days.  Per the ED physician, her family saw some yellow material on her shirt.  No vomiting witnessed in the ED.  Patient denies diarhea or constipation.  She has some lower abdominal pain but no upper abdominal pain.  She also has pain after she urinates in her perineal area.    She was admitted for shortness of breath and a diagnosis of acute on chronic congestive heart failure earlier this month, and she has a history of chronic bilateral pleural effusions last drained in April 2012 for palliative pleuocentesis.  Findings showed a transudative pleural effusion, thought to be from decreased heart function and low albumin.     Meds:   ACETAMINOPHEN 325 MG, PO, Dose: 2 Tab, q6h, PRN   ALBUTEROL NEBULIZATION, Inhaled, Dose: 1 Neb, q6h, PRN   CITALOPRAM 20 MG, PO, Dose: 1 Tab, Daily   DILTIAZEM CD/XT 240 MG, PO, Dose: 1 Cap, Daily   DOCUSATE 100 MG, PO, Dose: 1 Cap, BID, PRN   Ensure [ Ensure Chocolate Liquid ] , PO, Dose: 237 Ml, BID   FERROUS SULFATE 325 MG, PO, Dose: 1 Tab, Daily   FLORASTOR (SACCHAROMYCES BOULARDII)250mg , PO, Dose: 2 Cap, BID   FUROSEMIDE 80 MG, PO, Dose: 1 tab, BID   KLOR-CON 20 MEQ, PO, Dose: 2 tab, daily   LEVOTHROID (LEVOTHYROXINE) 100 MCG, PO, Dose: 1 Tab, qam   LORAZEPAM 0.5 MG, PO, Dose: 1 tab, q6h, PRN   MIRTAZAPINE 15 MG, PO, Dose: 1 Tab, QHS   MULTIVITAMINS,THERAPEUTIC, PO, Dose: 1 Tab, Daily   ONDANSETRON 4 MG, PO, Dose: 1 Tab, q6h, PRN   POLYETHYLENE GLYCOL 3350, PO, Dose: 17 Gm, Daily, PRN   PROCEL POWDER, PO, Dose: 1 Scoop, TID   VOLTAREN (DICLOFENAC SODIUM ) 1 % GEL, Topical, Dose: 2 Gm, BID, PRN   XARELTO (RIVAROXABAN) 15 MG, PO, Dose: 1 Tab, Daily    Allergies: Penicillins   Past Medical History  Diagnosis Date  . Renal insufficiency   . Hypertension   . A-fib   . Anxiety   . Depression   . Hypothyroidism   . Inguinal hernia     repaired 2011  .  Osteoarthritis   . Cerebral vascular accident   . DJD (degenerative joint disease)   . Recurrent UTI   Possible Diastolic CHF: Echo 60-65% in April 2012.   Past Surgical History  Procedure Date  . Inguinal hernia repair   . Bladder surgery     prolapsed bladder   Family History  Problem Relation Age of Onset  . Coronary artery disease    . Hypertension    . Stroke    . Colon cancer      neg. hx.   History   Social History  . Marital Status: Divorced   Social History Main Topics  . Smoking status: Former Smoker    Types: Cigarettes    Quit date: 10/17/1960  . Alcohol Use: No  . Drug Use: No   Lives at: [facility]  Review of Systems: A thorough review of systems could not be  performed due to patient's inability to give very much history.    Physical Exam: Vitals: T 98.3 on presentation, Tmax in ED 100.8, P 137 (100s->130s), BP 124/63, R 22, 86% on RA -> 95% on 4L O2.  General: somnalent, elderly woman, slurred speech, and cooperative to examination.  Patient has difficulty recalling / describing her symptoms over the past few days. Head: normocephalic and atraumatic.  Eyes: vision grossly intact, pupils equal, pupils round, pupils reactive to light, no injection and anicteric.  Mouth: pharynx pink and moist, no erythema, and no exudates.  Poor dentition.   Neck: 4-5 cm. JVD.  No carotid bruits.  Lungs: normal respiratory effort, no accessory muscle use.  Mild-moderate coarse breaths sounds diffusely, diminished breath sounds lower left lung field. Heart: fast rate, irregularly irregular rhythm, no murmur, no gallop, and no rub.   Abdomen: Suprapubic tenderness, otherwise non-tender.  Soft, normal bowel sounds, no distention, no guarding, no rebound tenderness.                  No flank tenderness.  Buttocks/Perineal: Erythematous skin rash spread across medial proximal thighs and buttocks between gluteal fold below gluteal cleft.  Satellite lesions present.  Stage 1 sacral decubitus ulcer present centrally, positive blanching.    Msk: no joint swelling, no joint warmth, and no redness over joints.  Pulses: 2+ DP/PT pulses bilaterally Extremities: No cyanosis, clubbing. No edema in extremities.  Neurologic: alert & oriented X3, cranial nerves II-XII intact.  Strength 4/5 proximally in both arms and 4/5 distally in both arms.  Strength 4/5 proximally in both legs and 5/5 distally in both legs.  No focal sensation deficits to light touch.  Skin: turgor normal and no rashes.    Lab results: Basic Metabolic Panel:  [facility] 07/15/11 0256  NA 139  K 3.4*  CL 100  CO2 29  GLUCOSE 136*  BUN 38*  CREATININE 1.02  CALCIUM 9.3  MG --  PHOS --    CBC:  Basename 07/15/11 0256  WBC 14.5*  NEUTROABS 12.9*  HGB 11.0*  HCT 34.3*  MCV 81.7  PLT 225   Cardiac Enzymes:  Basename 07/15/11 0256  CKTOTAL 40  CKMB 3.1  CKMBINDEX --      Troponin I, POC                          0.00              0.00-0.08        ng/mL  BNP:  Basename 07/15/11 0256  POCBNP 2721.0*     Urinalysis: Result Name                               Result     Abnl   Normal Range     Units      Perf. Loc.  Color, Urine                              YELLOW            YELLOW  Appearance                                TURBID     a      CLEAR  Specific Gravity                          1.011             1.005-1.030  pH                                         6.5               5.0-8.0  Urine Glucose                             NEGATIVE          NEG              mg/dL  Bilirubin                                  NEGATIVE          NEG  Ketones                                   NEGATIVE          NEG              mg/dL  Blood                                      MODERATE   a      NEG  Protein                                    NEGATIVE          NEG              mg/dL  Urobilinogen                              0.2               0.0-1.0          mg/dL  Nitrite  NEGATIVE          NEG  Leukocytes                                SMALL      a      NEG  Squamous Epithelial / LPF                FEW        a      RARE  WBC / HPF                                 3-6               <3               WBC/hpf  RBC / HPF                                 3-6               <3               RBC/hpf  Bacteria / HPF                            FEW        a      RARE   Imaging results:  Dg Chest Portable 1 View  07/15/2011  *RADIOLOGY REPORT*  Clinical Data: Shortness of breath.  PORTABLE CHEST - 1 VIEW  Comparison: Chest radiograph performed 06/24/2011  Findings: The lungs are well-aerated.  A small to moderate left pleural effusion is again noted,   with worsening left midlung zone and right basilar airspace opacity, which may reflect asymmetric pulmonary edema or pneumonia.  The right-sided pleural effusion has significantly improved.  No pneumothorax is seen.  The cardiomediastinal silhouette is mildly enlarged; calcification is noted within the aortic arch.  No acute osseous abnormalities are seen.  IMPRESSION:  1.  Mildly worsening left midlung zone and right basilar airspace opacification may reflect asymmetric pulmonary edema or pneumonia. Small to moderate left-sided pleural effusion again noted; right- sided pleural effusion has significantly improved. 2.  Mild cardiomegaly.  Original Report Authenticated By: Tonia Ghent, M.D.    Other results: EKG: Fast rate, irregularly irregular rhythm.  Atrial fibrillation with RVR.  Normal axis.  No ST or T wave abnormalities.  No significant change from previous.    Assessment & Plan by Problem:  1. Cough / Oxygen Desaturation: Likely multifactorial. Differential diagnosis includes CHF exacerbation versus pneumonia versus pleural effusion. Chest x-ray today shows improved R basilar airspace opacity with worsening L mid lung assymetric pulmonary edema vs. pneumonia.  Patient does have a history of left pleural effusion which was found to be transudative in the past admission, and patient was admitted earlier this month for likely diastolic CHF exacerbation with Pro BNP > 6600 (admission pro-BNP today >2700, with baseline pro-BNP <700).  Given that the patient is a nursing home resident and has been hospitalized within the past 90 days, has leukocytosis, and is febrile on admission, infectious etiology (HCAP) is very possible.  -We'll admit patient to telemetry -Continue supplemental O2 -Lasix 80 mg IV BID, will soon transition to PO regimen -Albuterol  2.5 mg nebulizer Q6HPRN when necessary for shortness of breath  -Strict I&Os, daily weight  -Will check for Urine Legionella, urine strep  antigen -Blood cultures x 2 pending -Will start vancomycin IV and cefepime IV per pharmacy to empirically treat for HCAP since Ms. Madewell is a residence in a nursing home.  If no improvement, consider adding Moxifloxacin to double cover for pseudomonas and cover for atypicals   2. Vomiting: Unwitnessed in ED, but family reports seeing yellow material on shirt.  Unclear etiology at this time.  Differential includes gastroenteritis, reflux, post-tussive emesis.   -Zofran PRN   3. Atrial fibrillation:  Increased HRs in setting of possible acute infection vs. volume overload.  Recent TSH and Free T4 checked this month.  T4 was within normal limits, TSH was borderline elevated at 5.236. -Diltiazem XL decreased to 120mg  PO daily for now to avoid hypotension in setting of increased Lasix dose on admission -Will continue Xarelto for anticoagulation.   4.Candidal Intertrigo:  Appearance is typical of candidal rash, likely due to repeated irritation from her urinary incontinence.  This is the likely cause of her pain after urinating.  -Air mattress for her hospital bed -Nystatin cream -Urine culture pending to rule out UTI as cause of her dysuria in setting of UA performed via in and out catheter   5. Stage 1 Sacral Decubitus Ulcer: Air mattress to help alleviate pressure in the hospital.   6. Hypertension: Well-controlled.  Will continue to monitor.  Lasix and diltiazem regimen as per above.   7. Hypothyroidism: Will continue levothyroxine at this time.  Recent normal Free T4, will not recheck at this time.   8. Depression and anxiety: Continue Celexa and Remeron   9. Iron Deficiency anemia-  Baseline Hb 10-11. Anemia panel on February 2012 showed an iron of 34, TIBC 263, percent saturation 13, TIBC 229, vitamin B12 506, folate 9, ferritin 51 which is consistent with iron deficiency anemia.  Will continue iron therapy.   10. DVT PPX - Xarelto 15mg  po qd for chronic  anticoagulation   11. Patient is DNR.     R2/3______________________________      R1________________________________  ATTENDING: I performed and/or observed a history and physical examination of the patient.  I discussed the case with the residents as noted and reviewed the residents' notes.  I agree with the findings and plan--please refer to the attending physician note for more details.  Signature________________________________  Printed Name_____________________________

## 2011-07-16 LAB — URINE CULTURE

## 2011-07-17 LAB — CBC
HCT: 33.7 % — ABNORMAL LOW (ref 36.0–46.0)
Hemoglobin: 10.4 g/dL — ABNORMAL LOW (ref 12.0–15.0)
MCV: 82.8 fL (ref 78.0–100.0)
RBC: 4.07 MIL/uL (ref 3.87–5.11)
WBC: 10.4 10*3/uL (ref 4.0–10.5)

## 2011-07-17 LAB — BASIC METABOLIC PANEL
BUN: 20 mg/dL (ref 6–23)
CO2: 37 mEq/L — ABNORMAL HIGH (ref 19–32)
Chloride: 100 mEq/L (ref 96–112)
Creatinine, Ser: 0.84 mg/dL (ref 0.50–1.10)
GFR calc Af Amer: >60 mL/min (ref >60)
Glucose, Bld: 102 mg/dL — ABNORMAL HIGH (ref 70–99)
Potassium: 3.2 mEq/L — ABNORMAL LOW (ref 3.5–5.1)

## 2011-07-18 DIAGNOSIS — J189 Pneumonia, unspecified organism: Secondary | ICD-10-CM

## 2011-07-18 LAB — BASIC METABOLIC PANEL
BUN: 14 mg/dL (ref 6–23)
Creatinine, Ser: 0.89 mg/dL (ref 0.50–1.10)
GFR calc non Af Amer: 57 mL/min — ABNORMAL LOW (ref >90)
Glucose, Bld: 100 mg/dL — ABNORMAL HIGH (ref 70–99)
Potassium: 4.2 mEq/L (ref 3.5–5.1)

## 2011-07-18 LAB — CBC
HCT: 36.4 % (ref 36.0–46.0)
Hemoglobin: 11.7 g/dL — ABNORMAL LOW (ref 12.0–15.0)
MCHC: 32.1 g/dL (ref 30.0–36.0)
MCV: 82.4 fL (ref 78.0–100.0)

## 2011-07-19 LAB — CBC
HCT: 38.3 % (ref 36.0–46.0)
Hemoglobin: 12.1 g/dL (ref 12.0–15.0)
MCH: 25.9 pg — ABNORMAL LOW (ref 26.0–34.0)
MCHC: 31.6 g/dL (ref 30.0–36.0)
MCV: 82 fL (ref 78.0–100.0)
Platelets: 240 10*3/uL (ref 150–400)
RBC: 4.67 MIL/uL (ref 3.87–5.11)
RDW: 16.8 % — ABNORMAL HIGH (ref 11.5–15.5)
WBC: 11 10*3/uL — ABNORMAL HIGH (ref 4.0–10.5)

## 2011-07-19 LAB — BASIC METABOLIC PANEL
CO2: 32 mEq/L (ref 19–32)
Calcium: 9.5 mg/dL (ref 8.4–10.5)
Creatinine, Ser: 0.98 mg/dL (ref 0.50–1.10)
Glucose, Bld: 86 mg/dL (ref 70–99)

## 2011-07-20 LAB — BASIC METABOLIC PANEL
BUN: 20 mg/dL (ref 6–23)
Calcium: 9.6 mg/dL (ref 8.4–10.5)
GFR calc non Af Amer: 51 mL/min — ABNORMAL LOW (ref >90)
Glucose, Bld: 96 mg/dL (ref 70–99)

## 2011-07-21 ENCOUNTER — Inpatient Hospital Stay (HOSPITAL_COMMUNITY): Payer: Medicare Other

## 2011-07-21 DIAGNOSIS — J189 Pneumonia, unspecified organism: Secondary | ICD-10-CM

## 2011-07-21 LAB — CBC
MCH: 26.3 pg (ref 26.0–34.0)
MCHC: 32.3 g/dL (ref 30.0–36.0)
Platelets: 307 10*3/uL (ref 150–400)
RBC: 4.72 MIL/uL (ref 3.87–5.11)

## 2011-07-21 LAB — BASIC METABOLIC PANEL
CO2: 32 mEq/L (ref 19–32)
Calcium: 9.8 mg/dL (ref 8.4–10.5)
GFR calc non Af Amer: 49 mL/min — ABNORMAL LOW (ref >90)
Sodium: 139 mEq/L (ref 135–145)

## 2011-07-21 LAB — CULTURE, BLOOD (ROUTINE X 2)
Culture  Setup Time: 201209280854
Culture: NO GROWTH

## 2011-07-25 NOTE — Discharge Summary (Signed)
Pt was seen for pneumonia and got c dif while in the hospital. She was discharged on flagyl and a long vancomycin taper. Please follow up on needs and try to convince daughter to let hospice be involved. If L pleural effusion if a problem consider pleuRx catheter in the future.

## 2011-08-10 NOTE — Discharge Summary (Signed)
NAMEFELICITY, [name] [name] NO.:  [id number]  MEDICAL RECORD NO.:  [id number]  LOCATION:  3740                         FACILITY:  [facility]  PHYSICIAN:  Fransisco Hertz, M.D.  DATE OF BIRTH:  [birth year]  DATE OF ADMISSION:  07/15/2011 DATE OF DISCHARGE:  07/22/2011                              DISCHARGE SUMMARY   PRIMARY CARE PHYSICIAN:  Dr. Phillips Odor.  DIAGNOSES: 1. Healthcare worker-associated pneumonia. 2. Clostridium difficile infection. 3. Atrial fibrillation. 4. Candidal intertrigo. 5. Stage I decubitus ulcer. 6. Hypertension. 7. Hypothyroidism. 8. Depression. 9. Anxiety. 10.Iron deficiency anemia. 11.Do not resuscitate, do not intubate.  DISCHARGE MEDICATIONS: 1. Lotrisone cream applied topically twice daily. 2. Doxycycline 100 mg tablet take by mouth twice daily for 5 days. 3. Flagyl 500 mg by mouth 3 times a day, take for 10 days. 4. Nystatin cream 1 application topically 3 times a day. 5. Vancomycin 50 mg/mL oral solution, take 125 mg by mouth 4 times a     day for 3 days, then twice a day for 3 days, then daily for 3 days,     then every other day for 3 doses, then every 72 hours for 3 doses,     then stop. 6. Acetaminophen 325 mg 2 capsules by mouth every 6 hours as needed     for pain. 7. Albuterol nebulizer as needed for shortness of breath. 8. Citalopram 20 mg 1 tablet by mouth daily. 9. Diltiazem CD XT 240 mg 1 capsule by mouth daily. 10.Docusate 100 mg 1 capsule twice daily as needed for stool softener. 11.Ensure 1 can by mouth twice daily. 12.Iron sulfate 325 mg 1 tablet by mouth daily. 13.Florastor 250 mg 2 capsules by mouth twice daily. 14.Lasix 80 mg 1 tablet by mouth twice daily. 15.Klor-Con 20 mEq 2 tablets by mouth daily. 16.Levothyroxine 100 mcg 1 tablet by mouth every evening. 17.Lorazepam 0.5 mg 1 tablet by mouth every 6 hours as needed. 18.Mirtazapine 15 mg 1 tablet by mouth daily at bedtime. 19.Multivitamin 1 tablet by mouth  daily. 20.Ondansetron 4 mg 1 tablet by mouth every 6 hours as needed for     nausea. 21.Polyethylene glycol 17 g by mouth daily as needed for constipation. 22.ProSure powder 1 scoop by mouth 3 times a day. 23.Voltaren 1% gel 2 g topically as needed twice daily for pain. 24.Xarelto 15 mg 1 tablet by mouth daily.  DISPOSITION AND FOLLOWUP:  She will be seen back in our clinic with Dr. Phillips Odor at this point.  Please, continue to talk to the family about the Pleurx catheter, their concerns or apprehensions as well about hospice and goals of care and chronic management of [name] [name] chronic medical problems.  PROCEDURE PERFORMED:  None.  CONSULTATION:  None.  BRIEF ADMITTING HISTORY AND PHYSICAL:  This is an 77 year old lady with history of chronic AFib, recurrent bilateral pleural effusions, possible diastolic heart failure, hypertension, and recurrent UTI who comes to our emergency department with oxygen saturation of 86% on room air.  No family present during the interview, so it is hard to get a full history.  The patient has a hard time getting a history due to some problems with memory.  Reports  nonproductive cough over the last several days.  Not significantly clinically short of breath while she is talking. Low-grade fever up to 100.8 degrees Fahrenheit in the ED.  No records from the nursing home.  No chest pain.  She reports some vomiting lately.  No vomiting noticed, however, in the ED.  The patient does not have diarrhea or constipation.  Some lower abdominal pain.  No upper abdominal pain.  Pain in her perineal area after urination.  She was previously admitted this month for shortness of breath and acute on chronic congestive heart failure and does have a history of bilateral pleural effusion drained earlier in this month for pleurocentesis.  HOME MEDICATIONS: 1. Acetaminophen 325 mg p.o. 2 tablets q.6 h. p.r.n. pain. 2. Albuterol nebulizer as needed every 6 hours for  shortness of     breath. 3. Citalopram 20 mg p.o. 1 tablet daily. 4. Diltiazem CD XT 240 mg p.o. 1 capsule daily. 5. Docusate 100 mg p.o. 1 capsule b.i.d. p.r.n. 6. Ensure p.o. 1 cap twice daily. 7. Ferrous sulfate 325 mg 1 tablet daily. 8. Florastor 250 mg 2 caps twice daily. 9. Lasix 80 mg 1 cap by mouth twice daily. 10.Klor-Con 20 mEq 2 capsules once daily. 11.Levothyroxine 100 mcg p.o. 1 cap every morning. 12.Lorazepam 0.5 mg 1 tablet q.6 h. p.r.n. pain anxiety. 13.Mirtazapine 15 mg p.o. 1 tablet nightly. 14.Multivitamin p.o. 1 tablet daily. 15.Ondansetron 4 mg p.o. 1 tablet q.6 h. p.r.n. nausea. 16.Polyethylene glycol 17 g by mouth daily as needed for constipation. 17.ProSure powder 1 scoop t.i.d. p.o. 18.Voltaren gel 1% topical dose 2 g use twice daily as needed for     pain. 19.Xarelto 15 mg p.o. 1 cap daily.  ALLERGIES:  PENICILLIN.  PAST MEDICAL HISTORY: 1. Renal insufficiency. 2. Hypertension. 3. AFib. 4. Anxiety. 5. Depression. 6. Hypothyroidism. 7. Osteoarthritis. 8. History of CVA. 9. DJD. 10.Recurrent UTI.  PAST SURGICAL HISTORY:  Inguinal hernia surgery and prolapse bladder surgery.  EXAM IN EMERGENCY DEPARTMENT:  VITAL SIGNS:  Temperature 98.3, T-max in the ED 100.8, pulse 137, blood pressure 134.63, respirations 22, and saturating 86% on room air, then 95% on 4 liters with nasal cannula. GENERAL:  Somnolent, elderly lady, slurred speech, cooperative to exam, difficulty remembering symptomatology over the few days. HEENT:  Head is normocephalic and atraumatic.  Pupils are equal, round, and reactive to light and accommodation.  Mucous membranes are moist. Poor dentition. NECK:  4-5 cm of JVD.  No carotid bruits. LUNGS:  Normal respiratory effort.  No accessory muscle use.  Mild to moderate coarse breath sounds diffusely.  Diminished breath sounds through her lung field. HEART:  Irregularly irregular rhythm.  No murmurs, rubs, or gallops. ABDOMEN:   Suprapubic tenderness in the abdomen, otherwise nontender, soft, normal bowel sounds.  No distention.  No guarding.  No rebound. No flank tenderness.  In the buttocks and perineum, there is erythematous skin rash present across the medial, proximal thighs and buttocks between gluteal bulge, below the gluteal cleft.  Satellite lesions are present.  Also appreciated stage I sacral decubitus ulcer present centrally, positive blanching. MUSCULOSKELETAL:  No joint swelling.  No joint warmth.  No joint redness.  Pulses are 2+ dorsalis pedis, posterior tibial bilaterally. EXTREMITIES:  No cyanosis or clubbing.  No edema. NEUROLOGICAL:  Awake and oriented x3.  Cranial nerves II-XII intact. Strength 4/5 proximally in both arms, 4/5 distally in both arms, 4/5 proximally in both legs, 5/5 distally in both legs.  No focal sensory deficits  to light touch. SKIN:  Turgor normal.  No rash.  RESULTS ON DAY OF EXAM:  Basic metabolic:  Sodium 139, potassium 3.4, chloride 100, bicarb 29, glucose 139, BUN 38, creatinine 1.00, and calcium 9.3.  CBC:  White count 14.5, hemoglobin 11, and platelets 225. Cardiac enzymes:  One set in the ED was negative.  Pro-BNP was 2721. Urinalysis did show moderate blood, small leukocytes with 3-6 red cells and 3-6 white cells with few bacteria.  There was chest x-ray that did show mild worsening left lung zone, right bibasilar airspace opacification, may reflect asymmetric pulmonary edema or pneumonia, small to moderate left-sided pleural effusion again noted, right-sided pleural effusion significantly improved, mild cardiomegaly.  EKG did show fast rate, irregularly irregular rhythm, AFib, normal axis, no ST-T wave abnormalities, no significant change from previous EKG.  HOSPITAL COURSE BY PROBLEM: 1. Cough, desaturation, likely felt to be due healthcare-associated     pneumonia, diagnosis of CHF exacerbation.  She was carefully     diuresed and treated with antibiotics.   Repeat chest x-ray did show     improvement in the chest x-ray and she did diurese and did have     much better time with her breathing her saturations were held     constant.  We did give her breathing treatments as needed for     breathing and check urine Legionella and strep which were negative.     Blood cultures x2 were checked which were negative.  We did start     with vancomycin, cefepime and then switch to quinolone.  The     patient did then get C. difficile and was switched to doxycycline     and put on treatment for C. difficile and she was improving and     stable as far as her breathing was concerned at the time of this     discharge. 2. C. diff.  The patient was tested positive for C. diff after having     diarrhea and was started on Levaquin.  This was treated with Flagyl     and vancomycin oral and she will be sent home on tapers for the     vancomycin  as well as some continued dosing for the Flagyl.  This     did resolve her diarrhea and it did slow down during the     hospitalization and she was stable at the time of discharge from     this problem. 3. Atrial fibrillation and increased heart rates, possible due to     acute infection versus volume overload.  We did check TSH and T4     which were normal.  Diltiazem was kept at her home dose.  Lasix was     continued.  Xarelto was continued for this.  She was in atrial     fibrillation each time while she was here, but her rate was better     controlled after the infection got under control and she was able     to at the time of discharge get out of this problem. 4. Candidal intertrigo.  The appearance of the perineal rash is likely     to be candida, probably from her urinary incontinence and likely     the cause of her pain after urination, so we did get her an air     mattress.  We did nystatin cream and Lotrisone cream and we did get     urine culture to  rule out UTI which was negative and the patient     was  improving over the hospital course and we will continue to     treat this to hopefully eradicate it in the future. 5. Stage I sacral decubitus ulcer.  We did give her air mattress to     help relieve the pressure being in bed while she was in the     hospital and we would advise continued aggressive wound care     management of this ulcer once she returns to the nursing home. 6. Hypertension.  This was well controlled during this     hospitalization.  We did monitor her and keep her on her home     medications.  There were no problems during this hospitalization. 7. Hypothyroidism.  We did consider her home dose of levothyroxine and     she had recently earlier in the month been checked with her TSH and     T4 which were normal, so her dosing is appropriate at this time. 8. Depression.  Continue Celexa.  Stable. 9. Anxiety.  We will continue some Ativan p.r.n. for her.  Also,     Remeron was added for the depression. 10.Iron deficiency anemia.  She does have laboratory values consistent     with iron deficiency anemia, so we did continue her iron therapy.     If however in the future she is having problems with constipation,     this might be along the culprit, so it would be possible to remove     it at some point in the future. 11.The patient is DNR, DNI.  This was discussed with the family member     as well while the patient was here and this is currently their     wishes. 12.Palliative efforts.  The patient was offered for her chronic     pleural effusions a Pleurx catheter.  At this time, her daughter     had made a decision, however, we will get a decision today and I     will upgrade this discharge summary accordingly.  This was felt to     be a good option for her as this needs to be drained at the nursing     home without her coming to the hospital and risks of hospital-     acquired infections and prolonged hospital stays for shortness of     breath and the son is in  agreement with this.  However, the     daughter is healthcare power of attorney, so we are kind of waiting     on the phone on that one.  If the daughter agrees, Pleurx catheter     will be placed and we will make sure that she is stable prior to     time of discharge.  DISCHARGE VITALS:  Temperature 97.3, pulse 84, respirations 16, blood pressure 136/79, and saturating 98% on 2 liters.  DISCHARGE LABS:  Blood cultures x2 were negative.  Basic metabolic panel showed sodium 139, potassium 3.9, chloride 102, bicarb 32, glucose 86, BUN 19, creatinine 1.01, and calcium of 9.8.  A CBC showed a white count of 7.4, hemoglobin 12.5, and platelet count 307.  A C. difficile PCR was positive.  A urine culture did show some bacteria colonization.  Liver function tests were within normal limits.  Lactic acid was normal.  OTHER IMAGING RESULTS:  There was repeat chest x-ray that was done that did  find improved right basilar aeration and left pleural effusion was enlarged since prior chest x-ray, moderate overlying atelectasis and edema in the left lung, but the right lung was better aerated which was where the pneumonia was thought to be situated.  At this time, the patient is stable for discharge with appropriate followup and will be returned home to her [facility] with or without hospice care depending on what the power of attorney daughter would decide, [name] [name].    ______________________________ Genella Mech, MD   ______________________________ Fransisco Hertz, M.D.    EK/MEDQ  D:  07/22/2011  T:  07/22/2011  Job:  [id number]  cc:   Outpatient Clinic  Electronically Signed by Genella Mech MD on 07/30/2011 10:48:21 AM Electronically Signed by Lina Sayre M.D. on 08/10/2011 03:57:11 PM

## 2011-08-10 NOTE — Discharge Summary (Signed)
[name] [name], [name] [name] NO.:  [id number]  MEDICAL RECORD NO.:  [id number]  LOCATION:  3740                         FACILITY:  [facility]  PHYSICIAN:  Fransisco Hertz, M.D.  DATE OF BIRTH:  April 23, 1924  DATE OF ADMISSION:  07/15/2011 DATE OF DISCHARGE:  07/23/2011                              DISCHARGE SUMMARY   ADDENDUM  This is an addendum to job #[id number].  MEDICATIONS AT DISCHARGE: 1. Lotrisone clean one application topically twice daily. 2. Flagyl 500 mg by mouth 3 times daily for 10 days. 3. Nystatin one application topically 3 times daily of the 100,000     units per gram cream. 4. Vancomycin 50 mg/mL or a solution 125 mg by mouth.  Please take 4     times daily for 5 days, then 2 times daily for 5 days, then take     daily for 5 days, then every other day for 5 doses, then every 72     hours for 5 doses and stop.  Total of 38 days. 5. Tylenol 325 mg 2 tablets by mouth every 6 hours as needed for pain. 6. Albuterol nebulizer every 6 hours as needed for shortness of     breath. 7. Citalopram 20 mg 1 tablet by mouth daily. 8. Diltiazem CD XT 240 mg 1 tablet by mouth daily. 9. Docusate 100 mg 1 capsule by mouth twice daily as needed for stool     softener.  Hold for loose bowel. 10.Ensure 1 can by mouth twice daily. 11.Iron sulfate 325 mg 1 tablet by mouth daily. 12.Florastor 250 mg 2 tablets by mouth twice daily. 13.Furosemide 80 mg 1 tablet by mouth twice daily. 14.Klor-Con 20 mEq 2 tablets by mouth daily. 15.Levothyroxine 100 mcg 1 tablet by mouth every evening. 16.Lorazepam 0.5 mg 1 tablet by mouth every 6 hours as needed for     anxiety. 17.Mirtazapine 15 mg 1 tablet by mouth daily at bedtime. 18.Multivitamin 1 tablet by mouth daily. 19.Ondansetron 4 mg 1 tablet by mouth every 6 hours as needed. 20.Polyethylene glycol 17 mg by mouth daily as needed for     constipation.  Hold for loose bowels or multiple bowel movements     per day. 21.Florastor 500  mg 1 capsule by mouth 3 times a day. 22.Voltaren 1% gel 2 g topically twice daily as needed for pain. 23.Xarelto 15 mg 1 tablet by mouth daily.  HOSPITAL COURSE:  Chronic pleural effusion.  I did speak with the daughter, [name] [name] who is the healthcare power of attorney who did decide that she would not like a PleurX catheter placed at this time and has discussed this as well with her brother, [name] [name] who has also been involved in the care.  It was determined that a therapeutic pass of the fluid was not necessary at this time as the patient is not having shortness of breath or symptoms from it and their benefit versus the risks.  It would be the risk that will outweigh benefit of the procedure.  Further discussion of this would happen at this time.  Again, I will stress that [name] [name] would probably benefit greatly from [facility], and this was stressed to  the daughter and the son as well.  The patient was discharged in good condition.    ______________________________ Genella Mech, MD   ______________________________ Fransisco Hertz, M.D.    EK/MEDQ  D:  07/22/2011  T:  07/22/2011  Job:  [id number]  Electronically Signed by Genella Mech MD on 07/30/2011 10:48:41 AM Electronically Signed by Lina Sayre M.D. on 08/10/2011 03:57:17 PM

## 2011-08-11 ENCOUNTER — Ambulatory Visit: Payer: Medicare Other | Admitting: Internal Medicine

## 2011-09-14 ENCOUNTER — Ambulatory Visit: Payer: Medicare Other | Admitting: Internal Medicine

## 2011-09-20 ENCOUNTER — Ambulatory Visit (INDEPENDENT_AMBULATORY_CARE_PROVIDER_SITE_OTHER): Payer: Medicare Other | Admitting: Internal Medicine

## 2011-09-20 ENCOUNTER — Other Ambulatory Visit: Payer: Self-pay | Admitting: Internal Medicine

## 2011-09-20 ENCOUNTER — Encounter: Payer: Self-pay | Admitting: Internal Medicine

## 2011-09-20 DIAGNOSIS — M1990 Unspecified osteoarthritis, unspecified site: Secondary | ICD-10-CM

## 2011-09-20 DIAGNOSIS — R627 Adult failure to thrive: Secondary | ICD-10-CM

## 2011-09-20 DIAGNOSIS — A0472 Enterocolitis due to Clostridium difficile, not specified as recurrent: Secondary | ICD-10-CM

## 2011-09-20 MED ORDER — HYDROCODONE-ACETAMINOPHEN 5-500 MG PO TABS: 2.0000 | ORAL_TABLET | Freq: Four times a day (QID) | ORAL | Status: DC | PRN

## 2011-09-20 MED ORDER — SACCHAROMYCES BOULARDII 250 MG PO CAPS: 250.0000 mg | ORAL_CAPSULE | Freq: Two times a day (BID) | ORAL | Status: DC

## 2011-09-20 MED ORDER — DILTIAZEM HCL ER 240 MG PO CP24: 240.0000 mg | ORAL_CAPSULE | Freq: Every day | ORAL | Status: DC

## 2011-09-20 NOTE — Progress Notes (Signed)
[name] [name] comes in today for routine f/u and to be evaluated outside of her nursing facility. She is a resident at [facility] and is actually doing very well in a structured environment where she is getting her medications and meals provided. Her mood is the best I have ever seen it and she has gained weight. She seems happy and content with her current situation. She is still working with rehab and trying to regain her strength for walking. Her daughter is with her at today's visit.  Major complaint today if left wrist pain and swelling from propelling her wheelchair. Otherwsie her breathing and pulm symptoms are doing well. No more diarrhea. No NV. Denies leg swelling or chest pain.  Physical Exam:  Right sided chronic crackles, 2/6 SEM, mood great, cooperative, right wrist is slightly warm, swollen and very tender at base of MCP. LE no edema, no skin rashes observed.  Plan: 1. Restart probiotics 2. For OA pain:  Inject wrist, Voltaren Gel, Vicodin Prn 3. Continue with rehab prn 4. O2 qhs and prn

## 2011-09-20 NOTE — Assessment & Plan Note (Signed)
Completed all drug regimens-I recommend she continue probiotics

## 2011-09-20 NOTE — Assessment & Plan Note (Signed)
Improved weight, appetite today. Continue with protein supplement and assistance with meals.

## 2011-09-20 NOTE — Assessment & Plan Note (Signed)
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 Prepped with CHG. Started on Vicodin for pain prn.

## 2011-09-20 NOTE — Patient Instructions (Signed)
Start Probiotics-Florastar twice daily Hydrocodone as needed for pain.

## 2011-10-06 ENCOUNTER — Ambulatory Visit: Payer: Medicare Other | Admitting: Internal Medicine

## 2011-10-23 ENCOUNTER — Encounter (HOSPITAL_COMMUNITY): Payer: Self-pay | Admitting: Emergency Medicine

## 2011-10-23 ENCOUNTER — Inpatient Hospital Stay (HOSPITAL_COMMUNITY)
Admission: EM | Admit: 2011-10-23 | Discharge: 2011-10-25 | DRG: 683 | Disposition: A | Discharge status: Skilled Nursing Facility | Payer: Medicare Other | Attending: Internal Medicine | Admitting: Internal Medicine

## 2011-10-23 ENCOUNTER — Other Ambulatory Visit: Payer: Self-pay

## 2011-10-23 ENCOUNTER — Emergency Department (HOSPITAL_COMMUNITY): Payer: Medicare Other

## 2011-10-23 DIAGNOSIS — Z87440 Personal history of urinary (tract) infections: Secondary | ICD-10-CM

## 2011-10-23 DIAGNOSIS — IMO0002 Reserved for concepts with insufficient information to code with codable children: Secondary | ICD-10-CM

## 2011-10-23 DIAGNOSIS — F329 Major depressive disorder, single episode, unspecified: Secondary | ICD-10-CM | POA: Diagnosis present

## 2011-10-23 DIAGNOSIS — E039 Hypothyroidism, unspecified: Secondary | ICD-10-CM | POA: Diagnosis present

## 2011-10-23 DIAGNOSIS — I4891 Unspecified atrial fibrillation: Secondary | ICD-10-CM | POA: Diagnosis present

## 2011-10-23 DIAGNOSIS — F3289 Other specified depressive episodes: Secondary | ICD-10-CM | POA: Diagnosis present

## 2011-10-23 DIAGNOSIS — Z87891 Personal history of nicotine dependence: Secondary | ICD-10-CM

## 2011-10-23 DIAGNOSIS — Z880 Allergy status to penicillin: Secondary | ICD-10-CM

## 2011-10-23 DIAGNOSIS — J4489 Other specified chronic obstructive pulmonary disease: Secondary | ICD-10-CM | POA: Diagnosis present

## 2011-10-23 DIAGNOSIS — J189 Pneumonia, unspecified organism: Secondary | ICD-10-CM

## 2011-10-23 DIAGNOSIS — J449 Chronic obstructive pulmonary disease, unspecified: Secondary | ICD-10-CM | POA: Diagnosis present

## 2011-10-23 DIAGNOSIS — R404 Transient alteration of awareness: Secondary | ICD-10-CM | POA: Diagnosis present

## 2011-10-23 DIAGNOSIS — N179 Acute kidney failure, unspecified: Principal | ICD-10-CM | POA: Diagnosis present

## 2011-10-23 DIAGNOSIS — J90 Pleural effusion, not elsewhere classified: Secondary | ICD-10-CM | POA: Diagnosis present

## 2011-10-23 DIAGNOSIS — E46 Unspecified protein-calorie malnutrition: Secondary | ICD-10-CM | POA: Diagnosis present

## 2011-10-23 DIAGNOSIS — A0472 Enterocolitis due to Clostridium difficile, not specified as recurrent: Secondary | ICD-10-CM | POA: Diagnosis present

## 2011-10-23 DIAGNOSIS — E860 Dehydration: Secondary | ICD-10-CM | POA: Diagnosis present

## 2011-10-23 DIAGNOSIS — Z66 Do not resuscitate: Secondary | ICD-10-CM | POA: Diagnosis present

## 2011-10-23 DIAGNOSIS — L89109 Pressure ulcer of unspecified part of back, unspecified stage: Secondary | ICD-10-CM | POA: Diagnosis present

## 2011-10-23 DIAGNOSIS — L8991 Pressure ulcer of unspecified site, stage 1: Secondary | ICD-10-CM | POA: Diagnosis present

## 2011-10-23 DIAGNOSIS — R4182 Altered mental status, unspecified: Secondary | ICD-10-CM

## 2011-10-23 DIAGNOSIS — I10 Essential (primary) hypertension: Secondary | ICD-10-CM | POA: Diagnosis present

## 2011-10-23 DIAGNOSIS — R410 Disorientation, unspecified: Secondary | ICD-10-CM | POA: Diagnosis present

## 2011-10-23 DIAGNOSIS — R627 Adult failure to thrive: Secondary | ICD-10-CM

## 2011-10-23 DIAGNOSIS — Z79899 Other long term (current) drug therapy: Secondary | ICD-10-CM

## 2011-10-23 DIAGNOSIS — F411 Generalized anxiety disorder: Secondary | ICD-10-CM | POA: Diagnosis present

## 2011-10-23 DIAGNOSIS — N390 Urinary tract infection, site not specified: Secondary | ICD-10-CM | POA: Insufficient documentation

## 2011-10-23 DIAGNOSIS — Z9981 Dependence on supplemental oxygen: Secondary | ICD-10-CM

## 2011-10-23 LAB — POCT I-STAT, CHEM 8
Calcium, Ion: 1.22 mmol/L (ref 1.12–1.32)
Chloride: 105 mEq/L (ref 96–112)
Creatinine, Ser: 1.2 mg/dL — ABNORMAL HIGH (ref 0.50–1.10)
Glucose, Bld: 102 mg/dL — ABNORMAL HIGH (ref 70–99)
HCT: 36 % (ref 36.0–46.0)

## 2011-10-23 LAB — URINALYSIS, ROUTINE W REFLEX MICROSCOPIC
Glucose, UA: NEGATIVE mg/dL
Protein, ur: NEGATIVE mg/dL
pH: 5.5 (ref 5.0–8.0)

## 2011-10-23 LAB — DIFFERENTIAL
Lymphocytes Relative: 8 % — ABNORMAL LOW (ref 12–46)
Lymphs Abs: 0.8 10*3/uL (ref 0.7–4.0)
Neutro Abs: 7.8 10*3/uL — ABNORMAL HIGH (ref 1.7–7.7)
Neutrophils Relative %: 79 % — ABNORMAL HIGH (ref 43–77)

## 2011-10-23 LAB — CULTURE, BLOOD (ROUTINE X 2): Culture: NO GROWTH

## 2011-10-23 LAB — URINE MICROSCOPIC-ADD ON

## 2011-10-23 LAB — CBC
Platelets: 236 10*3/uL (ref 150–400)
RBC: 4.22 MIL/uL (ref 3.87–5.11)
WBC: 9.9 10*3/uL (ref 4.0–10.5)

## 2011-10-23 MED ORDER — SODIUM CHLORIDE 0.9 % IV SOLN
INTRAVENOUS | Status: DC
  Administered 2011-10-24 – 2011-10-25 (×2): via INTRAVENOUS

## 2011-10-23 NOTE — ED Notes (Signed)
Patient's son Alyissa Whidbee phone number [phone], call if he needed

## 2011-10-23 NOTE — ED Notes (Signed)
Pt from [facility] home. Son states pt is normally alert and oriented. States he went to visit today and pt is not acting herself. Pt alert to self only upon arrival to room. Currrently on vancomycin for c-diff.

## 2011-10-23 NOTE — H&P (Signed)
Date: 10/23/2011                  Patient Name:  [name] [name]  MRN: [id number]  DOB: December 30, 1923  Age / Sex: 78 y.o., female   PCP: Genella Mech, MD, MD                 Medical Service: Internal Medicine Teaching Service                 Attending Physician: Dr. Blanch Media     First Contact: Dr. Candy Sledge  Pager: [phone]  Second Contact: Dr. Anselm Jungling  Pager: [phone]              After Hours (After 5pm / weekends / holidays): First Contact  Pager: [phone]   Second Contact  Pager: [phone]     Chief Complaint: altered mental status  History of Present Illness: Patient is a 78 y.o. female with a PMHx of atrial fibrillation, hypothyroidism, chronic pleural effusions, who presents to [facility] for evaluation of altered mental status. Pt's son provides most of the details of he history, although pt is more lucid currently per son's recollection. Pt was in her usual state of health until approximately 10-12 days ago, at which time she had developed productive cough and was reportedly having decreased oxygen saturations into the 80s, for which a portable chest xray was performed and indicated a pneumonia, for which she was treated with levaquin 500 mg PO starting on 12/19 for 7 day course. Last Thursday she developed diarrhea and was then treated with vancomycin 250 mg PO q 6 hrs for 14 days. This is day 2 of treatment. She had 2 diarrheas this morning per nursing home. Her breathing status has improved back to her baseline. She is on nighttime oxygen therapy chronically.   Current Outpatient Medications: Current Facility-Administered Medications  Medication Dose Route Frequency Provider Last Rate Last Dose  . 0.9 %  sodium chloride infusion   Intravenous Continuous Nelia Shi, MD       Current Outpatient Prescriptions  Medication Sig Dispense Refill  . acetaminophen (TYLENOL) 325 MG tablet Take 325 mg by mouth every 6 (six) hours as needed. For pain.      . citalopram (CELEXA)  20 MG tablet Take 20 mg by mouth daily.        . clotrimazole-betamethasone (LOTRISONE) cream Apply 1 application topically 2 (two) times daily.        . diclofenac sodium (VOLTAREN) 1 % GEL Apply 1 application topically 2 (two) times daily as needed. For pain.      Marland Kitchen diltiazem (DILACOR XR) 240 MG 24 hr capsule Take 240 mg by mouth daily.        Marland Kitchen docusate sodium (COLACE) 100 MG capsule Take 100 mg by mouth 2 (two) times daily.        Marland Kitchen ENSURE (ENSURE) Take 237 mLs by mouth 3 (three) times daily between meals.        . ferrous sulfate 325 (65 FE) MG tablet Take 325 mg by mouth daily with breakfast.        . furosemide (LASIX) 80 MG tablet Take 80 mg by mouth 2 (two) times daily.        Marland Kitchen HYDROcodone-acetaminophen (VICODIN) 5-500 MG per tablet Take 2 tablets by mouth every 6 (six) hours as needed. For pain.       Marland Kitchen KLOR-CON M20 20 MEQ tablet Take 1 tablet by mouth 2 (two) times  daily.      . levothyroxine (SYNTHROID, LEVOTHROID) 100 MCG tablet Take 150 mcg by mouth daily.       Marland Kitchen LORazepam (ATIVAN) 0.5 MG tablet Take 0.5 mg by mouth every 6 (six) hours as needed. For anxiety.      . mirtazapine (REMERON) 15 MG tablet Take 15 mg by mouth at bedtime.        . ondansetron (ZOFRAN) 4 MG tablet Take 4 mg by mouth every 8 (eight) hours as needed. For nausea.      . potassium chloride (KLOR-CON) 20 MEQ packet Take 40 mEq by mouth daily.        . Protein (PROCEL) PACK Take 1 each by mouth 3 (three) times daily.        . Rivaroxaban (XARELTO) 15 MG TABS tablet Take 15 mg by mouth daily.        Marland Kitchen saccharomyces boulardii (FLORASTOR) 250 MG capsule Take 250 mg by mouth 2 (two) times daily.         Levaquin 500 mg PO 12/19 for 7 days Vancomycin 250 mg PO q 6 hrs  Allergies: Allergies  Allergen Reactions  . Penicillins     REACTION: red face     Past Medical History: Past Medical History  Diagnosis Date  . Renal insufficiency   . Hypertension   . A-fib   . Anxiety   . Depression   .  Hypothyroidism   . Inguinal hernia     repaired 2011  . Osteoarthritis   . Cerebral vascular accident   . DJD (degenerative joint disease)   . Recurrent UTI     Past Surgical History: Past Surgical History  Procedure Date  . Inguinal hernia repair   . Bladder surgery     prolapsed bladder    Family History: Family History  Problem Relation Age of Onset  . Coronary artery disease    . Hypertension    . Stroke    . Colon cancer      neg. hx.    Social History: History   Social History  . Marital Status: Divorced    Spouse Name: N/A    Number of Children: N/A  . Years of Education: N/A   Occupational History  . Not on file.   Social History Main Topics  . Smoking status: Former Smoker    Types: Cigarettes    Quit date: 10/17/1960  . Smokeless tobacco: Not on file  . Alcohol Use: No  . Drug Use: No  . Sexually Active: Not on file   Other Topics Concern  . Not on file   Social History Narrative  . No narrative on file    Review of Systems: Constitutional:  denies fever, chills, diaphoresis, appetite change and fatigue.  HEENT: denies photophobia, eye pain, redness, hearing loss, ear pain, congestion, sore throat, rhinorrhea, sneezing, neck pain, neck stiffness and tinnitus.  Respiratory: denies SOB, DOE, cough, chest tightness, and wheezing.  Cardiovascular: denies chest pain, palpitations and leg swelling.  Gastrointestinal: denies nausea, vomiting, abdominal pain, diarrhea, constipation, blood in stool.  Genitourinary: denies dysuria, urgency, frequency, hematuria, flank pain and difficulty urinating.  Skin: denies pallor, rash and wound.  Psychiatric/ Behavioral: admits to confusion, sleep disturbance and agitation.    Vital Signs: Blood pressure 97/62, pulse 109, temperature 98.6 F (37 C), temperature source Axillary, resp. rate 21, SpO2 97.00%.  Physical Exam: General: Vital signs reviewed and noted. Well-developed, well-nourished, in no acute  distress; alert, appropriate and cooperative  throughout examination. Knows self but not otherwise oriented.   Head: Normocephalic, atraumatic.  Eyes: PERRL, EOMI, No signs of anemia or jaundince.  Nose: Mucous membranes dry, not inflammed, nonerythematous.  Throat: Oropharynx nonerythematous, no exudate appreciated.   Neck: No deformities, masses, or tenderness noted.Supple, No carotid Bruits, no JVD.  Lungs:  Normal respiratory effort. Good air flow heard bilaterally.   Heart: Heart rate irregularly irregular.  Abdomen:  BS normoactive. Soft, Nondistended, non-tender.  No masses or organomegaly.  Extremities: No pretibial edema. Sacral decubitus ulcer likely stage I   Neurologic: A&O X3, CN II - XII are grossly intact. Motor strength is 5/5 in the all 4 extremities, Sensations intact to light touch, Cerebellar signs negative.  Skin: No visible rashes, scars.   Lab results: Basic Metabolic Panel: Recent Labs  [facility] 10/23/11 1913   NA 142   K 4.4   CL 105   CO2 --   GLUCOSE 102*   BUN 23   CREATININE 1.20*   CALCIUM --   MG --   PHOS --   CBC: Recent Labs  Basename 10/23/11 1913 10/23/11 1832   WBC -- 9.9   NEUTROABS -- 7.8*   HGB 12.2 11.9*   HCT 36.0 36.2   MCV -- 85.8   PLT -- 236    Imaging results:  Dg Chest Portable 1 View  10/23/2011  *RADIOLOGY REPORT*  Clinical Data: Altered mental status.  PORTABLE CHEST - 1 VIEW  Comparison: Chest x-ray 07/21/2011.  Findings: The heart is enlarged but stable.  There is a chronic left-sided pleural effusion with overlying atelectasis.  A right lower lobe infiltrate is a new finding.  The bony thorax is intact.  IMPRESSION:  1.  Chronic left pleural effusion with overlying atelectasis. 2.  Right lower lobe pneumonia.  Original Report Authenticated By: P. Loralie Champagne, M.D.     Other results:  EKG (10/23/2011) - Atrial Fibrillation , rate of approximately 103 bpm, normal axis, ST segments: normal.     Assessment &  Plan:  Pt is a 78 y.o. yo female with a PMHx of COPD, A fib, C dif who was admitted on 10/23/2011 with symptoms of altered mental status, which was determined to be secondary to dehydration. Interventions at this time will be focused on rehydration and elucidating other treatable causes.    Acute Delirium - Confusion worsening today thought to be secondary to some dehydration from recent C dif infection. Still having diarrheal bowel movements. Will check U/A to rule out concomitant UTI. If U/A positive will treat and get culture. Chest x-ray shows PNA however clinical resolution and likely is not resolved on chest x-ray but clinically cleared. Will give 125 cc/hr NaCl to volume replete. Will put on fall and C dif precautions.    Acute Kidney Injury - Cr. 1.2 currently and baseline is ~1.00 likely secondary to volume depletion. Will rehydrate and recheck tomorrow.    Recent C dif infection - Will continue vancomycin 250 mg PO q 6 hrs and watch closely. If diarrhea seems to be worsening can re-evaluate treatment. Will also continue pro-biotics.    Recent Pneumonia - Suspect clinically cleared. Oxygen saturation 98% while on nasal cannula, son says cough is better and symptomatically not having signs of respiratory distress.    COPD - Not in exacerbation currently. Will continue home regimen.    A fib. - Pt currently in A fib, rate 103. WIll hold diltiazem and lasix as she is having borderline blood pressures. Once  she has gotten some fluids will probably restart tomorrow. Continue Xarelto.    Sacral Ulcer - Air mattress and wound care to evaluate.    Malnutrition - continue her ensure supplements and protein supplement, on dysphagia 3 diet which is her typical diet at nursing home.    Hypothyroidism - Continue her synthroid. Check TSH.   DNR status  - Daughter, [name] is power of attorney.    DVT PPX - xarelto    Genella Mech PGY-I, Internal Medicine Resident 10/23/2011, 8:44  PM

## 2011-10-23 NOTE — ED Notes (Signed)
Gave old ECG to Dr Radford Pax after I performed 6:22 pm Ottis Stain

## 2011-10-23 NOTE — ED Provider Notes (Signed)
History     CSN: [id number]  Arrival date & time 10/23/11  1807   First MD Initiated Contact with Patient 10/23/11 1819      Chief Complaint  Patient presents with  . Altered Mental Status    (Consider location/radiation/quality/duration/timing/severity/associated sxs/prior treatment) HPI Pt from [facility] nursing home. Son states pt is normally alert and oriented. States he went to visit today and pt is not acting herself. Pt alert to self only upon arrival to room. Currrently on vancomycin for c-diff.  According to her son patient probably began coming confused last night and then was very confused today.  She was cursing at him which is very unusual for her.  She did not recognize him which is also very unusual.  In general he was very lethargic and had to be a rales in order to become aware of her surroundings.  She is recently been placed on medication for C. difficile.  She's had multiple bouts of pneumonia over the last 6 months.  Past Medical History  Diagnosis Date  . Renal insufficiency   . Hypertension   . A-fib   . Anxiety   . Depression   . Hypothyroidism   . Inguinal hernia     repaired 2011  . Osteoarthritis   . Cerebral vascular accident   . DJD (degenerative joint disease)   . Recurrent UTI     Past Surgical History  Procedure Date  . Inguinal hernia repair   . Bladder surgery     prolapsed bladder    Family History  Problem Relation Age of Onset  . Coronary artery disease    . Hypertension    . Stroke    . Colon cancer      neg. hx.    History  Substance Use Topics  . Smoking status: Former Smoker    Types: Cigarettes    Quit date: 10/17/1960  . Smokeless tobacco: Not on file  . Alcohol Use: No    OB History    Grav Para Term Preterm Abortions TAB SAB Ect Mult Living                  Review of Systems  Unable to perform ROS: Mental status change    Allergies  Penicillins  Home Medications   Current Outpatient Rx  Name  Route Sig Dispense Refill  . ACETAMINOPHEN 325 MG PO TABS Oral Take 325 mg by mouth every 6 (six) hours as needed. For pain.    Marland Kitchen CITALOPRAM HYDROBROMIDE 20 MG PO TABS Oral Take 20 mg by mouth daily.      Marland Kitchen CLOTRIMAZOLE-BETAMETHASONE 1-0.05 % EX CREA Topical Apply 1 application topically 2 (two) times daily.      Marland Kitchen DICLOFENAC SODIUM 1 % TD GEL Topical Apply 1 application topically 2 (two) times daily as needed. For pain.    Marland Kitchen DILTIAZEM HCL ER 240 MG PO CP24 Oral Take 240 mg by mouth daily.      Marland Kitchen DOCUSATE SODIUM 100 MG PO CAPS Oral Take 100 mg by mouth 2 (two) times daily.      Marland Kitchen ENSURE PO LIQD Oral Take 237 mLs by mouth 3 (three) times daily between meals.      . FERROUS SULFATE 325 (65 FE) MG PO TABS Oral Take 325 mg by mouth daily with breakfast.      . FUROSEMIDE 80 MG PO TABS Oral Take 80 mg by mouth 2 (two) times daily.      Marland Kitchen  HYDROCODONE-ACETAMINOPHEN 5-500 MG PO TABS Oral Take 2 tablets by mouth every 6 (six) hours as needed. For pain.     Marland Kitchen KLOR-CON M20 20 MEQ PO TBCR Oral Take 1 tablet by mouth 2 (two) times daily.    Marland Kitchen LEVOTHYROXINE SODIUM 100 MCG PO TABS Oral Take 150 mcg by mouth daily.     Marland Kitchen LORAZEPAM 0.5 MG PO TABS Oral Take 0.5 mg by mouth every 6 (six) hours as needed. For anxiety.    Marland Kitchen MIRTAZAPINE 15 MG PO TABS Oral Take 15 mg by mouth at bedtime.      Marland Kitchen ONDANSETRON HCL 4 MG PO TABS Oral Take 4 mg by mouth every 8 (eight) hours as needed. For nausea.    Marland Kitchen POTASSIUM CHLORIDE 20 MEQ PO PACK Oral Take 40 mEq by mouth daily.      Marland Kitchen PROCEL PO PACK Oral Take 1 each by mouth 3 (three) times daily.      Marland Kitchen RIVAROXABAN 15 MG PO TABS Oral Take 15 mg by mouth daily.      Marland Kitchen SACCHAROMYCES BOULARDII 250 MG PO CAPS Oral Take 250 mg by mouth 2 (two) times daily.        BP 97/62  Pulse 109  Temp(Src) 98.6 F (37 C) (Axillary)  Resp 21  SpO2 97%  Physical Exam  Nursing note and vitals reviewed. Constitutional: She appears well-developed and well-nourished. She appears lethargic. No  distress.  HENT:  Head: Normocephalic and atraumatic.  Eyes: Pupils are equal, round, and reactive to light.  Neck: Normal range of motion.  Cardiovascular: Intact distal pulses.  An irregularly irregular rhythm present. Tachycardia present.          Date: 10/23/2011  Rate: 103  Rhythm: atrial fibrillation  QRS Axis: normal  Intervals: normal  ST/T Wave abnormalities: nonspecific T wave changes  Conduction Disutrbances:none:   Old EKG Reviewed: changes noted review of old EKG reveals that the atrial fibrillation is not new.       Pulmonary/Chest: No respiratory distress. She has wheezes.  Abdominal: Normal appearance. She exhibits no distension.  Musculoskeletal: Normal range of motion.  Neurological: She appears lethargic. No cranial nerve deficit. GCS eye subscore is 3. GCS verbal subscore is 4. GCS motor subscore is 6.  Skin: Skin is warm and dry. No rash noted.  Psychiatric: She has a normal mood and affect. Her behavior is normal.    ED Course  Procedures (including critical care time)  Labs Reviewed  CBC - Abnormal; Notable for the following:    Hemoglobin 11.9 (*)    All other components within normal limits  DIFFERENTIAL - Abnormal; Notable for the following:    Neutrophils Relative 79 (*)    Neutro Abs 7.8 (*)    Lymphocytes Relative 8 (*)    Monocytes Relative 13 (*)    Monocytes Absolute 1.2 (*)    All other components within normal limits  POCT I-STAT, CHEM 8 - Abnormal; Notable for the following:    Creatinine, Ser 1.20 (*)    Glucose, Bld 102 (*)    All other components within normal limits  I-STAT, CHEM 8  URINALYSIS, ROUTINE W REFLEX MICROSCOPIC  URINE CULTURE  CULTURE, BLOOD (ROUTINE X 2)  CULTURE, BLOOD (ROUTINE X 2)   Dg Chest Portable 1 View  10/23/2011  *RADIOLOGY REPORT*  Clinical Data: Altered mental status.  PORTABLE CHEST - 1 VIEW  Comparison: Chest x-ray 07/21/2011.  Findings: The heart is enlarged but stable.  There is a  chronic left-sided  pleural effusion with overlying atelectasis.  A right lower lobe infiltrate is a new finding.  The bony thorax is intact.  IMPRESSION:  1.  Chronic left pleural effusion with overlying atelectasis. 2.  Right lower lobe pneumonia.  Original Report Authenticated By: P. Loralie Champagne, M.D.     1. Healthcare-associated pneumonia   2. Change in mental status       MDM  Internal medicine teaching service consulted.  They will come to the emergency department to admit the patient.        Nelia Shi, MD 10/23/11 2001

## 2011-10-24 DIAGNOSIS — J189 Pneumonia, unspecified organism: Secondary | ICD-10-CM

## 2011-10-24 LAB — BASIC METABOLIC PANEL
BUN: 21 mg/dL (ref 6–23)
CO2: 29 mEq/L (ref 19–32)
Chloride: 107 mEq/L (ref 96–112)
Glucose, Bld: 81 mg/dL (ref 70–99)
Potassium: 3.9 mEq/L (ref 3.5–5.1)

## 2011-10-24 MED ORDER — ONDANSETRON HCL 4 MG PO TABS: 4.0000 mg | ORAL_TABLET | Freq: Three times a day (TID) | ORAL | Status: DC | PRN

## 2011-10-24 MED ORDER — LEVOTHYROXINE SODIUM 150 MCG PO TABS
150.0000 ug | ORAL_TABLET | Freq: Every day | ORAL | Status: DC
  Administered 2011-10-24 – 2011-10-25 (×2): 150 ug via ORAL
  Filled 2011-10-24 (×3): qty 1

## 2011-10-24 MED ORDER — LORAZEPAM 0.5 MG PO TABS
0.5000 mg | ORAL_TABLET | Freq: Four times a day (QID) | ORAL | Status: DC | PRN
  Administered 2011-10-24: 0.5 mg via ORAL
  Filled 2011-10-24: qty 1

## 2011-10-24 MED ORDER — MUPIROCIN 2 % EX OINT
1.0000 "application " | TOPICAL_OINTMENT | Freq: Two times a day (BID) | CUTANEOUS | Status: DC
  Administered 2011-10-24 – 2011-10-25 (×3): 1 via NASAL
  Filled 2011-10-24: qty 22

## 2011-10-24 MED ORDER — PRO-STAT SUGAR FREE PO LIQD
30.0000 mL | Freq: Three times a day (TID) | ORAL | Status: DC
  Administered 2011-10-24: 30 mL via ORAL
  Administered 2011-10-24: 18:00:00 via ORAL
  Administered 2011-10-24: 30 mL via ORAL
  Filled 2011-10-24 (×7): qty 30

## 2011-10-24 MED ORDER — SACCHAROMYCES BOULARDII 250 MG PO CAPS
250.0000 mg | ORAL_CAPSULE | Freq: Two times a day (BID) | ORAL | Status: DC
  Administered 2011-10-24 – 2011-10-25 (×4): 250 mg via ORAL
  Filled 2011-10-24 (×5): qty 1

## 2011-10-24 MED ORDER — FUROSEMIDE 80 MG PO TABS: ORAL_TABLET | ORAL | Status: DC

## 2011-10-24 MED ORDER — RIVAROXABAN 15 MG PO TABS
15.0000 mg | ORAL_TABLET | Freq: Every day | ORAL | Status: DC
  Administered 2011-10-24 – 2011-10-25 (×2): 15 mg via ORAL
  Filled 2011-10-24 (×2): qty 1

## 2011-10-24 MED ORDER — FERROUS SULFATE 325 (65 FE) MG PO TABS
325.0000 mg | ORAL_TABLET | Freq: Every day | ORAL | Status: DC
  Administered 2011-10-24 – 2011-10-25 (×2): 325 mg via ORAL
  Filled 2011-10-24 (×3): qty 1

## 2011-10-24 MED ORDER — VANCOMYCIN 50 MG/ML ORAL SOLUTION
250.0000 mg | Freq: Four times a day (QID) | ORAL | Status: DC
  Administered 2011-10-24 – 2011-10-25 (×4): 250 mg via ORAL
  Filled 2011-10-24: qty 5
  Filled 2011-10-24: qty 2
  Filled 2011-10-24 (×4): qty 5
  Filled 2011-10-24: qty 2
  Filled 2011-10-24 (×3): qty 5
  Filled 2011-10-24 (×2): qty 2
  Filled 2011-10-24 (×2): qty 5

## 2011-10-24 MED ORDER — CITALOPRAM HYDROBROMIDE 20 MG PO TABS
20.0000 mg | ORAL_TABLET | Freq: Every day | ORAL | Status: DC
  Administered 2011-10-24 – 2011-10-25 (×2): 20 mg via ORAL
  Filled 2011-10-24 (×2): qty 1

## 2011-10-24 MED ORDER — DICLOFENAC SODIUM 1 % TD GEL
1.0000 "application " | Freq: Two times a day (BID) | TRANSDERMAL | Status: DC | PRN
  Filled 2011-10-24: qty 100

## 2011-10-24 MED ORDER — MIRTAZAPINE 15 MG PO TABS
15.0000 mg | ORAL_TABLET | Freq: Every day | ORAL | Status: DC
  Administered 2011-10-24: 15 mg via ORAL
  Filled 2011-10-24 (×2): qty 1

## 2011-10-24 MED ORDER — PROCEL PO PACK: 1.0000 | PACK | Freq: Three times a day (TID) | ORAL | Status: DC

## 2011-10-24 MED ORDER — CHLORHEXIDINE GLUCONATE CLOTH 2 % EX PADS: 6.0000 | MEDICATED_PAD | Freq: Every day | CUTANEOUS | Status: DC

## 2011-10-24 MED ORDER — BOOST PO LIQD
237.0000 mL | Freq: Three times a day (TID) | ORAL | Status: DC
  Administered 2011-10-24 – 2011-10-25 (×4): 237 mL via ORAL
  Filled 2011-10-24 (×8): qty 237

## 2011-10-24 MED ORDER — ACETAMINOPHEN 325 MG PO TABS: 325.0000 mg | ORAL_TABLET | Freq: Four times a day (QID) | ORAL | Status: DC | PRN

## 2011-10-24 NOTE — H&P (Signed)
Internal Medicine Teaching Service Attending Note Date: 10/24/2011  Patient name: [name] [name]  Medical record number: [id number]  Date of birth: 07/15/1924   I have seen and evaluated [name] [name] and discussed their care with the Residency Team.  Please see Dr Lavonna Monarch note for full details. Briefly, [name] [name] is well known to me and has A Fib and chronic L pleural effusion. She was transferred from [facility] for acute mental status changes. Preceding this AMS change was treatment for PNA with levaquin and then presumptive tx for C Dif with vanc after she developed diarrhea. On the 7th of Jan, she was alert and interactive. She had eaten her breakfast. She had no complaints other than not being able to open the carton of milk. She has had only one stool since admission (less than 24 hours but more than 12 hours). Stool for C diff has not been collected.  Physical Exam: Blood pressure 128/81, pulse 92, temperature 98.2 F (36.8 C), temperature source Oral, resp. rate 17, height 5\' 5"  (1.651 m), weight 117 lb 15.1 oz (53.5 kg), SpO2 96.00%. NAD. Sitting up in bed. Looks great and better than last hospitalization.  ABD : no tenderness  Lab results: Results for orders placed during the hospital encounter of 10/23/11 (from the past 24 hour(s))  CBC     Status: Abnormal   Collection Time   10/23/11  6:32 PM      Component Value Range   WBC 9.9  4.0 - 10.5 (K/uL)   RBC 4.22  3.87 - 5.11 (MIL/uL)   Hemoglobin 11.9 (*) 12.0 - 15.0 (g/dL)   HCT 40.9  81.1 - 91.4 (%)   MCV 85.8  78.0 - 100.0 (fL)   MCH 28.2  26.0 - 34.0 (pg)   MCHC 32.9  30.0 - 36.0 (g/dL)   RDW 78.2  95.6 - 21.3 (%)   Platelets 236  150 - 400 (K/uL)  DIFFERENTIAL     Status: Abnormal   Collection Time   10/23/11  6:32 PM      Component Value Range   Neutrophils Relative 79 (*) 43 - 77 (%)   Neutro Abs 7.8 (*) 1.7 - 7.7 (K/uL)   Lymphocytes Relative 8 (*) 12 - 46 (%)   Lymphs Abs 0.8  0.7 - 4.0 (K/uL)   Monocytes  Relative 13 (*) 3 - 12 (%)   Monocytes Absolute 1.2 (*) 0.1 - 1.0 (K/uL)   Eosinophils Relative 0  0 - 5 (%)   Eosinophils Absolute 0.0  0.0 - 0.7 (K/uL)   Basophils Relative 0  0 - 1 (%)   Basophils Absolute 0.0  0.0 - 0.1 (K/uL)  POCT I-STAT, CHEM 8     Status: Abnormal   Collection Time   10/23/11  7:13 PM      Component Value Range   Sodium 142  135 - 145 (mEq/L)   Potassium 4.4  3.5 - 5.1 (mEq/L)   Chloride 105  96 - 112 (mEq/L)   BUN 23  6 - 23 (mg/dL)   Creatinine, Ser 0.86 (*) 0.50 - 1.10 (mg/dL)   Glucose, Bld 578 (*) 70 - 99 (mg/dL)   Calcium, Ion 4.69  6.29 - 1.32 (mmol/L)   TCO2 28  0 - 100 (mmol/L)   Hemoglobin 12.2  12.0 - 15.0 (g/dL)   HCT 52.8  41.3 - 24.4 (%)  URINALYSIS, ROUTINE W REFLEX MICROSCOPIC     Status: Abnormal   Collection Time   10/23/11  9:51 PM      Component Value Range   Color, Urine YELLOW  YELLOW    APPearance CLEAR  CLEAR    Specific Gravity, Urine 1.016  1.005 - 1.030    pH 5.5  5.0 - 8.0    Glucose, UA NEGATIVE  NEGATIVE (mg/dL)   Hgb urine dipstick NEGATIVE  NEGATIVE    Bilirubin Urine NEGATIVE  NEGATIVE    Ketones, ur NEGATIVE  NEGATIVE (mg/dL)   Protein, ur NEGATIVE  NEGATIVE (mg/dL)   Urobilinogen, UA 0.2  0.0 - 1.0 (mg/dL)   Nitrite NEGATIVE  NEGATIVE    Leukocytes, UA TRACE (*) NEGATIVE   URINE MICROSCOPIC-ADD ON     Status: Abnormal   Collection Time   10/23/11  9:51 PM      Component Value Range   Squamous Epithelial / LPF FEW (*) RARE    WBC, UA 0-2  <3 (WBC/hpf)   RBC / HPF 0-2  <3 (RBC/hpf)   Bacteria, UA FEW (*) RARE    Casts HYALINE CASTS (*) NEGATIVE    Urine-Other MUCOUS PRESENT    MRSA PCR SCREENING     Status: Abnormal   Collection Time   10/24/11  2:48 AM      Component Value Range   MRSA by PCR POSITIVE (*) NEGATIVE   BASIC METABOLIC PANEL     Status: Abnormal   Collection Time   10/24/11  6:37 AM      Component Value Range   Sodium 143  135 - 145 (mEq/L)   Potassium 3.9  3.5 - 5.1 (mEq/L)   Chloride 107  96 - 112  (mEq/L)   CO2 29  19 - 32 (mEq/L)   Glucose, Bld 81  70 - 99 (mg/dL)   BUN 21  6 - 23 (mg/dL)   Creatinine, Ser 1.19  0.50 - 1.10 (mg/dL)   Calcium 9.3  8.4 - 14.7 (mg/dL)   GFR calc non Af Amer 47 (*) >90 (mL/min)   GFR calc Af Amer 55 (*) >90 (mL/min)  TSH     Status: Normal   Collection Time   10/24/11  6:37 AM      Component Value Range   TSH 0.927  0.350 - 4.500 (uIU/mL)    Imaging results:  Dg Chest Portable 1 View  10/23/2011  *RADIOLOGY REPORT*  Clinical Data: Altered mental status.  PORTABLE CHEST - 1 VIEW  Comparison: Chest x-ray 07/21/2011.  Findings: The heart is enlarged but stable.  There is a chronic left-sided pleural effusion with overlying atelectasis.  A right lower lobe infiltrate is a new finding.  The bony thorax is intact.  IMPRESSION:  1.  Chronic left pleural effusion with overlying atelectasis. 2.  Right lower lobe pneumonia.  Original Report Authenticated By: P. Loralie Champagne, M.D.    Assessment and Plan: I agree with the formulated Assessment and Plan with the following changes:  1. Acute delirium - resolved. Per RN who was in the room, she was a bit altered this AM but better as the AM went along. Likely multifactorial including infection, decreased PO, and baseline presumed dementia.  2. Presumed C diff - continue Vanc taper. Pt has h/o relapse / reinfxn. Even though we have no PCR to confirm infxn, pre-test prob high enough to warrant full course tx. Since stools are sig decreased, stable for transfer back to SNF.  3. Recent H/O CAP - pt has no resp sxs. CXR remains abnl but too early to see resolution of  infiltrate. Follow sxs.   4. Acute renal failure - After hydration, creatinine is back to baseline.  5. Sacral ulcer - present on admission. Wound care consult.  Pt's AMS has resolved. Her diarrhea is minimal. She is stable for transfer back to [facility] after her daughter is notified of her improvement.

## 2011-10-24 NOTE — Progress Notes (Signed)
Please see shadow chart for full assessment. Pt is a resident of [facility] and will return at d/c. Pt dtr concerned about d/c today and CSW has confirmed that facility is able to accept pt back at d/c. CSW will follow-Brittnay Pigman-MSW, [phone]

## 2011-10-24 NOTE — Progress Notes (Signed)
Received order for air mattress, this is thought to be for inhospital use, charge nurse on 6700 notified of order Harriett Sine B). Pt is resident of [facility] , [place],  Referral sent to CSW  Re pt  Return to SNF.  Johny Shock RN MPH Case Manager [phone]

## 2011-10-24 NOTE — Progress Notes (Signed)
Subjective: She states that she is feeling great today and has no complaints.    Objective: Vital signs in last 24 hours: Filed Vitals:   10/24/11 0525 10/24/11 0927 10/24/11 0930 10/24/11 1322  BP: 110/66 106/63 104/63 128/81  Pulse: 94 83 91 92  Temp: 98.4 F (36.9 C) 98.1 F (36.7 C)  98.2 F (36.8 C)  TempSrc: Oral Oral  Oral  Resp: 16 17  17   Height:      Weight:      SpO2: 94% 96%  96%   Weight change:   Intake/Output Summary (Last 24 hours) at 10/24/11 1410 Last data filed at 10/24/11 1323  Gross per 24 hour  Intake    480 ml  Output      0 ml  Net    480 ml   Physical Exam: General:  Vital signs reviewed and noted. Well-developed, well-nourished, in no acute distress; alert, appropriate and cooperative throughout examination. Knows self but not otherwise oriented.  Head:  Normocephalic, atraumatic.  Eyes:  PERRL, EOMI, No signs of anemia or jaundince.  Nose:  Mucous membranes dry, not inflammed, nonerythematous.  Throat:  Oropharynx nonerythematous, no exudate appreciated.  Neck:  No deformities, masses, or tenderness noted.Supple, No carotid Bruits, no JVD.  Lungs:  Normal respiratory effort. Good air flow heard bilaterally.  Heart:  Heart rate irregularly irregular.  Abdomen:  BS normoactive. Soft, Nondistended, non-tender. No masses or organomegaly.  Extremities:  No pretibial edema. Sacral decubitus ulcer likely stage I  Neurologic:  A&O X3, CN II - XII are grossly intact. Motor strength is 5/5 in the all 4 extremities, Sensations intact to light touch, Cerebellar signs negative.  Skin:  No visible rashes, scars.   Lab Results: Basic Metabolic Panel:  Lab 10/24/11 1308 10/23/11 1913  NA 143 142  K 3.9 4.4  CL 107 105  CO2 29 --  GLUCOSE 81 102*  BUN 21 23  CREATININE 1.05 1.20*  CALCIUM 9.3 --  MG -- --  PHOS -- --   Liver Function Tests: No results found for this basename: AST:2,ALT:2,ALKPHOS:2,BILITOT:2,PROT:2,ALBUMIN:2 in the last  168 hours No results found for this basename: LIPASE:2,AMYLASE:2 in the last 168 hours No results found for this basename: AMMONIA:2 in the last 168 hours CBC:  Lab 10/23/11 1913 10/23/11 1832  WBC -- 9.9  NEUTROABS -- 7.8*  HGB 12.2 11.9*  HCT 36.0 36.2  MCV -- 85.8  PLT -- 236   Thyroid Function Tests:  Lab 10/24/11 0637  TSH 0.927  T4TOTAL --  FREET4 --  T3FREE --  THYROIDAB --    Micro Results: Recent Results (from the past 240 hour(s))  MRSA PCR SCREENING     Status: Abnormal   Collection Time   10/24/11  2:48 AM      Component Value Range Status Comment   MRSA by PCR POSITIVE (*) NEGATIVE  Final    Studies/Results: Dg Chest Portable 1 View  10/23/2011  *RADIOLOGY REPORT*  Clinical Data: Altered mental status.  PORTABLE CHEST - 1 VIEW  Comparison: Chest x-ray 07/21/2011.  Findings: The heart is enlarged but stable.  There is a chronic left-sided pleural effusion with overlying atelectasis.  A right lower lobe infiltrate is a new finding.  The bony thorax is intact.  IMPRESSION:  1.  Chronic left pleural effusion with overlying atelectasis. 2.  Right lower lobe pneumonia.  Original Report Authenticated By: P. Loralie Champagne, M.D.   Medications: Scheduled Meds:   . Boost  237  mL Oral TID BM  . Chlorhexidine Gluconate Cloth  6 each Topical Q0600  . citalopram  20 mg Oral Daily  . feeding supplement  30 mL Oral TID WC  . ferrous sulfate  325 mg Oral Q breakfast  . levothyroxine  150 mcg Oral Q0600  . mirtazapine  15 mg Oral QHS  . mupirocin ointment  1 application Nasal BID  . Rivaroxaban  15 mg Oral Daily  . saccharomyces boulardii  250 mg Oral BID  . vancomycin  250 mg Oral QID  . DISCONTD: ProCel  1 each Oral TID   Continuous Infusions:   . sodium chloride     PRN Meds:.acetaminophen, diclofenac sodium, LORazepam, ondansetron Assessment/Plan: Pt is a 78 y.o. yo female with a PMHx of COPD, A fib, C dif who was admitted on 10/23/2011 with symptoms of altered  mental status, which was determined to be secondary to dehydration. Interventions at this time will be focused on rehydration and elucidating other treatable causes.  Acute Delirium - Resolved. Likely 2/2 volume depletion. Acute Kidney Injury - Cr. 1.2 on admission but now down to 1.05 likely secondary to volume depletion. Continue IVF Recent C dif infection - Will continue vancomycin 250 mg PO q 6 hrs and  Will also continue pro-biotics. PCR for C.diff needs to be collected.  COPD - Not in exacerbation currently. Will continue home regimen.  A fib. - Stable. Will hold diltiazem and lasix for now for soft blood pressures. (Will resume dialtiazem at discharge). Continue Xarelto.  Sacral Ulcer - Air mattress and wound care.  Malnutrition - continue her ensure supplements and protein supplement, on dysphagia 3 diet which is her typical diet at nursing home.  Hypothyroidism - Continue her synthroid. TSH wnl. DNR status - Daughter, [name] is power of attorney.    LOS: 1 day   [name] [name] 10/24/2011, 2:10 PM

## 2011-10-24 NOTE — Progress Notes (Signed)
Pt transferred from ED, admitted to Rm 6729. Pt comes from [facility]. She is alert to self only and non-ambulatory. Pt is on a dysphagia diet. Skin is intact with a Stage I on her sacrum(5x3x0), Alleyn gentle border applied. Placed on 2L of O2 and bed alarm. Resting quietly at this time, will continue to monitor.  Luther Redo, RN

## 2011-10-24 NOTE — Consult Note (Signed)
WOC consult Note Reason for Consult: Consult requested for sacral wound, stage 1.  Son states she has previously had "a bad wound" at this location, which appears to have healed at this time.  No open wound or drainage.  Pressure Ulcer POA: Yes Measurement: 5X3cm Dressing procedure/placement/frequency: Foam dressing to protect from friction/shear. Will not plan to follow further unless re-consulted.  [address] RN, MSN, [employer]  [phone]

## 2011-10-24 NOTE — Discharge Summary (Signed)
Internal Medicine Teaching [facility] Discharge Note  Name: [name] [name] MRN: [id number] DOB: 1924/09/18 78 y.o.  Date of Admission: 10/23/2011  6:07 PM Date of Discharge: 10/24/2011 Attending Physician: Blanch Media  Discharge Diagnosis: Principal Problem:  *Acute delirium Active Problems:  HYPOTHYROIDISM  DEPRESSION  HYPERTENSION  Atrial fibrillation  UTI'S, RECURRENT  Adult failure to thrive  Protein malnutrition  C. difficile diarrhea   Discharge Medications: Current Discharge Medication List    CONTINUE these medications which have CHANGED   Details  furosemide (LASIX) 80 MG tablet Hold Lasix 1/7 & 1/8, then resume normal dose of 80mg  bid Qty: 60 tablet, Refills: 0      CONTINUE these medications which have NOT CHANGED   Details  acetaminophen (TYLENOL) 325 MG tablet Take 325 mg by mouth every 6 (six) hours as needed. For pain.    citalopram (CELEXA) 20 MG tablet Take 20 mg by mouth daily.      clotrimazole-betamethasone (LOTRISONE) cream Apply 1 application topically 2 (two) times daily.      diclofenac sodium (VOLTAREN) 1 % GEL Apply 1 application topically 2 (two) times daily as needed. For pain.    diltiazem (DILACOR XR) 240 MG 24 hr capsule Take 240 mg by mouth daily.      docusate sodium (COLACE) 100 MG capsule Take 100 mg by mouth 2 (two) times daily.      ENSURE (ENSURE) Take 237 mLs by mouth 3 (three) times daily between meals.      ferrous sulfate 325 (65 FE) MG tablet Take 325 mg by mouth daily with breakfast.      HYDROcodone-acetaminophen (VICODIN) 5-500 MG per tablet Take 2 tablets by mouth every 6 (six) hours as needed. For pain.     KLOR-CON M20 20 MEQ tablet Take 1 tablet by mouth 2 (two) times daily.    levothyroxine (SYNTHROID, LEVOTHROID) 100 MCG tablet Take 150 mcg by mouth daily.     LORazepam (ATIVAN) 0.5 MG tablet Take 0.5 mg by mouth every 6 (six) hours as needed. For anxiety.    mirtazapine (REMERON) 15 MG tablet  Take 15 mg by mouth at bedtime.      ondansetron (ZOFRAN) 4 MG tablet Take 4 mg by mouth every 8 (eight) hours as needed. For nausea.    potassium chloride (KLOR-CON) 20 MEQ packet Take 40 mEq by mouth daily.      Protein (PROCEL) PACK Take 1 each by mouth 3 (three) times daily.      Rivaroxaban (XARELTO) 15 MG TABS tablet Take 15 mg by mouth daily.      saccharomyces boulardii (FLORASTOR) 250 MG capsule Take 250 mg by mouth 2 (two) times daily.      vancomycin (VANCOCIN) 125 MG capsule Take 250 mg by mouth 4 (four) times daily.          Disposition and follow-up:   Ms.[name] [name] was discharged from [facility] in stable and improve condition.    Follow-up Appointments: Follow-up Information    Follow up with Genella Mech, MD .        Discharge Orders    Future Orders Please Complete By Expires   Diet - low sodium heart healthy      Increase activity slowly      Discharge instructions      Comments:   Please hold Lasix on 1/8/ & 1/9, and resume on 1/10 normal dose 80mg  bid      Consultations:  None  Procedures Performed:  Dg Chest Portable 1 View  10/23/2011  *RADIOLOGY REPORT*  Clinical Data: Altered mental status.  PORTABLE CHEST - 1 VIEW  Comparison: Chest x-ray 07/21/2011.  Findings: The heart is enlarged but stable.  There is a chronic left-sided pleural effusion with overlying atelectasis.  A right lower lobe infiltrate is a new finding.  The bony thorax is intact.  IMPRESSION:  1.  Chronic left pleural effusion with overlying atelectasis. 2.  Right lower lobe pneumonia.  Original Report Authenticated By: P. Loralie Champagne, M.D.     Admission HPI: Patient is a 78 y.o. female with a PMHx of atrial fibrillation, hypothyroidism, chronic pleural effusions, who presents to [facility] for evaluation of altered mental status. Pt's son provides most of the details of he history, although pt is more lucid currently per son's recollection. Pt was in her  usual state of health until approximately 10-12 days ago, at which time she had developed productive cough and was reportedly having decreased oxygen saturations into the 80s, for which a portable chest xray was performed and indicated a pneumonia, for which she was treated with levaquin 500 mg PO starting on 12/19 for 7 day course. Last Thursday she developed diarrhea and was then treated with vancomycin 250 mg PO q 6 hrs for 14 days. This is day 2 of treatment. She had 2 diarrheas this morning per nursing home. Her breathing status has improved back to her baseline. She is on nighttime oxygen therapy chronically   Hospital Course by problem list: Pt is a 78 y.o. yo female with a PMHx of COPD, A fib, C dif who was admitted on 10/23/2011 with symptoms of altered mental status, which was determined to be secondary to dehydration. Interventions at this time will be focused on rehydration and elucidating other treatable causes.   Acute Delirium - Confusion worsening today thought to be secondary to some dehydration from recent C dif infection. Patient only had 1  bowel movement since hospitalization.  U/A was negative.  Chest x-ray shows PNA however clinical resolution and likely is not resolved on chest x-ray but clinically cleared. Patient was given 125 cc/hr NaCl to volume replete and her mental status improved significantly to baseline.  Acute Kidney Injury - Cr. 1.2 on admission which is likely 2/2 volume depletion.  Repeat BMET shows Cr of 1.05 with IVF.  Recent C dif infection - Will continue vancomycin 250 mg PO q 6 hrs x 8 weeks as outpatient. We continue  pro-biotics.  Recent Pneumonia - Suspect clinically cleared. Oxygen saturation 98% while on nasal cannula, son says cough is better and symptomatically not having signs of respiratory distress.  COPD - Not in exacerbation currently. We continue home regimen.  A fib. -. We hold diltiazem and lasix as she is having borderline blood pressures. Will  resume dialtiazem upon discharge and will continue to hold Lasix for 2 more days while she gets more hydration and then resume her home Lasix dose on 10/27/11. We also continue Xarelto. Sacral Ulcer - Air mattress and wound care.  Malnutrition - continue her ensure supplements and protein supplement, on dysphagia 3 diet which is her typical diet at nursing home.  Hypothyroidism - Continue her synthroid. Check TSH. DNR status - Daughter, [name] is power of attorney.  DVT PPX - xarelto     Discharge Vitals:  BP 128/81  Pulse 92  Temp(Src) 98.2 F (36.8 C) (Oral)  Resp 17  Ht 5\' 5"  (1.651 m)  Wt 117 lb 15.1 oz (53.5  kg)  BMI 19.63 kg/m2  SpO2 96%  Discharge Labs:  Results for orders placed during the hospital encounter of 10/23/11 (from the past 24 hour(s))  CBC     Status: Abnormal   Collection Time   10/23/11  6:32 PM      Component Value Range   WBC 9.9  4.0 - 10.5 (K/uL)   RBC 4.22  3.87 - 5.11 (MIL/uL)   Hemoglobin 11.9 (*) 12.0 - 15.0 (g/dL)   HCT 16.1  09.6 - 04.5 (%)   MCV 85.8  78.0 - 100.0 (fL)   MCH 28.2  26.0 - 34.0 (pg)   MCHC 32.9  30.0 - 36.0 (g/dL)   RDW 40.9  81.1 - 91.4 (%)   Platelets 236  150 - 400 (K/uL)  DIFFERENTIAL     Status: Abnormal   Collection Time   10/23/11  6:32 PM      Component Value Range   Neutrophils Relative 79 (*) 43 - 77 (%)   Neutro Abs 7.8 (*) 1.7 - 7.7 (K/uL)   Lymphocytes Relative 8 (*) 12 - 46 (%)   Lymphs Abs 0.8  0.7 - 4.0 (K/uL)   Monocytes Relative 13 (*) 3 - 12 (%)   Monocytes Absolute 1.2 (*) 0.1 - 1.0 (K/uL)   Eosinophils Relative 0  0 - 5 (%)   Eosinophils Absolute 0.0  0.0 - 0.7 (K/uL)   Basophils Relative 0  0 - 1 (%)   Basophils Absolute 0.0  0.0 - 0.1 (K/uL)  POCT I-STAT, CHEM 8     Status: Abnormal   Collection Time   10/23/11  7:13 PM      Component Value Range   Sodium 142  135 - 145 (mEq/L)   Potassium 4.4  3.5 - 5.1 (mEq/L)   Chloride 105  96 - 112 (mEq/L)   BUN 23  6 - 23 (mg/dL)   Creatinine, Ser 7.82 (*)  0.50 - 1.10 (mg/dL)   Glucose, Bld 956 (*) 70 - 99 (mg/dL)   Calcium, Ion 2.13  0.86 - 1.32 (mmol/L)   TCO2 28  0 - 100 (mmol/L)   Hemoglobin 12.2  12.0 - 15.0 (g/dL)   HCT 57.8  46.9 - 62.9 (%)  URINALYSIS, ROUTINE W REFLEX MICROSCOPIC     Status: Abnormal   Collection Time   10/23/11  9:51 PM      Component Value Range   Color, Urine YELLOW  YELLOW    APPearance CLEAR  CLEAR    Specific Gravity, Urine 1.016  1.005 - 1.030    pH 5.5  5.0 - 8.0    Glucose, UA NEGATIVE  NEGATIVE (mg/dL)   Hgb urine dipstick NEGATIVE  NEGATIVE    Bilirubin Urine NEGATIVE  NEGATIVE    Ketones, ur NEGATIVE  NEGATIVE (mg/dL)   Protein, ur NEGATIVE  NEGATIVE (mg/dL)   Urobilinogen, UA 0.2  0.0 - 1.0 (mg/dL)   Nitrite NEGATIVE  NEGATIVE    Leukocytes, UA TRACE (*) NEGATIVE   URINE MICROSCOPIC-ADD ON     Status: Abnormal   Collection Time   10/23/11  9:51 PM      Component Value Range   Squamous Epithelial / LPF FEW (*) RARE    WBC, UA 0-2  <3 (WBC/hpf)   RBC / HPF 0-2  <3 (RBC/hpf)   Bacteria, UA FEW (*) RARE    Casts HYALINE CASTS (*) NEGATIVE    Urine-Other MUCOUS PRESENT    MRSA PCR SCREENING  Status: Abnormal   Collection Time   10/24/11  2:48 AM      Component Value Range   MRSA by PCR POSITIVE (*) NEGATIVE   BASIC METABOLIC PANEL     Status: Abnormal   Collection Time   10/24/11  6:37 AM      Component Value Range   Sodium 143  135 - 145 (mEq/L)   Potassium 3.9  3.5 - 5.1 (mEq/L)   Chloride 107  96 - 112 (mEq/L)   CO2 29  19 - 32 (mEq/L)   Glucose, Bld 81  70 - 99 (mg/dL)   BUN 21  6 - 23 (mg/dL)   Creatinine, Ser 1.47  0.50 - 1.10 (mg/dL)   Calcium 9.3  8.4 - 82.9 (mg/dL)   GFR calc non Af Amer 47 (*) >90 (mL/min)   GFR calc Af Amer 55 (*) >90 (mL/min)  TSH     Status: Normal   Collection Time   10/24/11  6:37 AM      Component Value Range   TSH 0.927  0.350 - 4.500 (uIU/mL)    Signed: Margaretmary Prisk 10/24/2011, 1:48 PM

## 2011-10-25 LAB — URINE CULTURE
Colony Count: NO GROWTH
Culture  Setup Time: 201301070146

## 2011-10-25 NOTE — Progress Notes (Signed)
Patient ID: [name] [name], female   DOB: 05/17/1924, 78 y.o.   MRN: [id number] Patient discharge to SNF via ambulance 10/25/2011 11:19 AM. All necessary paperwork sent with patient to facility. [name] [name] [facility]

## 2011-10-25 NOTE — Progress Notes (Signed)
Clinical Social Work-CSW confirmed d/c with SNF and arranged [facility] transport back to [facility] with daughter at bedside. No further needs-Jahquan Klugh-MSW, [phone]

## 2012-01-03 ENCOUNTER — Encounter: Payer: Self-pay | Admitting: Internal Medicine

## 2012-01-04 ENCOUNTER — Telehealth: Payer: Self-pay | Admitting: Licensed Clinical Social Worker

## 2012-01-04 NOTE — Telephone Encounter (Signed)
CSW received call from pt's dau/POA, Gavin Pound.  Pt's has outside eye appt scheduled and would like CSW to fax advanced directives to [facility], attn: Romie Jumper [phone].  Dau requesting hard copy mailed to address.  CSW faxed and mailed advanced directives per request.

## 2012-01-27 ENCOUNTER — Encounter: Payer: Self-pay | Admitting: Internal Medicine

## 2012-01-27 ENCOUNTER — Ambulatory Visit (INDEPENDENT_AMBULATORY_CARE_PROVIDER_SITE_OTHER): Payer: Medicare Other | Admitting: Internal Medicine

## 2012-01-27 VITALS — BP 128/82 | HR 88 | Ht 68.0 in

## 2012-01-27 DIAGNOSIS — A0471 Enterocolitis due to Clostridium difficile, recurrent: Secondary | ICD-10-CM | POA: Insufficient documentation

## 2012-01-27 DIAGNOSIS — A0472 Enterocolitis due to Clostridium difficile, not specified as recurrent: Secondary | ICD-10-CM

## 2012-01-27 NOTE — Progress Notes (Signed)
  Subjective:    Patient ID: [name] [name], female    DOB: 30-Jul-1924, 78 y.o.   MRN: [id number]  HPI This elderly demented white woman is here for evaluation of her current C. difficile colitis. Her son is present and provides a history of we have also called the nursing home. She has had 5 episodes of C. difficile colitis in the last year or so according to the son, all associated with antibiotic use for recurrent pneumonia problems. She was hospitalized in January and was treated with vancomycin again. She has not had diarrhea for a month or longer and has completed the vancomycin. She remains on Florastor probiotic prophylaxis. As best we can tell from the history she is not having any abdominal pain. Her son reports that her appetite is improved and she is doing as well as she has denied a long time. She is weak and is essentially wheelchair bound, has to ambulate with assistance. She had some brief rehabilitation and physical therapy but has met her limits and care there.  Medications, allergies, past medical history, past surgical history, family history and social history are reviewed and updated in the EMR.   Review of Systems As per history of present illness.    Objective:   Physical Exam General:  NAD, elderly looking stated age Eyes:   anicteric Lungs:  clear Heart:  S1S2 no rubs, murmurs or gallops Abdomen:  soft and nontender, BS+ Ext:   no edema    Data Reviewed:  January hospital history and physical and discharge summary, lab studies         Assessment & Plan:   1. Recurrent colitis due to Clostridium difficile     Is not currently a problem. I agree with continuing Florastor probiotic prophylaxis. Would be very judicious in the use of antibiotics. Should she require antibiotics again, I would cover her with vancomycin 125 mg by mouth 4 times a day while on the antibiotics to reduce the risk of recurrent Clostridium difficile colitis, given her overall  history. I would see her on an as-needed basis otherwise.  I appreciate the opportunity to care for this patient.  Will copy [employer] and [facility]

## 2012-01-27 NOTE — Patient Instructions (Signed)
Stay on the Florastor probiotic chronically. If antibiotics are needed again in the future would treat with vancomycin 125 mg four times a day while on antibiotics. See GI as needed.

## 2012-02-15 ENCOUNTER — Emergency Department (HOSPITAL_COMMUNITY): Payer: Medicare Other

## 2012-02-15 ENCOUNTER — Inpatient Hospital Stay (HOSPITAL_COMMUNITY)
Admission: EM | Admit: 2012-02-15 | Discharge: 2012-02-17 | DRG: 565 | Disposition: A | Discharge status: Skilled Nursing Facility | Payer: Medicare Other | Attending: Internal Medicine | Admitting: Internal Medicine

## 2012-02-15 ENCOUNTER — Other Ambulatory Visit: Payer: Self-pay

## 2012-02-15 ENCOUNTER — Observation Stay (HOSPITAL_COMMUNITY): Payer: Medicare Other

## 2012-02-15 DIAGNOSIS — R8281 Pyuria: Secondary | ICD-10-CM | POA: Diagnosis present

## 2012-02-15 DIAGNOSIS — M1710 Unilateral primary osteoarthritis, unspecified knee: Secondary | ICD-10-CM | POA: Diagnosis present

## 2012-02-15 DIAGNOSIS — M1990 Unspecified osteoarthritis, unspecified site: Secondary | ICD-10-CM | POA: Diagnosis present

## 2012-02-15 DIAGNOSIS — M479 Spondylosis, unspecified: Secondary | ICD-10-CM | POA: Diagnosis present

## 2012-02-15 DIAGNOSIS — M47812 Spondylosis without myelopathy or radiculopathy, cervical region: Secondary | ICD-10-CM | POA: Diagnosis present

## 2012-02-15 DIAGNOSIS — W19XXXA Unspecified fall, initial encounter: Secondary | ICD-10-CM

## 2012-02-15 DIAGNOSIS — M898X9 Other specified disorders of bone, unspecified site: Secondary | ICD-10-CM

## 2012-02-15 DIAGNOSIS — M899 Disorder of bone, unspecified: Principal | ICD-10-CM | POA: Diagnosis present

## 2012-02-15 DIAGNOSIS — I872 Venous insufficiency (chronic) (peripheral): Secondary | ICD-10-CM | POA: Diagnosis present

## 2012-02-15 DIAGNOSIS — J90 Pleural effusion, not elsewhere classified: Secondary | ICD-10-CM | POA: Diagnosis present

## 2012-02-15 DIAGNOSIS — I4891 Unspecified atrial fibrillation: Secondary | ICD-10-CM | POA: Diagnosis present

## 2012-02-15 DIAGNOSIS — N390 Urinary tract infection, site not specified: Secondary | ICD-10-CM | POA: Diagnosis present

## 2012-02-15 DIAGNOSIS — F0390 Unspecified dementia without behavioral disturbance: Secondary | ICD-10-CM

## 2012-02-15 DIAGNOSIS — I10 Essential (primary) hypertension: Secondary | ICD-10-CM | POA: Diagnosis present

## 2012-02-15 DIAGNOSIS — I739 Peripheral vascular disease, unspecified: Secondary | ICD-10-CM | POA: Diagnosis present

## 2012-02-15 DIAGNOSIS — Z66 Do not resuscitate: Secondary | ICD-10-CM | POA: Diagnosis present

## 2012-02-15 DIAGNOSIS — F411 Generalized anxiety disorder: Secondary | ICD-10-CM | POA: Diagnosis present

## 2012-02-15 DIAGNOSIS — R32 Unspecified urinary incontinence: Secondary | ICD-10-CM | POA: Diagnosis present

## 2012-02-15 LAB — BASIC METABOLIC PANEL
BUN: 32 mg/dL — ABNORMAL HIGH (ref 6–23)
Creatinine, Ser: 1.05 mg/dL (ref 0.50–1.10)
GFR calc Af Amer: 55 mL/min — ABNORMAL LOW (ref >90)
GFR calc non Af Amer: 47 mL/min — ABNORMAL LOW (ref >90)
Potassium: 4.3 mEq/L (ref 3.5–5.1)

## 2012-02-15 LAB — URINALYSIS, ROUTINE W REFLEX MICROSCOPIC
Bilirubin Urine: NEGATIVE
Ketones, ur: NEGATIVE mg/dL
Nitrite: NEGATIVE
Urobilinogen, UA: 0.2 mg/dL (ref 0.0–1.0)

## 2012-02-15 LAB — CBC
HCT: 41.4 % (ref 36.0–46.0)
MCHC: 32.4 g/dL (ref 30.0–36.0)
MCV: 85.2 fL (ref 78.0–100.0)
Platelets: 258 10*3/uL (ref 150–400)
RDW: 14.7 % (ref 11.5–15.5)

## 2012-02-15 MED ORDER — DEXTROSE 5 % IV SOLN
1.0000 g | Freq: Once | INTRAVENOUS | Status: AC
  Administered 2012-02-15: 1 g via INTRAVENOUS
  Filled 2012-02-15: qty 10

## 2012-02-15 MED ORDER — ACETAMINOPHEN 500 MG PO TABS
1000.0000 mg | ORAL_TABLET | Freq: Once | ORAL | Status: AC
  Administered 2012-02-15: 1000 mg via ORAL
  Filled 2012-02-15: qty 2

## 2012-02-15 MED ORDER — TRAMADOL HCL 50 MG PO TABS
50.0000 mg | ORAL_TABLET | Freq: Four times a day (QID) | ORAL | Status: AC
  Administered 2012-02-15 – 2012-02-16 (×2): 50 mg via ORAL
  Filled 2012-02-15 (×2): qty 1

## 2012-02-15 MED ORDER — SODIUM CHLORIDE 0.9 % IV BOLUS (SEPSIS)
500.0000 mL | Freq: Once | INTRAVENOUS | Status: AC
  Administered 2012-02-15: 500 mL via INTRAVENOUS

## 2012-02-15 MED ORDER — TRAMADOL HCL 50 MG PO TABS
50.0000 mg | ORAL_TABLET | Freq: Four times a day (QID) | ORAL | Status: DC | PRN
  Administered 2012-02-16 – 2012-02-17 (×3): 50 mg via ORAL
  Filled 2012-02-15 (×2): qty 1

## 2012-02-15 MED ORDER — SODIUM CHLORIDE 0.9 % IJ SOLN
3.0000 mL | Freq: Two times a day (BID) | INTRAMUSCULAR | Status: DC
  Administered 2012-02-16 (×2): 3 mL via INTRAVENOUS

## 2012-02-15 NOTE — ED Provider Notes (Signed)
History     CSN: [id number]  Arrival date & time 02/15/12  1627   First MD Initiated Contact with Patient 02/15/12 1642      Chief Complaint  Patient presents with  . Fall    (Consider location/radiation/quality/duration/timing/severity/associated sxs/prior treatment) The history is provided by the patient, the EMS personnel and a relative.  Pt seen and eval at 4:50 PM. 78 y/o F with PMH of early dementia, HTN, renal insufficiency, a-fib, and CVA presents to ED with c/c of fall that occurred just prior to transport. History difficult to obtain given pt mild baseline dementia, though she does have some recollection of events. Was apparently using the bathroom and fell when trying to get up. Reports hitting the back of her head in the fall, unknown LOC. Does take a blood thinner daily for a-fib. Pt also complains of pain to the right leg/knee as well as the lower back. Movement appears to make her pain worse, nothing makes it better. Tx PTA by EMS with full spinal immobilization.  Per son at bedside, pt has been acting more aggressive than usual in last couple of days. Has had recurrent pneumonia recently and he is concerned there may be an infection causing her behavior change. He reports she is almost at her baseline mental status at the time of examination.  Past Medical History  Diagnosis Date  . Renal insufficiency   . Hypertension   . A-fib   . Anxiety   . Depression   . Hypothyroidism   . Inguinal hernia     repaired 2011, right  . Osteoarthritis   . Cerebral vascular accident   . DJD (degenerative joint disease)   . Recurrent UTI   . C. difficile colitis     recurrent  . Dementia   . Anemia   . Cataract   . Asteatotic eczema   . Peripheral vascular disease   . Hyponatremia   . Malnutrition   . Sepsis   . TIA (transient ischemic attack)   . Urinary incontinence     Past Surgical History  Procedure Date  . Inguinal hernia repair   . Bladder surgery     prolapsed  bladder  . Cataract extraction     Family History  Problem Relation Age of Onset  . Coronary artery disease    . Hypertension    . Stroke    . Colon cancer Neg Hx     History  Substance Use Topics  . Smoking status: Former Smoker    Types: Cigarettes    Quit date: 10/17/1960  . Smokeless tobacco: Never Used  . Alcohol Use: No     Review of Systems  Unable to perform ROS: Dementia    Allergies  Penicillins  Home Medications   Current Outpatient Rx  Name Route Sig Dispense Refill  . ACETAMINOPHEN 500 MG PO TABS Oral Take 500 mg by mouth 2 (two) times daily.    . ALBUTEROL SULFATE (2.5 MG/3ML) 0.083% IN NEBU Nebulization Take 2.5 mg by nebulization every 4 (four) hours as needed. For shortness of breath.    . BESIFLOXACIN HCL 0.6 % OP SUSP Ophthalmic Apply 1 drop to eye 3 (three) times daily.     Marland Kitchen CITALOPRAM HYDROBROMIDE 20 MG PO TABS Oral Take 20 mg by mouth daily.      Marland Kitchen DICLOFENAC SODIUM 1 % TD GEL Topical Apply 1 application topically 2 (two) times daily as needed. For pain.    Marland Kitchen DILTIAZEM HCL ER 240  MG PO CP24 Oral Take 240 mg by mouth daily.      Marland Kitchen DOCUSATE SODIUM 100 MG PO CAPS Oral Take 100 mg by mouth 2 (two) times daily as needed. For constipation.    Marland Kitchen FERROUS SULFATE 325 (65 FE) MG PO TABS Oral Take 325 mg by mouth daily with breakfast.      . FLUTICASONE PROPIONATE 50 MCG/ACT NA SUSP Nasal Place 1 spray into the nose at bedtime.     . FUROSEMIDE 80 MG PO TABS Oral Take 80 mg by mouth 2 (two) times daily.    Marland Kitchen HYDROCODONE-ACETAMINOPHEN 5-500 MG PO TABS Oral Take 2 tablets by mouth every 6 (six) hours as needed. For pain.     Marland Kitchen KLOR-CON M20 20 MEQ PO TBCR Oral Take 1 tablet by mouth 2 (two) times daily.    Marland Kitchen LEVOTHYROXINE SODIUM 100 MCG PO TABS Oral Take 150 mcg by mouth daily.     Marland Kitchen LORAZEPAM 0.5 MG PO TABS Oral Take 0.5 mg by mouth every 6 (six) hours as needed. For anxiety.    Marland Kitchen LOTEPREDNOL ETABONATE 0.5 % OP SUSP Both Eyes Place 1 drop into both eyes 3  (three) times daily.    Marland Kitchen MIRTAZAPINE 15 MG PO TABS Oral Take 15 mg by mouth at bedtime.      Marland Kitchen NEPAFENAC 0.1 % OP SUSP Both Eyes Place 1 drop into both eyes 3 (three) times daily.     Marland Kitchen PROCEL PO PACK Oral Take 1 each by mouth 3 (three) times daily.     Marland Kitchen RIVAROXABAN 15 MG PO TABS Oral Take 15 mg by mouth daily.      Marland Kitchen SACCHAROMYCES BOULARDII 250 MG PO CAPS Oral Take 250 mg by mouth 2 (two) times daily.        BP 134/78  Pulse 83  Resp 15  SpO2 91%  Physical Exam Vital signs are reviewed and are normal. Nursing notes are reviewed. Patient is well-developed, well-nourished, in no acute distress, fully immobilized. Head is normocephalic and atraumatic. Bilateral external ears and tympanic membranes are normal. Oral mucosa are moist. Pupils are equal, round, and reactive to light. Conjunctivae are normal. Extraocular movements are intact. Neck is not further examined initially due to immobilization. Heart with irregularly irregular rhythm, rate within normal limits. No murmurs heard. Radial and dorsalis pedis pulses are 2+ bilaterally. Normal respiratory effort and excursion. Decreased air movement to the left lower lung field. No adventitious breath sounds are heard. Abdomen soft, nontender, nondistended. There is a 3 cm, easily reducible, nontender ventral hernia just superior to the umbilicus. Extremities are without edema or tenderness. Active and passive range of motion to bilateral arms and legs is performed without expression of pain by patient. Chronic deformity to bilateral knees, likely secondary to arthritis. This is confirmed by patient's family member. There is midline tenderness to palpation over the superior lumbar spine. There is no pain on palpation to the thoracic spine. There is no paraspinal tenderness and no palpable spasm. No deformity or step off is palpated. Patient is awake, alert, oriented to person and place but not time or events. Cranial nerves III through XII are  tested and are intact. Finger to nose is intact bilaterally. Bilateral grip strength 5 out of 5. Speech is clear. Skin is warm and dry. Chronic skin color change to bilateral distal lower extremities. No rash, lesion, abrasion, or ecchymosis is seen on initial examination. Patient appears somewhat agitated at times.  ED Course  Procedures (  including critical care time)  Labs Reviewed  CBC - Abnormal; Notable for the following:    WBC 11.6 (*)    All other components within normal limits  BASIC METABOLIC PANEL - Abnormal; Notable for the following:    BUN 32 (*)    GFR calc non Af Amer 47 (*)    GFR calc Af Amer 55 (*)    All other components within normal limits  URINALYSIS, ROUTINE W REFLEX MICROSCOPIC - Abnormal; Notable for the following:    APPearance CLOUDY (*)    Hgb urine dipstick TRACE (*)    Leukocytes, UA LARGE (*)    All other components within normal limits  URINE MICROSCOPIC-ADD ON - Abnormal; Notable for the following:    Squamous Epithelial / LPF FEW (*)    All other components within normal limits  TROPONIN I  URINE CULTURE   Dg Chest 2 View  02/15/2012  *RADIOLOGY REPORT*  Clinical Data: Status post fall.  Rule out infection  CHEST - 2 VIEW  Comparison: 10/23/2011  Findings: Cardiac silhouette is increased.  There is a large left pleural effusion, increased from previous exam.  The right lung is clear.  There are chronic bronchitic changes noted in the right lower lobe.  IMPRESSION:  1.  Increased in volume of large, chronic left pleural effusion.  Original Report Authenticated By: Rosealee Albee, M.D.   Dg Lumbar Spine Complete  02/15/2012  *RADIOLOGY REPORT*  Clinical Data: Fall.  Low back pain.  LUMBAR SPINE - COMPLETE 4+ VIEW  Comparison: 03/13/2011  Findings:  There is a curvature of the lumbar spine which is convex to the left.  The vertebral body heights are well preserved.  There is mild multilevel disc space narrowing and ventral spurring consistent with  degenerative disc disease.  No fracture or subluxation identified.  IMPRESSION:  1.  No fractures identified. 2.  Mild scoliosis and multilevel degenerative disc disease.  Original Report Authenticated By: Rosealee Albee, M.D.   Ct Head Wo Contrast  02/15/2012  *RADIOLOGY REPORT*  Clinical Data:  Status post fall  CT HEAD WITHOUT CONTRAST CT CERVICAL SPINE WITHOUT CONTRAST  Technique:  Multidetector CT imaging of the head and cervical spine was performed following the standard protocol without intravenous contrast.  Multiplanar CT image reconstructions of the cervical spine were also generated.  Comparison:   None  CT HEAD  Findings: There is diffuse patchy low density throughout the subcortical and periventricular white matter consistent with chronic small vessel ischemic change.  There is prominence of the sulci and ventricles consistent with brain atrophy.  Chronic lacunar infarct within the right centrum semiovale noted. Old left thalamic lacunar infarct is also noted.  The mastoid air cells are clear.  There is partial opacification of the sphenoid sinus.  The skull is intact.  IMPRESSION: 1.  Small vessel ischemic disease. 2.  No acute intracranial abnormalities. 3.  Opacification of the sphenoid sinus.  CT CERVICAL SPINE  Findings: Normal alignment of the cervical spine.  The vertebral body heights are well maintained.  Bilateral facet degenerative changes identified.  There is moderate multilevel degenerative disc disease with disc space narrowing and ventral endplate spurring.  Most severe at C5- C6.  Large left pleural effusion is noted.  No fractures or subluxations noted.  IMPRESSION:  1.  Cervical spondylosis.  2.  No acute findings.  Original Report Authenticated By: Rosealee Albee, M.D.   Ct Cervical Spine Wo Contrast  02/15/2012  *RADIOLOGY REPORT*  Clinical Data:  Status post fall  CT HEAD WITHOUT CONTRAST CT CERVICAL SPINE WITHOUT CONTRAST  Technique:  Multidetector CT imaging of the head  and cervical spine was performed following the standard protocol without intravenous contrast.  Multiplanar CT image reconstructions of the cervical spine were also generated.  Comparison:   None  CT HEAD  Findings: There is diffuse patchy low density throughout the subcortical and periventricular white matter consistent with chronic small vessel ischemic change.  There is prominence of the sulci and ventricles consistent with brain atrophy.  Chronic lacunar infarct within the right centrum semiovale noted. Old left thalamic lacunar infarct is also noted.  The mastoid air cells are clear.  There is partial opacification of the sphenoid sinus.  The skull is intact.  IMPRESSION: 1.  Small vessel ischemic disease. 2.  No acute intracranial abnormalities. 3.  Opacification of the sphenoid sinus.  CT CERVICAL SPINE  Findings: Normal alignment of the cervical spine.  The vertebral body heights are well maintained.  Bilateral facet degenerative changes identified.  There is moderate multilevel degenerative disc disease with disc space narrowing and ventral endplate spurring.  Most severe at C5- C6.  Large left pleural effusion is noted.  No fractures or subluxations noted.  IMPRESSION:  1.  Cervical spondylosis.  2.  No acute findings.  Original Report Authenticated By: Rosealee Albee, M.D.    Date: 02/15/2012  Rate: 85  Rhythm: atrial fibrillation  QRS Axis: normal  Intervals: normal  ST/T Wave abnormalities: normal  Conduction Disutrbances: none  Narrative Interpretation: sig artifact  Old EKG Reviewed: 10/23/2011, No significant changes noted     1. Fall   2. UTI (urinary tract infection)   3. Pleural effusion, left   4. Dementia       MDM     Medications given in ED: rocephin, acetaminophen, NS     Labs Reviewed: Urinalysis, c/w infection. BMP, with azotemia possible secondary to dehydration vs chronic renal insufficiency. Slight leukocytosis.     Imaging Reviewed: CT scan of  head and c-spine without any acute findings. CXR with worsening of chronic left pleural effusion. Lumbar spine with no acute findings.    Consultant Called: Internal medicine teaching service, for pt admission to hospital. They agreed to see pt in ED.    Disposition: Admit      Pt with mild dementia, fall with likely cause UTI. ?altered mental status. Worsening left pleural effusion. Given recurrent severe infections, difficulty in the past with abx treatment, recurrent pneumonia on right with decreasing lung space on left, admission to the hospital for continued treatment at this time is most appropriate.        Shaaron Adler, PA-C 02/16/12 0020

## 2012-02-15 NOTE — ED Notes (Signed)
Attempted to call report, RN unavailable and will call back for pt report. 

## 2012-02-15 NOTE — ED Notes (Signed)
Pts family member, daughter, Gavin Pound would like to be updated of any changes  [phone]

## 2012-02-15 NOTE — ED Notes (Addendum)
Per EMS - Patient is from [facility]. Patient has hx of AFib. Allergy to penicillin. BP 145/77 HR 88-100. Pulse Ox on 2L oxygen 95%. CBG 92.  Patient c/o of left shoulder and left hip pain and back pain. Patient placed on spine board and c-collar prior to transporting to ED.

## 2012-02-15 NOTE — ED Notes (Addendum)
Patient was in restroom at [facility] and fell trying to come out of the restroom. Patient c/o of back and left shoulder and left hip pain. Patient states she hit the back of her head when she fell. Patient placed on monitor and 2L oxygen with sats of 97%. Patient's son states the patient wears oxygen at night. EKG captured on arrival to [facility].

## 2012-02-15 NOTE — ED Notes (Signed)
Patient resting with family at bedside. Patient c/o of pain when touched and shen she moves in bed. Patient's son at bedside.

## 2012-02-15 NOTE — ED Notes (Signed)
Offered to reposition pt for comfort measures, pt refused and only pillow given for comfort. Will continue to monitor pt.

## 2012-02-15 NOTE — ED Notes (Signed)
Patient returned from CT and xray.

## 2012-02-15 NOTE — ED Notes (Signed)
Patient transported to CT 

## 2012-02-15 NOTE — ED Notes (Signed)
Patient resting with NAD at this time. Patient remains in c collar and patient's son at bedside.

## 2012-02-15 NOTE — H&P (Signed)
Hospital Admission Note Date: 02/15/2012  Patient name: [name] [name] Medical record number: [id number] Date of birth: [birth year] Age: 78 y.o. Gender: female PCP: Genella Mech, MD, MD  Medical Service: Internal Medicine Teaching [facility]  Attending physician:  Ulyess Mort   1st Contact:   Lorretta Harp   Pager:  [phone] 2nd Contact:   Johnette Abraham  Pager: [phone] After 5 pm or weekends: 1st Contact:      Pager: [phone] 2nd Contact:      Pager: [phone]  Chief Complaint: hip, knee, back, head pain after fall  History of Present Illness: Ms. Bittinger is a 78 yo female with PMHx significant for atrial fibrillation on Xarelto for anticoagulation, recurrent UTIs, recurrent C. Diff colitis, degenerative joint disease of bilateral knees and osteoarthritis who presents via EMS from Nursing Home with reports of falling  In the bathroom and hitting the back of her head.  The patient reports that she had urinated  In the commode and when she went to stand she became light-headed, loss her balance and fell backward hitting her head in the shower stall.  She denies visual disturbance, prior headache or loss of consciousness.  She was unattended at the time and is currently wheelchair-bound.   Meds: Tylenol Albuterol bexifloxacin eye drops Citalopram 20 mg qd voltaren gel Colace Ferrous sulfate flonase Lasix 80 mg bid Vicodin 5/500 (2 tabs) q6h prn pain Klor-Con 20 mEq bid Levothyroxine 150 mcg qd Ativan 0.5 mg q6h prn anxiety Loteprednol eye drops ProCel pack tid rivaroxaban (Xarelto) 15 mg qd florastor 250 mg bid   Allergies: Allergies as of 02/15/2012 - Review Complete 02/15/2012  Allergen Reaction Noted  . Penicillins     Past Medical History  Diagnosis Date  . Renal insufficiency   . Hypertension   . A-fib   . Anxiety   . Depression   . Hypothyroidism   . Inguinal hernia     repaired 2011, right  . Osteoarthritis   . Cerebral vascular accident   .  DJD (degenerative joint disease)   . Recurrent UTI   . C. difficile colitis     recurrent  . Dementia   . Anemia   . Cataract   . Asteatotic eczema   . Peripheral vascular disease   . Hyponatremia   . Malnutrition   . Sepsis   . TIA (transient ischemic attack)   . Urinary incontinence    Past Surgical History  Procedure Date  . Inguinal hernia repair   . Bladder surgery     prolapsed bladder  . Cataract extraction    Family History  Problem Relation Age of Onset  . Coronary artery disease    . Hypertension    . Stroke    . Colon cancer Neg Hx    History   Social History  . Marital Status: Divorced    Spouse Name: N/A    Number of Children: N/A  . Years of Education: N/A   Occupational History  . Not on file.   Social History Main Topics  . Smoking status: Former Smoker    Types: Cigarettes    Quit date: 10/17/1960  . Smokeless tobacco: Never Used  . Alcohol Use: No  . Drug Use: No  . Sexually Active: Not on file   Other Topics Concern  . Not on file   Social History Narrative   Nursing home resident - supportive family    Review of Systems: Constitutional:  Denies fever, chills  HEENT: Endorses neck  and posterior head pain, Denies visual changes  Respiratory: Denies SOB, DOE, cough, chest tightness, and wheezing.  Cardiovascular: Denies chest pain, palpitations and leg swelling.  Gastrointestinal: Denies nausea, vomiting, abdominal pain, diarrhea, constipation  Genitourinary: Endorses urinary burning, urinary incontinence  Musculoskeletal: Endorses myalgias, back pain, knee pain, leg pain  Skin: Denies bruises  Neurological: Endorse light-headness upon rising from toilet, Denies dizziness, loss of consciousness, seizures, syncope      Physical Exam: Blood pressure 161/103, pulse 98, resp. rate 18, SpO2 96.00%. General: elderly cachetic appearing white female, in no acute distress; son at bedside Head: Normocephalic, atraumatic. Eyes: PERRLA,  EOMI, No signs of anemia or jaundice. Throat: edentulous, Oropharynx nonerythematous, no exudate appreciated.  Neck: supple, no masses, no carotid Bruits, bounding right jugular pulsations Lungs: Normal respiratory effort. Shallow movement of air although clear to auscultation bilaterally from apices to bases without crackles or wheezes appreciated. Heart: normal rate, regular rhythm, normal S1 and S2, no gallop, murmur, or rubs appreciated. Abdomen: BS normoactive. Soft, Nondistended, non-tender. No masses or organomegaly appreciated. Extremities: No pretibial edema, distal pulses intact, bilateral hallux lateral deviation Neurologic: grossly non-focal, alert and oriented x2 (self and hospital), appropriate and cooperative throughout examination.    Lab results: Basic Metabolic Panel:  Basename 02/15/12 1710  NA 136  K 4.3  CL 101  CO2 27  GLUCOSE 99  BUN 32*  CREATININE 1.05  CALCIUM 9.8  MG --  PHOS --    CBC:  Basename 02/15/12 1710  WBC 11.6*  NEUTROABS --  HGB 13.4  HCT 41.4  MCV 85.2  PLT 258   Cardiac Enzymes:  Basename 02/15/12 1730  CKTOTAL --  CKMB --  CKMBINDEX --  TROPONINI <0.30    Urinalysis:  Basename 02/15/12 1833  COLORURINE YELLOW  LABSPEC 1.015  PHURINE 6.0  GLUCOSEU NEGATIVE  HGBUR TRACE*  BILIRUBINUR NEGATIVE  KETONESUR NEGATIVE  PROTEINUR NEGATIVE  UROBILINOGEN 0.2  NITRITE NEGATIVE  LEUKOCYTESUR LARGE*     Imaging results:  Dg Chest 2 View  02/15/2012  *RADIOLOGY REPORT*  Clinical Data: Status post fall.  Rule out infection  CHEST - 2 VIEW  Comparison: 10/23/2011  Findings: Cardiac silhouette is increased.  There is a large left pleural effusion, increased from previous exam.  The right lung is clear.  There are chronic bronchitic changes noted in the right lower lobe.  IMPRESSION:  1.  Increased in volume of large, chronic left pleural effusion.  Original Report Authenticated By: Rosealee Albee, M.D.   Dg Lumbar Spine  Complete  02/15/2012  *RADIOLOGY REPORT*  Clinical Data: Fall.  Low back pain.  LUMBAR SPINE - COMPLETE 4+ VIEW  Comparison: 03/13/2011  Findings:  There is a curvature of the lumbar spine which is convex to the left.  The vertebral body heights are well preserved.  There is mild multilevel disc space narrowing and ventral spurring consistent with degenerative disc disease.  No fracture or subluxation identified.  IMPRESSION:  1.  No fractures identified. 2.  Mild scoliosis and multilevel degenerative disc disease.  Original Report Authenticated By: Rosealee Albee, M.D.   Ct Head Wo Contrast  02/15/2012  *RADIOLOGY REPORT*  Clinical Data:  Status post fall  CT HEAD WITHOUT CONTRAST CT CERVICAL SPINE WITHOUT CONTRAST  Technique:  Multidetector CT imaging of the head and cervical spine was performed following the standard protocol without intravenous contrast.  Multiplanar CT image reconstructions of the cervical spine were also generated.  Comparison:   None  CT  HEAD  Findings: There is diffuse patchy low density throughout the subcortical and periventricular white matter consistent with chronic small vessel ischemic change.  There is prominence of the sulci and ventricles consistent with brain atrophy.  Chronic lacunar infarct within the right centrum semiovale noted. Old left thalamic lacunar infarct is also noted.  The mastoid air cells are clear.  There is partial opacification of the sphenoid sinus.  The skull is intact.  IMPRESSION: 1.  Small vessel ischemic disease. 2.  No acute intracranial abnormalities. 3.  Opacification of the sphenoid sinus.  CT CERVICAL SPINE  Findings: Normal alignment of the cervical spine.  The vertebral body heights are well maintained.  Bilateral facet degenerative changes identified.  There is moderate multilevel degenerative disc disease with disc space narrowing and ventral endplate spurring.  Most severe at C5- C6.  Large left pleural effusion is noted.  No fractures or  subluxations noted.  IMPRESSION:  1.  Cervical spondylosis.  2.  No acute findings.  Original Report Authenticated By: Rosealee Albee, M.D.   Ct Cervical Spine Wo Contrast  02/15/2012  *RADIOLOGY REPORT*  Clinical Data:  Status post fall  CT HEAD WITHOUT CONTRAST CT CERVICAL SPINE WITHOUT CONTRAST  Technique:  Multidetector CT imaging of the head and cervical spine was performed following the standard protocol without intravenous contrast.  Multiplanar CT image reconstructions of the cervical spine were also generated.  Comparison:   None  CT HEAD  Findings: There is diffuse patchy low density throughout the subcortical and periventricular white matter consistent with chronic small vessel ischemic change.  There is prominence of the sulci and ventricles consistent with brain atrophy.  Chronic lacunar infarct within the right centrum semiovale noted. Old left thalamic lacunar infarct is also noted.  The mastoid air cells are clear.  There is partial opacification of the sphenoid sinus.  The skull is intact.  IMPRESSION: 1.  Small vessel ischemic disease. 2.  No acute intracranial abnormalities. 3.  Opacification of the sphenoid sinus.  CT CERVICAL SPINE  Findings: Normal alignment of the cervical spine.  The vertebral body heights are well maintained.  Bilateral facet degenerative changes identified.  There is moderate multilevel degenerative disc disease with disc space narrowing and ventral endplate spurring.  Most severe at C5- C6.  Large left pleural effusion is noted.  No fractures or subluxations noted.  IMPRESSION:  1.  Cervical spondylosis.  2.  No acute findings.  Original Report Authenticated By: Rosealee Albee, M.D.    Other results: EKG: atrial fibrillation, rate 85 bpm.  Assessment & Plan by Problem: 78 yo wheelchair bound female with multiple medical problems admitted from nursing home with pain after mechanical fall.   #1 Bone pain after mechanical fall (leg, hip, back, head): secondary  to fall in clinical setting of osteoarthritis, cervical spondylosis, and degenerative knee and back joints X-Rays without fractures or hematoma, pain not managed with Tylenol PLAN -hip X-Rays bilaterally -Tramadol 50 mg q6h scheduled x2 then prn -consider home dose Vicodin although pt expresses anxiety over taking strong medicine than Tylenol   #2 HYPERTENSION: currently elevated at 161/103 pulse 93, likely secondary to pain PLAN -continue diltiazem   #3 Atrial fibrillation: rate controlled on diltiazem, anticoagulated with Xarelto PLAN -continue home regimen  #4 UTI (lower urinary tract infection): with large amount of pyuria and rare bacteria PLAN -treat with Ceftriaxone given that there is question of whether pt had altered mental status -check urine culture and narrow or discontinue antibiotics  accordingly  #5 Anxiety: fairly anxious on exam PLAN -continue home regimen of Ativan  #6 Disposition: Patient is currently at her baseline mentally although her son reports that she had "a glassy look" to her eyes immediately after this incident.  He is more or less satisfied with Ms. Lafonda Mosses care at [facility] but feels as though she was not sufficiently supervised and his concerns were not addressed appropriately from the nursing home staff. -Code Status: DNR, yellow sheet documentation on chart  #6 DVT ppx: on Xarelto        Signed: Mollye Guinta 02/15/2012, 11:05 PM

## 2012-02-16 DIAGNOSIS — R51 Headache: Secondary | ICD-10-CM

## 2012-02-16 DIAGNOSIS — W19XXXA Unspecified fall, initial encounter: Secondary | ICD-10-CM

## 2012-02-16 DIAGNOSIS — M79609 Pain in unspecified limb: Secondary | ICD-10-CM

## 2012-02-16 DIAGNOSIS — M549 Dorsalgia, unspecified: Secondary | ICD-10-CM

## 2012-02-16 DIAGNOSIS — M25559 Pain in unspecified hip: Secondary | ICD-10-CM

## 2012-02-16 LAB — CBC
HCT: 36.8 % (ref 36.0–46.0)
MCHC: 32.3 g/dL (ref 30.0–36.0)
MCV: 84 fL (ref 78.0–100.0)
Platelets: 215 10*3/uL (ref 150–400)
RDW: 14.6 % (ref 11.5–15.5)

## 2012-02-16 LAB — BASIC METABOLIC PANEL
BUN: 25 mg/dL — ABNORMAL HIGH (ref 6–23)
CO2: 28 mEq/L (ref 19–32)
Calcium: 9.6 mg/dL (ref 8.4–10.5)
Chloride: 105 mEq/L (ref 96–112)
Creatinine, Ser: 0.89 mg/dL (ref 0.50–1.10)
GFR calc Af Amer: 67 mL/min — ABNORMAL LOW (ref >90)

## 2012-02-16 LAB — MRSA PCR SCREENING: MRSA by PCR: POSITIVE — AB

## 2012-02-16 MED ORDER — DILTIAZEM HCL ER 240 MG PO CP24
240.0000 mg | ORAL_CAPSULE | Freq: Every day | ORAL | Status: DC
  Administered 2012-02-16 – 2012-02-17 (×2): 240 mg via ORAL
  Filled 2012-02-16 (×2): qty 1

## 2012-02-16 MED ORDER — CITALOPRAM HYDROBROMIDE 20 MG PO TABS
20.0000 mg | ORAL_TABLET | Freq: Every day | ORAL | Status: DC
  Administered 2012-02-16 – 2012-02-17 (×2): 20 mg via ORAL
  Filled 2012-02-16 (×2): qty 1

## 2012-02-16 MED ORDER — FERROUS SULFATE 325 (65 FE) MG PO TABS
325.0000 mg | ORAL_TABLET | Freq: Every day | ORAL | Status: DC
  Administered 2012-02-16 – 2012-02-17 (×2): 325 mg via ORAL
  Filled 2012-02-16 (×3): qty 1

## 2012-02-16 MED ORDER — MIRTAZAPINE 15 MG PO TABS
15.0000 mg | ORAL_TABLET | Freq: Every day | ORAL | Status: DC
  Administered 2012-02-16 (×2): 15 mg via ORAL
  Filled 2012-02-16 (×3): qty 1

## 2012-02-16 MED ORDER — LORAZEPAM 0.5 MG PO TABS: 0.5000 mg | ORAL_TABLET | Freq: Four times a day (QID) | ORAL | Status: DC | PRN

## 2012-02-16 MED ORDER — PROCEL PO PACK
1.0000 | PACK | Freq: Three times a day (TID) | ORAL | Status: DC
Start: 2012-02-16 — End: 2012-02-16

## 2012-02-16 MED ORDER — OFLOXACIN 0.3 % OP SOLN
1.0000 [drp] | Freq: Three times a day (TID) | OPHTHALMIC | Status: DC
  Administered 2012-02-16 – 2012-02-17 (×5): 1 [drp] via OPHTHALMIC
  Filled 2012-02-16: qty 5

## 2012-02-16 MED ORDER — RIVAROXABAN 15 MG PO TABS
15.0000 mg | ORAL_TABLET | Freq: Every day | ORAL | Status: DC
  Administered 2012-02-16: 15 mg via ORAL
  Filled 2012-02-16 (×2): qty 1

## 2012-02-16 MED ORDER — DOCUSATE SODIUM 100 MG PO CAPS
100.0000 mg | ORAL_CAPSULE | Freq: Two times a day (BID) | ORAL | Status: DC | PRN
  Administered 2012-02-16 – 2012-02-17 (×2): 100 mg via ORAL
  Filled 2012-02-16 (×3): qty 1

## 2012-02-16 MED ORDER — CEFTRIAXONE SODIUM 1 G IJ SOLR
1.0000 g | INTRAMUSCULAR | Status: DC
  Administered 2012-02-16 – 2012-02-17 (×2): 1 g via INTRAVENOUS
  Filled 2012-02-16 (×3): qty 10

## 2012-02-16 MED ORDER — NEPAFENAC 0.1 % OP SUSP
1.0000 [drp] | Freq: Three times a day (TID) | OPHTHALMIC | Status: DC
  Administered 2012-02-16 – 2012-02-17 (×4): 1 [drp] via OPHTHALMIC
  Filled 2012-02-16 (×2): qty 3

## 2012-02-16 MED ORDER — FUROSEMIDE 80 MG PO TABS
80.0000 mg | ORAL_TABLET | Freq: Two times a day (BID) | ORAL | Status: DC
  Administered 2012-02-16 – 2012-02-17 (×3): 80 mg via ORAL
  Filled 2012-02-16 (×5): qty 1

## 2012-02-16 MED ORDER — CHLORHEXIDINE GLUCONATE CLOTH 2 % EX PADS
6.0000 | MEDICATED_PAD | Freq: Every day | CUTANEOUS | Status: DC
  Administered 2012-02-17: 6 via TOPICAL

## 2012-02-16 MED ORDER — BESIFLOXACIN HCL 0.6 % OP SUSP: 1.0000 [drp] | Freq: Three times a day (TID) | OPHTHALMIC | Status: DC

## 2012-02-16 MED ORDER — ALBUTEROL SULFATE (5 MG/ML) 0.5% IN NEBU: 2.5000 mg | INHALATION_SOLUTION | RESPIRATORY_TRACT | Status: DC | PRN

## 2012-02-16 MED ORDER — ACETAMINOPHEN 500 MG PO TABS
500.0000 mg | ORAL_TABLET | Freq: Two times a day (BID) | ORAL | Status: DC
  Administered 2012-02-16 – 2012-02-17 (×4): 500 mg via ORAL
  Filled 2012-02-16 (×5): qty 1

## 2012-02-16 MED ORDER — LEVOTHYROXINE SODIUM 150 MCG PO TABS
150.0000 ug | ORAL_TABLET | Freq: Every day | ORAL | Status: DC
  Administered 2012-02-16 – 2012-02-17 (×2): 150 ug via ORAL
  Filled 2012-02-16 (×3): qty 1

## 2012-02-16 MED ORDER — POTASSIUM CHLORIDE CRYS ER 20 MEQ PO TBCR
20.0000 meq | EXTENDED_RELEASE_TABLET | Freq: Two times a day (BID) | ORAL | Status: DC
  Administered 2012-02-16 – 2012-02-17 (×4): 20 meq via ORAL
  Filled 2012-02-16 (×6): qty 1

## 2012-02-16 MED ORDER — MUPIROCIN 2 % EX OINT
1.0000 "application " | TOPICAL_OINTMENT | Freq: Two times a day (BID) | CUTANEOUS | Status: DC
  Administered 2012-02-16 – 2012-02-17 (×2): 1 via NASAL
  Filled 2012-02-16: qty 22

## 2012-02-16 MED ORDER — SACCHAROMYCES BOULARDII 250 MG PO CAPS
250.0000 mg | ORAL_CAPSULE | Freq: Two times a day (BID) | ORAL | Status: DC
  Administered 2012-02-16 – 2012-02-17 (×3): 250 mg via ORAL
  Filled 2012-02-16 (×4): qty 1

## 2012-02-16 MED ORDER — ENSURE COMPLETE PO LIQD
237.0000 mL | Freq: Two times a day (BID) | ORAL | Status: DC
  Administered 2012-02-16 – 2012-02-17 (×4): 237 mL via ORAL

## 2012-02-16 MED ORDER — PRO-STAT SUGAR FREE PO LIQD
30.0000 mL | Freq: Three times a day (TID) | ORAL | Status: DC
  Administered 2012-02-16 – 2012-02-17 (×6): 30 mL via ORAL
  Filled 2012-02-16 (×7): qty 30

## 2012-02-16 MED ORDER — LOTEPREDNOL ETABONATE 0.5 % OP SUSP
1.0000 [drp] | Freq: Three times a day (TID) | OPHTHALMIC | Status: DC
  Administered 2012-02-16 – 2012-02-17 (×5): 1 [drp] via OPHTHALMIC
  Filled 2012-02-16: qty 5

## 2012-02-16 MED ORDER — FLUTICASONE PROPIONATE 50 MCG/ACT NA SUSP
1.0000 | Freq: Every day | NASAL | Status: DC
  Administered 2012-02-16: 1 via NASAL
  Filled 2012-02-16: qty 16

## 2012-02-16 NOTE — ED Notes (Signed)
If need to contact family, pt son name is Guiselle Mian [phone] (cell phone).

## 2012-02-16 NOTE — Progress Notes (Signed)
Subjective:  Patient feels better. Her pain is minimal when I saw her. Afebrile. Answers questions. Patient is little anxious.  Objective: Vital signs in last 24 hours: Filed Vitals:   02/16/12 0050 02/16/12 0650 02/16/12 0656 02/16/12 1012  BP: 144/79 152/92 154/97 124/86  Pulse: 109 73  85  Temp: 96.8 F (36 C) 96.7 F (35.9 C)    TempSrc: Oral Axillary    Resp: 18 20  18   Height: 5\' 5"  (1.651 m)     Weight: 121 lb 11.1 oz (55.2 kg)     SpO2: 96% 99%  97%   Weight change:   Intake/Output Summary (Last 24 hours) at 02/16/12 1403 Last data filed at 02/16/12 1015  Gross per 24 hour  Intake    543 ml  Output      0 ml  Net    543 ml   Physical Exam:  General: in no acute distress Head: Normocephalic, atraumatic. Eyes: PERRLA, EOMI, No signs of anemia or jaundice. Throat: no exudate appreciated.  Neck: supple, no carotid Bruits Lungs: Normal respiratory effort. Clear to auscultation bilaterally from apices to bases without crackles or wheezes appreciated. Heart: Irregular rhythm, S1 and S2, no gallop, murmur, or rubs  Abdomen: BS normoactive. Soft, Nondistended, non-tender. No masses or organomegaly appreciated. Extremities: No pretibial edema, distal pulses intact, bilateral hallux lateral deviation Neurologic: grossly non-focal, alert and oriented x2 (self and hospital), appropriate and cooperative throughout examination.  Lab Results: Basic Metabolic Panel:  Lab 02/16/12 1610 02/15/12 1710  NA 140 136  K 4.2 4.3  CL 105 101  CO2 28 27  GLUCOSE 95 99  BUN 25* 32*  CREATININE 0.89 1.05  CALCIUM 9.6 9.8  MG -- --  PHOS -- --   Liver Function Tests: No results found for this basename: AST:2,ALT:2,ALKPHOS:2,BILITOT:2,PROT:2,ALBUMIN:2 in the last 168 hours No results found for this basename: LIPASE:2,AMYLASE:2 in the last 168 hours No results found for this basename: AMMONIA:2 in the last 168 hours CBC:  Lab 02/16/12 0532 02/15/12 1710  WBC 8.7 11.6*    NEUTROABS -- --  HGB 11.9* 13.4  HCT 36.8 41.4  MCV 84.0 85.2  PLT 215 258   Cardiac Enzymes:  Lab 02/15/12 1730  CKTOTAL --  CKMB --  CKMBINDEX --  TROPONINI <0.30    Lab 02/15/12 1833  COLORURINE YELLOW  LABSPEC 1.015  PHURINE 6.0  GLUCOSEU NEGATIVE  HGBUR TRACE*  BILIRUBINUR NEGATIVE  KETONESUR NEGATIVE  PROTEINUR NEGATIVE  UROBILINOGEN 0.2  NITRITE NEGATIVE  LEUKOCYTESUR LARGE*   Micro Results: Recent Results (from the past 240 hour(s))  MRSA PCR SCREENING     Status: Abnormal   Collection Time   02/16/12  1:15 AM      Component Value Range Status Comment   MRSA by PCR POSITIVE (*) NEGATIVE  Final    Studies/Results: Dg Chest 2 View  02/15/2012  *RADIOLOGY REPORT*  Clinical Data: Status post fall.  Rule out infection  CHEST - 2 VIEW  Comparison: 10/23/2011  Findings: Cardiac silhouette is increased.  There is a large left pleural effusion, increased from previous exam.  The right lung is clear.  There are chronic bronchitic changes noted in the right lower lobe.  IMPRESSION:  1.  Increased in volume of large, chronic left pleural effusion.  Original Report Authenticated By: Rosealee Albee, M.D.   Dg Lumbar Spine Complete  02/15/2012  *RADIOLOGY REPORT*  Clinical Data: Fall.  Low back pain.  LUMBAR SPINE - COMPLETE 4+ VIEW  Comparison: 03/13/2011  Findings:  There is a curvature of the lumbar spine which is convex to the left.  The vertebral body heights are well preserved.  There is mild multilevel disc space narrowing and ventral spurring consistent with degenerative disc disease.  No fracture or subluxation identified.  IMPRESSION:  1.  No fractures identified. 2.  Mild scoliosis and multilevel degenerative disc disease.  Original Report Authenticated By: Rosealee Albee, M.D.   Dg Hip Complete Left  02/16/2012  *RADIOLOGY REPORT*  Clinical Data: Post fall  LEFT HIP - COMPLETE 2+ VIEW  Comparison: Abdominal CT - 03/13/2011  Findings: Diffuse osteopenia without  definite fracture.  Mild degenerative change of the left hip.  No definite pelvic fracture. Vascular calcifications.  IMPRESSION: Diffuse osteopenia without definite fracture.  Original Report Authenticated By: Waynard Reeds, M.D.   Dg Hip Complete Right  02/16/2012  *RADIOLOGY REPORT*  Clinical Data: Post fall  RIGHT HIP - COMPLETE 2+ VIEW  Comparison: CT abdomen pelvis - 03/13/2011  Findings: Diffuse osteopenia without definite evidence of fracture no definite pelvic fracture.  Mild degenerative change of the right hip.  Vascular calcifications.  IMPRESSION: Diffuse osteopenia without definite fracture.  Original Report Authenticated By: Waynard Reeds, M.D.   Ct Head Wo Contrast  02/15/2012  *RADIOLOGY REPORT*  Clinical Data:  Status post fall  CT HEAD WITHOUT CONTRAST CT CERVICAL SPINE WITHOUT CONTRAST  Technique:  Multidetector CT imaging of the head and cervical spine was performed following the standard protocol without intravenous contrast.  Multiplanar CT image reconstructions of the cervical spine were also generated.  Comparison:   None  CT HEAD  Findings: There is diffuse patchy low density throughout the subcortical and periventricular white matter consistent with chronic small vessel ischemic change.  There is prominence of the sulci and ventricles consistent with brain atrophy.  Chronic lacunar infarct within the right centrum semiovale noted. Old left thalamic lacunar infarct is also noted.  The mastoid air cells are clear.  There is partial opacification of the sphenoid sinus.  The skull is intact.  IMPRESSION: 1.  Small vessel ischemic disease. 2.  No acute intracranial abnormalities. 3.  Opacification of the sphenoid sinus.  CT CERVICAL SPINE  Findings: Normal alignment of the cervical spine.  The vertebral body heights are well maintained.  Bilateral facet degenerative changes identified.  There is moderate multilevel degenerative disc disease with disc space narrowing and ventral endplate  spurring.  Most severe at C5- C6.  Large left pleural effusion is noted.  No fractures or subluxations noted.  IMPRESSION:  1.  Cervical spondylosis.  2.  No acute findings.  Original Report Authenticated By: Rosealee Albee, M.D.   Ct Cervical Spine Wo Contrast  02/15/2012  *RADIOLOGY REPORT*  Clinical Data:  Status post fall  CT HEAD WITHOUT CONTRAST CT CERVICAL SPINE WITHOUT CONTRAST  Technique:  Multidetector CT imaging of the head and cervical spine was performed following the standard protocol without intravenous contrast.  Multiplanar CT image reconstructions of the cervical spine were also generated.  Comparison:   None  CT HEAD  Findings: There is diffuse patchy low density throughout the subcortical and periventricular white matter consistent with chronic small vessel ischemic change.  There is prominence of the sulci and ventricles consistent with brain atrophy.  Chronic lacunar infarct within the right centrum semiovale noted. Old left thalamic lacunar infarct is also noted.  The mastoid air cells are clear.  There is partial opacification of the  sphenoid sinus.  The skull is intact.  IMPRESSION: 1.  Small vessel ischemic disease. 2.  No acute intracranial abnormalities. 3.  Opacification of the sphenoid sinus.  CT CERVICAL SPINE  Findings: Normal alignment of the cervical spine.  The vertebral body heights are well maintained.  Bilateral facet degenerative changes identified.  There is moderate multilevel degenerative disc disease with disc space narrowing and ventral endplate spurring.  Most severe at C5- C6.  Large left pleural effusion is noted.  No fractures or subluxations noted.  IMPRESSION:  1.  Cervical spondylosis.  2.  No acute findings.  Original Report Authenticated By: Rosealee Albee, M.D.  Medications:  Scheduled Meds:   . acetaminophen  1,000 mg Oral Once  . acetaminophen  500 mg Oral BID  . cefTRIAXone (ROCEPHIN)  IV  1 g Intravenous Once  . cefTRIAXone (ROCEPHIN)  IV  1 g  Intravenous Q24H  . citalopram  20 mg Oral Daily  . diltiazem  240 mg Oral Daily  . feeding supplement  237 mL Oral BID BM  . feeding supplement  30 mL Oral TID WC  . ferrous sulfate  325 mg Oral Q breakfast  . fluticasone  1 spray Each Nare QHS  . furosemide  80 mg Oral BID  . levothyroxine  150 mcg Oral Q0600  . loteprednol  1 drop Both Eyes TID  . mirtazapine  15 mg Oral QHS  . nepafenac  1 drop Both Eyes TID  . ofloxacin  1 drop Both Eyes TID  . potassium chloride SA  20 mEq Oral BID  . Rivaroxaban  15 mg Oral Daily  . saccharomyces boulardii  250 mg Oral BID  . sodium chloride  500 mL Intravenous Once  . sodium chloride  3 mL Intravenous Q12H  . traMADol  50 mg Oral Q6H  . DISCONTD: Besifloxacin HCl  1 drop Ophthalmic TID  . DISCONTD: ProCel  1 each Oral TID   Continuous Infusions:  PRN Meds:.albuterol, docusate sodium, LORazepam, traMADol  Assessment/Plan:   #1 Bone pain after mechanical fall (leg, hip, back, head): secondary to fall in clinical setting of osteoarthritis, cervical spondylosis, and degenerative knee and back joints.  X-Rays without fractures or hematoma in hip and lumbar spin. CT-head and cervical spin negative for acute abnormalities.  PLAN -Tramadol 50 mg q6h scheduled x2 then prn -consider home dose Vicodin although pt expresses anxiety over taking strong medicine than Tylenol   #2 HYPERTENSION: currently elevated at 161/103 pulse 93, likely secondary to pain  PLAN -continue diltiazem   #3 Atrial fibrillation: rate controlled on diltiazem, anticoagulated with Xarelto PLAN -continue diltizem  #4 UTI (lower urinary tract infection): with large amount of pyuria and rare bacteria  PLAN -continue IV Ceftriaxone -pending urine culture and narrow or discontinue antibiotics accordingly  #5 Anxiety: fairly anxious on exam PLAN -continue home regimen of Ativan  # Pleural effusion: chronic issue. Work up in 4/12 showed  transudative pattern. It was  thought to be secondary to poor oncotic pressure from decreased albumin and her deconditioning. It has been managed with lasix. Currently stalbe.  -will continue lasix 80 mg bid.   #6 Disposition: Patient is currently at her baseline mentally although her son reports that she had "a glassy look" to her eyes immediately after this incident.  He is more or less satisfied with Ms. Danh care at [facility] but feels as though she was not sufficiently supervised and his concerns were not addressed appropriately from the nursing  home staff.  --I discussed with her son who was in the room. Then her son discussed with her daughter on the phone. They decide that patient would likely to go back to her previous SNF. Will d/c to SNF tomorrow.   #Code Status: DNR, yellow sheet documentation on chart  #6 DVT ppx: on Xarelto    LOS: 1 day   Lorretta Harp 02/16/2012, 2:03 PM

## 2012-02-16 NOTE — Discharge Summary (Signed)
Patient Name:  [name] [name]  MRN: [id number]  PCP: Genella Mech, MD, MD  DOB:  12/05/1923   CSN: [id number]     Date of Admission:  02/15/2012  Date of Discharge:  02/17/2012      Attending Physician: Dr. Ulyess Mort, MD        DISCHARGE DIAGNOSES: 1. Principal Problem: 2.  *Bone pain after fall (leg, hip, back, head) 3. Active Problems: 4.  ANXIETY 5.  HYPERTENSION 6.  Atrial fibrillation 7.  VENOUS INSUFFICIENCY, MILD 8.  OSTEOARTHRITIS 9.  DEGENERATIVE JOINT DISEASE, KNEES, BILATERAL 10.  URINARY INCONTINENCE 11.  Cervical spondylosis 12.  chronic pleural effusion, left 13.  Degenerative joint disease of spine 14.  Pyuria 15.    DISPOSITION AND FOLLOW-UP: [name] [name] is to follow-up with the listed providers as detailed below. At her visit, please address following issues:  1. Make sure her bone pain (leg, hip, back, head) has resolved. If not, particularly in hip, she may need  an another X-ray.  2. Make sure that patient does not have symptoms for  UTI.  Follow-up Information    Follow up with Genella Mech, MD. (at 3:15 pm ON 03/05/12)    Contact information:   [address] [place] [address] [phone]         Discharge Orders    Future Appointments: Provider: Department: Dept Phone: Center:   03/05/2012 3:15 PM Judie Bonus, MD [facility] [phone] [facility]      DISCHARGE MEDICATIONS: Medication List  As of 02/17/2012 12:11 PM   ASK your doctor about these medications         acetaminophen 500 MG tablet   Commonly known as: TYLENOL   Take 500 mg by mouth 2 (two) times daily.      albuterol (2.5 MG/3ML) 0.083% nebulizer solution   Commonly known as: PROVENTIL   Take 2.5 mg by nebulization every 4 (four) hours as needed. For shortness of breath.      BESIVANCE 0.6 % Susp   Generic drug: Besifloxacin HCl   Apply 1 drop to eye 3 (three) times daily.      citalopram 20 MG tablet   Commonly known as: CELEXA   Take 20 mg by mouth daily.      diltiazem 240 MG 24 hr capsule   Commonly known as: DILACOR XR   Take 240 mg by mouth daily.      docusate sodium 100 MG capsule   Commonly known as: COLACE   Take 100 mg by mouth 2 (two) times daily as needed. For constipation.      ferrous sulfate 325 (65 FE) MG tablet   Take 325 mg by mouth daily with breakfast.      fluticasone 50 MCG/ACT nasal spray   Commonly known as: FLONASE   Place 1 spray into the nose at bedtime.      furosemide 80 MG tablet   Commonly known as: LASIX   Take 80 mg by mouth 2 (two) times daily.      HYDROcodone-acetaminophen 5-500 MG per tablet   Commonly known as: VICODIN   Take 2 tablets by mouth every 6 (six) hours as needed. For pain.      KLOR-CON M20 20 MEQ tablet   Generic drug: potassium chloride SA   Take 1 tablet by mouth 2 (two) times daily.      levothyroxine 100 MCG tablet   Commonly known as:  SYNTHROID, LEVOTHROID   Take 150 mcg by mouth daily.      LORazepam 0.5 MG tablet   Commonly known as: ATIVAN   Take 0.5 mg by mouth every 6 (six) hours as needed. For anxiety.      loteprednol 0.5 % ophthalmic suspension   Commonly known as: LOTEMAX   Place 1 drop into both eyes 3 (three) times daily.      mirtazapine 15 MG tablet   Commonly known as: REMERON   Take 15 mg by mouth at bedtime.      nepafenac 0.1 % ophthalmic suspension   Commonly known as: NEVANAC   Place 1 drop into both eyes 3 (three) times daily.      ProCel Pack   Take 1 each by mouth 3 (three) times daily.      Rivaroxaban 15 MG Tabs tablet   Commonly known as: XARELTO   Take 15 mg by mouth daily.      saccharomyces boulardii 250 MG capsule   Commonly known as: FLORASTOR   Take 250 mg by mouth 2 (two) times daily.      VOLTAREN 1 % Gel   Generic drug: diclofenac sodium   Apply 1 application topically 2 (two) times daily as needed. For pain.           CONSULTS: none     PROCEDURES  PERFORMED:  Dg Chest 2 View  02/15/2012  *RADIOLOGY REPORT*  Clinical Data: Status post fall.  Rule out infection  CHEST - 2 VIEW  Comparison: 10/23/2011  Findings: Cardiac silhouette is increased.  There is a large left pleural effusion, increased from previous exam.  The right lung is clear.  There are chronic bronchitic changes noted in the right lower lobe.  IMPRESSION:  1.  Increased in volume of large, chronic left pleural effusion.  Original Report Authenticated By: Rosealee Albee, M.D.   Dg Lumbar Spine Complete  02/15/2012  *RADIOLOGY REPORT*  Clinical Data: Fall.  Low back pain.  LUMBAR SPINE - COMPLETE 4+ VIEW  Comparison: 03/13/2011  Findings:  There is a curvature of the lumbar spine which is convex to the left.  The vertebral body heights are well preserved.  There is mild multilevel disc space narrowing and ventral spurring consistent with degenerative disc disease.  No fracture or subluxation identified.  IMPRESSION:  1.  No fractures identified. 2.  Mild scoliosis and multilevel degenerative disc disease.  Original Report Authenticated By: Rosealee Albee, M.D.   Dg Hip Complete Left  02/16/2012  *RADIOLOGY REPORT*  Clinical Data: Post fall  LEFT HIP - COMPLETE 2+ VIEW  Comparison: Abdominal CT - 03/13/2011  Findings: Diffuse osteopenia without definite fracture.  Mild degenerative change of the left hip.  No definite pelvic fracture. Vascular calcifications.  IMPRESSION: Diffuse osteopenia without definite fracture.  Original Report Authenticated By: Waynard Reeds, M.D.   Dg Hip Complete Right  02/16/2012  *RADIOLOGY REPORT*  Clinical Data: Post fall  RIGHT HIP - COMPLETE 2+ VIEW  Comparison: CT abdomen pelvis - 03/13/2011  Findings: Diffuse osteopenia without definite evidence of fracture no definite pelvic fracture.  Mild degenerative change of the right hip.  Vascular calcifications.  IMPRESSION: Diffuse osteopenia without definite fracture.  Original Report Authenticated By: Waynard Reeds, M.D.   Ct Head Wo Contrast  02/15/2012  *RADIOLOGY REPORT*  Clinical Data:  Status post fall  CT HEAD WITHOUT CONTRAST CT CERVICAL SPINE WITHOUT CONTRAST  Technique:  Multidetector CT imaging  of the head and cervical spine was performed following the standard protocol without intravenous contrast.  Multiplanar CT image reconstructions of the cervical spine were also generated.  Comparison:   None  CT HEAD  Findings: There is diffuse patchy low density throughout the subcortical and periventricular white matter consistent with chronic small vessel ischemic change.  There is prominence of the sulci and ventricles consistent with brain atrophy.  Chronic lacunar infarct within the right centrum semiovale noted. Old left thalamic lacunar infarct is also noted.  The mastoid air cells are clear.  There is partial opacification of the sphenoid sinus.  The skull is intact.  IMPRESSION: 1.  Small vessel ischemic disease. 2.  No acute intracranial abnormalities. 3.  Opacification of the sphenoid sinus.  CT CERVICAL SPINE  Findings: Normal alignment of the cervical spine.  The vertebral body heights are well maintained.  Bilateral facet degenerative changes identified.  There is moderate multilevel degenerative disc disease with disc space narrowing and ventral endplate spurring.  Most severe at C5- C6.  Large left pleural effusion is noted.  No fractures or subluxations noted.  IMPRESSION:  1.  Cervical spondylosis.  2.  No acute findings.  Original Report Authenticated By: Rosealee Albee, M.D.   Ct Cervical Spine Wo Contrast  02/15/2012  *RADIOLOGY REPORT*  Clinical Data:  Status post fall  CT HEAD WITHOUT CONTRAST CT CERVICAL SPINE WITHOUT CONTRAST  Technique:  Multidetector CT imaging of the head and cervical spine was performed following the standard protocol without intravenous contrast.  Multiplanar CT image reconstructions of the cervical spine were also generated.  Comparison:   None  CT HEAD  Findings:  There is diffuse patchy low density throughout the subcortical and periventricular white matter consistent with chronic small vessel ischemic change.  There is prominence of the sulci and ventricles consistent with brain atrophy.  Chronic lacunar infarct within the right centrum semiovale noted. Old left thalamic lacunar infarct is also noted.  The mastoid air cells are clear.  There is partial opacification of the sphenoid sinus.  The skull is intact.  IMPRESSION: 1.  Small vessel ischemic disease. 2.  No acute intracranial abnormalities. 3.  Opacification of the sphenoid sinus.  CT CERVICAL SPINE  Findings: Normal alignment of the cervical spine.  The vertebral body heights are well maintained.  Bilateral facet degenerative changes identified.  There is moderate multilevel degenerative disc disease with disc space narrowing and ventral endplate spurring.  Most severe at C5- C6.  Large left pleural effusion is noted.  No fractures or subluxations noted.  IMPRESSION:  1.  Cervical spondylosis.  2.  No acute findings.  Original Report Authenticated By: Rosealee Albee, M.D.       ADMISSION DATA:  History of Present Illness: Ms. Hodges is a 78 yo female with PMHx significant for atrial fibrillation on Xarelto for anticoagulation, recurrent UTIs, recurrent C. Diff colitis, degenerative joint disease of bilateral knees and osteoarthritis who presents via EMS from Nursing Home with reports of falling  In the bathroom and hitting the back of her head.  The patient reports that she had urinated  In the commode and when she went to stand she became light-headed, loss her balance and fell backward hitting her head in the shower stall.  She denies visual disturbance, prior headache or loss of consciousness.  She was unattended at the time and is currently wheelchair-bound.   Physical Exam: Blood pressure 161/103, pulse 98, resp. rate 18, SpO2 96.00%. General: elderly  cachetic appearing white female, in no acute  distress; son at bedside Head: Normocephalic, atraumatic. Eyes: PERRLA, EOMI, No signs of anemia or jaundice. Throat: edentulous, Oropharynx nonerythematous, no exudate appreciated.  Neck: supple, no masses, no carotid Bruits, bounding right jugular pulsations Lungs: Normal respiratory effort. Shallow movement of air although clear to auscultation bilaterally from apices to bases without crackles or wheezes appreciated. Heart: normal rate, regular rhythm, normal S1 and S2, no gallop, murmur, or rubs appreciated. Abdomen: BS normoactive. Soft, Nondistended, non-tender. No masses or organomegaly appreciated. Extremities: No pretibial edema, distal pulses intact, bilateral hallux lateral deviation Neurologic: grossly non-focal, alert and oriented x2 (self and hospital), appropriate and cooperative throughout examination.  Lab results: Basic Metabolic Panel:  Basename  02/15/12 1710   NA  136   K  4.3   CL  101   CO2  27   GLUCOSE  99   BUN  32*   CREATININE  1.05   CALCIUM  9.8   MG  --   PHOS  --     CBC:  Basename  02/15/12 1710   WBC  11.6*   NEUTROABS  --   HGB  13.4   HCT  41.4   MCV  85.2   PLT  258    Cardiac Enzymes:  Basename  02/15/12 1730   CKTOTAL  --   CKMB  --   CKMBINDEX  --   TROPONINI  <0.30     Urinalysis:  Basename  02/15/12 1833   COLORURINE  YELLOW   LABSPEC  1.015   PHURINE  6.0   GLUCOSEU  NEGATIVE   HGBUR  TRACE*   BILIRUBINUR  NEGATIVE   KETONESUR  NEGATIVE   PROTEINUR  NEGATIVE   UROBILINOGEN  0.2   NITRITE  NEGATIVE   LEUKOCYTESUR  LARGE*     HOSPITAL COURSE:   #1 Bone pain after mechanical fall (leg, hip, back, head): secondary to fall in clinical setting of osteoarthritis, cervical spondylosis, and degenerative knee and back joints.  X-Rays without fractures or hematoma in hip and lumbar spin. CT-head and cervical spin negative for acute abnormalities. Patient's pain was treated with tramadol and tylenol. At discharge, her pain  is minimal. She will be followed up in clinic at 5/20.   #2 HYPERTENSION:  Bp was fluctuated at 118/90 to 161/103, likely due to pain. Treated with diltiazem.    #3 Atrial fibrillation: rate was well controlled on diltiazem, anticoagulated with Xarelto   #4 UTI (lower urinary tract infection): with large amount of pyuria and rare bacteria. Completed 3 days of IV Ceftriaxone in hospital.  #5 Anxiety: fairly anxious at admission. Continued home regimen of Ativan  # Pleural effusion: chronic issue. Work up in 4/12 showed  transudative pattern. It was thought to be secondary to poor oncotic pressure from decreased albumin and her deconditioning. It has been managed with lasix. Currently stable and asymptomatic. We continued lasix 80 mg bid. PCP may need to decide whether patient should be on long term lasix or some other choices, such as pleurodesis.  DISCHARGE DATA: Vital Signs: BP 118/70  Pulse 84  Temp(Src) 97.4 F (36.3 C) (Oral)  Resp 17  Ht 5\' 5"  (1.651 m)  Wt 120 lb 9.6 oz (54.704 kg)  BMI 20.07 kg/m2  SpO2 96%  Labs: Results for orders placed during the hospital encounter of 02/15/12 (from the past 24 hour(s))  BASIC METABOLIC PANEL     Status: Abnormal   Collection Time  02/17/12  7:10 AM      Component Value Range   Sodium 139  135 - 145 (mEq/L)   Potassium 3.7  3.5 - 5.1 (mEq/L)   Chloride 101  96 - 112 (mEq/L)   CO2 27  19 - 32 (mEq/L)   Glucose, Bld 91  70 - 99 (mg/dL)   BUN 28 (*) 6 - 23 (mg/dL)   Creatinine, Ser 4.54  0.50 - 1.10 (mg/dL)   Calcium 9.6  8.4 - 09.8 (mg/dL)   GFR calc non Af Amer 57 (*) >90 (mL/min)   GFR calc Af Amer 67 (*) >90 (mL/min)     Time Spent on Discharge:  Signed: Lorretta Harp, MD PGY I, Internal Medicine Resident

## 2012-02-16 NOTE — Progress Notes (Signed)
INITIAL ADULT NUTRITION ASSESSMENT Date: 02/16/2012   Time: 10:44 AM Reason for Assessment: Nutrition Risk  ASSESSMENT: Female 78 y.o.  Dx: Bone pain  Hx:  Past Medical History  Diagnosis Date  . Renal insufficiency   . Hypertension   . A-fib   . Anxiety   . Depression   . Hypothyroidism   . Inguinal hernia     repaired 2011, right  . Osteoarthritis   . Cerebral vascular accident   . DJD (degenerative joint disease)   . Recurrent UTI   . C. difficile colitis     recurrent  . Dementia   . Anemia   . Cataract   . Asteatotic eczema   . Peripheral vascular disease   . Hyponatremia   . Malnutrition   . Sepsis   . TIA (transient ischemic attack)   . Urinary incontinence     Related Meds:     . acetaminophen  1,000 mg Oral Once  . acetaminophen  500 mg Oral BID  . cefTRIAXone (ROCEPHIN)  IV  1 g Intravenous Once  . cefTRIAXone (ROCEPHIN)  IV  1 g Intravenous Q24H  . citalopram  20 mg Oral Daily  . diltiazem  240 mg Oral Daily  . feeding supplement  30 mL Oral TID WC  . ferrous sulfate  325 mg Oral Q breakfast  . fluticasone  1 spray Each Nare QHS  . furosemide  80 mg Oral BID  . levothyroxine  150 mcg Oral Q0600  . loteprednol  1 drop Both Eyes TID  . mirtazapine  15 mg Oral QHS  . nepafenac  1 drop Both Eyes TID  . ofloxacin  1 drop Both Eyes TID  . potassium chloride SA  20 mEq Oral BID  . Rivaroxaban  15 mg Oral Daily  . saccharomyces boulardii  250 mg Oral BID  . sodium chloride  500 mL Intravenous Once  . sodium chloride  3 mL Intravenous Q12H  . traMADol  50 mg Oral Q6H  . DISCONTD: Besifloxacin HCl  1 drop Ophthalmic TID  . DISCONTD: ProCel  1 each Oral TID     Ht: 5\' 5"  (165.1 cm)  Wt: 121 lb 11.1 oz (55.2 kg) (bedscale)  Ideal Wt: 56.8 kg % Ideal Wt: 97%  Usual Wt:  Wt Readings from Last 10 Encounters:  02/16/12 121 lb 11.1 oz (55.2 kg)  10/24/11 119 lb 7.8 oz (54.2 kg)  09/20/11 111 lb 9.6 oz (50.621 kg)  12/02/10 107 lb 3.2 oz (48.626  kg)  07/15/10 112 lb 0.8 oz (50.826 kg)  05/20/10 107 lb 0.6 oz (48.552 kg)  01/21/10 114 lb 9.6 oz (51.982 kg)  12/03/09 116 lb 0.5 oz (52.631 kg)  06/03/09 115 lb 11.2 oz (52.481 kg)  05/12/09 116 lb (52.617 kg)    % Usual Wt: > 100%  Body mass index is 20.25 kg/(m^2). WNL  Food/Nutrition Related Hx: No nutrition hx was filled out in chart. Pt states that she thinks her appetite has been ok, some problems chewing (edentulous) now on D3 diet. Pt is dependant for feeding  Labs:  CMP     Component Value Date/Time   NA 140 02/16/2012 0532   K 4.2 02/16/2012 0532   CL 105 02/16/2012 0532   CO2 28 02/16/2012 0532   GLUCOSE 95 02/16/2012 0532   BUN 25* 02/16/2012 0532   CREATININE 0.89 02/16/2012 0532   CREATININE 2.84* 12/06/2010 1525   CALCIUM 9.6 02/16/2012 0532   PROT 6.3 07/15/2011  0930   ALBUMIN 2.7* 07/15/2011 0930   AST 17 07/15/2011 0930   ALT 11 07/15/2011 0930   ALKPHOS 63 07/15/2011 0930   BILITOT 0.3 07/15/2011 0930   GFRNONAA 57* 02/16/2012 0532   GFRAA 67* 02/16/2012 0532     Intake/Output Summary (Last 24 hours) at 02/16/12 1049 Last data filed at 02/16/12 1015  Gross per 24 hour  Intake    183 ml  Output      0 ml  Net    183 ml     Diet Order: Dysphagia 3 thin liquids  Supplements/Tube Feeding: 30 ml Pro-stat, TID with meals  IVF:    Estimated Nutritional Needs:   Kcal: 1300-1450  Protein: 55-65 gm  Fluid:  > 1.5 L Pt is a resident of a nursing home. No nursing home record available for review. Pt is at increased nutrition risk r/t dependency for feeding. Po intake of one meal 45%. Pt has Pro-stat ordered with meals. RD will add additional between meal supplements.  NUTRITION DIAGNOSIS: -Biting/Chewing (NI-1.2).  Status: Ongoing  RELATED TO: edentulous status  AS EVIDENCE BY: D3 diet order  MONITORING/EVALUATION(Goals): Goal: PO intake of meals and supplements will meet >90% of estimated nutrition needs Monitor: PO intake, weight, labs, I/O's  EDUCATION  NEEDS: -No education needs identified at this time  INTERVENTION: 1. Agree with diet and current supplements 2. Add Ensure Complete BID between meals  Dietitian [phone]  DOCUMENTATION CODES Per approved criteria  -Not Applicable    Clarene Duke MARIE 02/16/2012, 10:44 AM

## 2012-02-16 NOTE — ED Provider Notes (Signed)
Medical screening examination/treatment/procedure(s) were conducted as a shared visit with non-physician practitioner(s) and myself.  I personally evaluated the patient during the encounter.  Level V caveat for dementia..  Patient is frail with worsening left pleural effusion and urinary tract infection. Admit  Donnetta Hutching, MD 02/16/12 2251

## 2012-02-16 NOTE — H&P (Signed)
IM Attending  43 woman from nursing home.  Adm for fall while rising from toilet.  Not witnessed but she does not report chest pain or other CNS sx.  She is on several meds that could contribute: celexa, ativan, vicodin, lasix (80 bid).  Also on xarelto for afib.  Labs and V.S. OK.  EKG with afib only.  Troponins negative..  Has pyuria -- will treat. Has old unchanged left pleural effusion; record indicates a prior tap -- transudate.  Has had dyspnea admissions.  Not dyspneic this time.  Unclear diagnosis for effusion. No evidence for serious injury from this fall.  If she can eat and if family believes she is unchanged, may return to SNF.

## 2012-02-16 NOTE — Progress Notes (Signed)
Pt admission history incomplete.  Pt only oriented to self and unable to answer questions.  Will retry admission history when son is present.

## 2012-02-16 NOTE — Progress Notes (Signed)
PHARMACY - ANTIBIOTIC CONSULT  Initial Consult Note  Pharmacy Consult for: Ceftriaxone  Indication: UTI   Patient Data:   Allergies: Allergies  Allergen Reactions  . Penicillins     REACTION: red face    Patient Measurements: Height: 5\' 5"  (165.1 cm) Weight: 121 lb 11.1 oz (55.2 kg) (bedscale) IBW/kg (Calculated) : 57   Vital Signs: Temp:  [96.8 F (36 C)] 96.8 F (36 C) (05/02 0050) Pulse Rate:  [80-109] 109  (05/02 0050) Resp:  [15-24] 18  (05/02 0050) BP: (118-161)/(72-103) 144/79 mmHg (05/02 0050) SpO2:  [91 %-98 %] 96 % (05/02 0050) Weight:  [121 lb 11.1 oz (55.2 kg)] 121 lb 11.1 oz (55.2 kg) (05/02 0050)  BMI: Estimated Body mass index is 20.25 kg/(m^2) as calculated from the following:   Height as of this encounter: 5\' 5" (1.651 m).   Weight as of this encounter: 121 lb 11.1 oz(55.2 kg).  Intake/Output from previous day: No intake or output data in the 24 hours ending 02/16/12 0117  Labs:  Basename 02/15/12 1710  WBC 11.6*  HGB 13.4  PLT 258  LABCREA --  CREATININE 1.05   Estimated Creatinine Clearance: 34.1 ml/min (by C-G formula based on Cr of 1.05). No results found for this basename: VANCOTROUGH:2,VANCOPEAK:2,VANCORANDOM:2,GENTTROUGH:2,GENTPEAK:2,GENTRANDOM:2,TOBRATROUGH:2,TOBRAPEAK:2,TOBRARND:2,AMIKACINPEAK:2,AMIKACINTROU:2,AMIKACIN:2, in the last 72 hours   Microbiology: No results found for this or any previous visit (from the past 720 hour(s)).  Medical History: Past Medical History  Diagnosis Date  . Renal insufficiency   . Hypertension   . A-fib   . Anxiety   . Depression   . Hypothyroidism   . Inguinal hernia     repaired 2011, right  . Osteoarthritis   . Cerebral vascular accident   . DJD (degenerative joint disease)   . Recurrent UTI   . C. difficile colitis     recurrent  . Dementia   . Anemia   . Cataract   . Asteatotic eczema   . Peripheral vascular disease   . Hyponatremia   . Malnutrition   . Sepsis   . TIA  (transient ischemic attack)   . Urinary incontinence     Scheduled Medications:     . acetaminophen  1,000 mg Oral Once  . acetaminophen  500 mg Oral BID  . cefTRIAXone (ROCEPHIN)  IV  1 g Intravenous Once  . cefTRIAXone (ROCEPHIN)  IV  1 g Intravenous Q24H  . citalopram  20 mg Oral Daily  . diltiazem  240 mg Oral Daily  . feeding supplement  30 mL Oral TID WC  . ferrous sulfate  325 mg Oral Q breakfast  . fluticasone  1 spray Each Nare QHS  . furosemide  80 mg Oral BID  . levothyroxine  150 mcg Oral Q0600  . loteprednol  1 drop Both Eyes TID  . mirtazapine  15 mg Oral QHS  . nepafenac  1 drop Both Eyes TID  . ofloxacin  1 drop Both Eyes TID  . potassium chloride SA  20 mEq Oral BID  . Rivaroxaban  15 mg Oral Daily  . saccharomyces boulardii  250 mg Oral BID  . sodium chloride  500 mL Intravenous Once  . sodium chloride  3 mL Intravenous Q12H  . traMADol  50 mg Oral Q6H  . DISCONTD: Besifloxacin HCl  1 drop Ophthalmic TID  . DISCONTD: ProCel  1 each Oral TID    Assessment:  78 y.o. female admitted after fall. Pharmacy consulted to manage ceftriaxone for UTI. Patient with penicillin allergy  documented (red face). Patient received ceftriaxone 1gm in ED, and with no adverse reaction to it per RN.   Plan:  1. Ceftriaxone 1gm IV Q24H.  2. Pharmacy will sign-off as no dose changes anticipated.   Dineen Kid Thad Ranger, PharmD 02/16/2012, 1:17 AM

## 2012-02-16 NOTE — Discharge Instructions (Signed)
1. Please follow-up in the clinic with Dr. Dorise Hiss, Lanora Manis  at 3:15 pm ON 03/05/12 [address], [place] [address], [phone].  2. Please take all medications as prescribed.  3. If you have worsening of your symptoms or new symptoms arise, please call the clinic ([phone]), or go to the ER immediately if symptoms are severe.    Fall Prevention, Elderly Falls are the leading cause of injuries, accidents, and accidental deaths in people over the age of 22. Falling is a real threat to your ability to live on your own. CAUSES   Poor eyesight or poor hearing can make you more likely to fall.   Illnesses and physical conditions can affect your strength and balance.   Poor lighting, throw rugs and pets in your home can make you more likely to trip or slip.   The side effects of some medicines can upset your balance and lead to falling. These include medicines for depression, sleep problems, high blood pressure, diabetes, and heart conditions.  PREVENTION  Be sure your home is as safe as possible. Here are some tips:  Wear shoes with non-skid soles (not house slippers).   Be sure your home and outside area are well lit.   Use night lights throughout your house, including hallways and stairways.   Remove clutter and clean up spills on floors and walkways.   Remove throw rugs or fasten them to the floor with carpet tape. Tack down carpet edges.   Do not place electrical cords across pathways.   Install grab bars in your bathtub, shower, and toilet area. Towel bars should not be used as a grab bar.   Install handrails on both sides of stairways.   Do not climb on stools or stepladders. Get someone else to help with jobs that require climbing.   Do not wax your floors at all, or use a non-skid wax.   Repair uneven or unsafe sidewalks, walkways or stairs.   Keep frequently used items within reach.   Be aware of pets so you do not trip.  Get regular  check-ups from your doctor, and take good care of yourself:  Have your eyes checked every year for vision changes, cataracts, glaucoma, and other eye problems. Wear eyeglasses as directed.   Have your hearing checked every 2 years, or anytime you or others think that you cannot hear well. Use hearing aids as directed.   See your caregiver if you have foot pain or corns. Sore feet can contribute to falls.   Let your caregiver know if a medicine is making you feel dizzy or making you lose your balance.   Use a cane, walker, or wheelchair as directed. Use walker or wheelchair brakes when getting in and out.   When you get up from bed, sit on the side of the bed for 1 to 2 minutes before you stand up. This will give your blood pressure time to adjust, and you will feel less dizzy.   If you need to go to the bathroom often, consider using a bedside commode.  Keep your body in good shape:  Get regular exercise, especially walking.   Do exercises to strengthen the muscles you use for walking and lifting.   Do not smoke.   Minimize use of alcohol.  SEEK IMMEDIATE MEDICAL CARE IF:   You feel dizzy, weak, or unsteady on your feet.   You feel confused.   You fall.  Document Released: 10/03/2005 Document Revised:  09/22/2011 Document Reviewed: 03/30/2007 [facility] Patient Information 2012 [place], [place].

## 2012-02-16 NOTE — ED Notes (Signed)
Second attempt to call report, RN unavailable and to call back for pt report. Informed staff charge Rn should call back if primary RN remains unavailable. Will continue to monitor pt.

## 2012-02-17 DIAGNOSIS — M899 Disorder of bone, unspecified: Principal | ICD-10-CM

## 2012-02-17 LAB — BASIC METABOLIC PANEL
BUN: 28 mg/dL — ABNORMAL HIGH (ref 6–23)
Calcium: 9.6 mg/dL (ref 8.4–10.5)
Creatinine, Ser: 0.89 mg/dL (ref 0.50–1.10)
GFR calc Af Amer: 67 mL/min — ABNORMAL LOW (ref >90)
GFR calc non Af Amer: 57 mL/min — ABNORMAL LOW (ref >90)
Glucose, Bld: 91 mg/dL (ref 70–99)
Potassium: 3.7 mEq/L (ref 3.5–5.1)

## 2012-02-17 MED ORDER — RIVAROXABAN 15 MG PO TABS
15.0000 mg | ORAL_TABLET | Freq: Every day | ORAL | Status: DC
  Administered 2012-02-17: 15 mg via ORAL
  Filled 2012-02-17 (×2): qty 1

## 2012-02-17 MED ORDER — HYDROCODONE-ACETAMINOPHEN 5-500 MG PO TABS: 2.0000 | ORAL_TABLET | Freq: Four times a day (QID) | ORAL | Status: AC | PRN

## 2012-02-17 MED ORDER — BIOTENE DRY MOUTH MT LIQD
15.0000 mL | Freq: Two times a day (BID) | OROMUCOSAL | Status: DC
  Administered 2012-02-17: 15 mL via OROMUCOSAL

## 2012-02-17 MED ORDER — DEXTROSE 5 % IV SOLN
1.0000 g | Freq: Once | INTRAVENOUS | Status: DC
  Filled 2012-02-17: qty 10

## 2012-02-17 MED ORDER — TRAMADOL HCL 50 MG PO TABS: 50.0000 mg | ORAL_TABLET | Freq: Four times a day (QID) | ORAL | Status: DC | PRN

## 2012-02-17 NOTE — Progress Notes (Signed)
02/17/12 0532 Patient said he noticed a raised area on his left forearm this am. He said it felt like a lump. Asked patient when he noticed this and he said "Just now". I told the patient that I would write a note in the chart to notify the MD in the am and would report it to the the oncoming nurse for follow-up.

## 2012-02-17 NOTE — Progress Notes (Signed)
Clinical Social Work Department BRIEF PSYCHOSOCIAL ASSESSMENT 02/17/2012  Patient:  [name] [name], [name] [name]     Account Number:  [id number]     Admit date:  02/15/2012  Clinical Social Worker:  Conley Simmonds  Date/Time:  02/17/2012 10:15 AM  Referred by:  Physician  Date Referred:  02/16/2012 Referred for  SNF Placement   Other Referral:   Interview type:  Family Other interview type:    PSYCHOSOCIAL DATA Living Status:  FACILITY Admitted from facility:  [name] Level of care:  Skilled Nursing Facility Primary support name:  Sueellen Kayes Primary support relationship to patient:  CHILD, ADULT Degree of support available:   Very strong    CURRENT CONCERNS Current Concerns  Other - See comment   Other Concerns:   Return to SNF    SOCIAL WORK ASSESSMENT / PLAN  Assessment/plan status:  Psychosocial Support/Ongoing Assessment of Needs Other assessment/ plan:   CSW spoke with pt son/POA-pt a resident of [facility] and will be returning. Per son family has a bed hold-CSW contacted SNF and left message regarding pt return today. CSW will initiate FL2 and d/c to SNF with [facility] transport-   Information/referral to community resources:   None at this time-    PATIENT'S/FAMILY'S RESPONSE TO PLAN OF CARE: Pt son very appreciative of contact-This CSW has worked with pt and family upon past admissions and the family express appreciation for continuity of care-  PT son had concerns regarding mothers fall at [facility] and CSW supported his advocacy with regards to pt as well as discussing concerns with facility and if need be the ombudsman-CSW followingStan Head, [phone]

## 2012-02-17 NOTE — Progress Notes (Signed)
02/17/12 0405 On call MD was called and notified that patient had a pause of 2.16 seconds around 0355 this am. She a previous missed beat around 0238 with the longest missed beat measured at 1.97 seconds. Patient was sleeping at the time and was asymptomatic and O2 cannula had slipped out from her nose. VS were taken on patient and documented in EPIC. Spoke with patient's MD with morning and notified him.

## 2012-02-17 NOTE — Progress Notes (Signed)
Most alert I have seen her.  Some answers are vague but she is cheerful and engaging.  Denies any pain or dysuria.  On antibiotics for presumed UTI.  Afrbrile.  All basic labs OK.  Agree with discharge today.

## 2012-02-17 NOTE — Progress Notes (Signed)
CSW facilitated pt d/c back to [facility] with [facility] transport, chart copy and FL2. CSW alerted pt son and daughter and provided pt dtr with resources for caregivers-No further needs at this time- Jodean Lima, [phone]

## 2012-02-17 NOTE — Progress Notes (Signed)
Pt D/C per ambulance with all belongings. Dressed pt and used extra peripad due to stress inc. Family is aware and will meet pt at [facility]. Marisa Cyphers RN

## 2012-02-24 ENCOUNTER — Emergency Department (HOSPITAL_COMMUNITY)
Admission: EM | Admit: 2012-02-24 | Discharge: 2012-02-25 | Disposition: A | Discharge status: Skilled Nursing Facility | Payer: Medicare Other | Attending: Emergency Medicine | Admitting: Emergency Medicine

## 2012-02-24 ENCOUNTER — Other Ambulatory Visit: Payer: Self-pay

## 2012-02-24 ENCOUNTER — Encounter (HOSPITAL_COMMUNITY): Payer: Self-pay | Admitting: Emergency Medicine

## 2012-02-24 DIAGNOSIS — I739 Peripheral vascular disease, unspecified: Secondary | ICD-10-CM | POA: Insufficient documentation

## 2012-02-24 DIAGNOSIS — M25559 Pain in unspecified hip: Secondary | ICD-10-CM | POA: Insufficient documentation

## 2012-02-24 DIAGNOSIS — E039 Hypothyroidism, unspecified: Secondary | ICD-10-CM | POA: Insufficient documentation

## 2012-02-24 DIAGNOSIS — R4182 Altered mental status, unspecified: Secondary | ICD-10-CM | POA: Insufficient documentation

## 2012-02-24 DIAGNOSIS — W07XXXA Fall from chair, initial encounter: Secondary | ICD-10-CM | POA: Insufficient documentation

## 2012-02-24 DIAGNOSIS — Z8673 Personal history of transient ischemic attack (TIA), and cerebral infarction without residual deficits: Secondary | ICD-10-CM | POA: Insufficient documentation

## 2012-02-24 DIAGNOSIS — S79919A Unspecified injury of unspecified hip, initial encounter: Secondary | ICD-10-CM | POA: Insufficient documentation

## 2012-02-24 DIAGNOSIS — S7000XA Contusion of unspecified hip, initial encounter: Secondary | ICD-10-CM | POA: Insufficient documentation

## 2012-02-24 DIAGNOSIS — F0390 Unspecified dementia without behavioral disturbance: Secondary | ICD-10-CM | POA: Insufficient documentation

## 2012-02-24 DIAGNOSIS — I4891 Unspecified atrial fibrillation: Secondary | ICD-10-CM | POA: Insufficient documentation

## 2012-02-24 DIAGNOSIS — M1990 Unspecified osteoarthritis, unspecified site: Secondary | ICD-10-CM | POA: Insufficient documentation

## 2012-02-24 DIAGNOSIS — S79929A Unspecified injury of unspecified thigh, initial encounter: Secondary | ICD-10-CM | POA: Insufficient documentation

## 2012-02-24 DIAGNOSIS — Y9210 Unspecified residential institution as the place of occurrence of the external cause: Secondary | ICD-10-CM | POA: Insufficient documentation

## 2012-02-24 DIAGNOSIS — I10 Essential (primary) hypertension: Secondary | ICD-10-CM | POA: Insufficient documentation

## 2012-02-24 DIAGNOSIS — F341 Dysthymic disorder: Secondary | ICD-10-CM | POA: Insufficient documentation

## 2012-02-24 DIAGNOSIS — Z79899 Other long term (current) drug therapy: Secondary | ICD-10-CM | POA: Insufficient documentation

## 2012-02-24 NOTE — ED Notes (Signed)
ZOX:[id number]<AV> Expected date:<BR> Expected time:11:34 PM<BR> Means of arrival:<BR> Comments:<BR> M211 - 72yoF Hip fx

## 2012-02-25 ENCOUNTER — Emergency Department (HOSPITAL_COMMUNITY): Payer: Medicare Other

## 2012-02-25 LAB — COMPREHENSIVE METABOLIC PANEL
ALT: 11 U/L (ref 0–35)
AST: 17 U/L (ref 0–37)
Albumin: 2.9 g/dL — ABNORMAL LOW (ref 3.5–5.2)
Alkaline Phosphatase: 86 U/L (ref 39–117)
Chloride: 101 mEq/L (ref 96–112)
Potassium: 4.4 mEq/L (ref 3.5–5.1)
Sodium: 139 mEq/L (ref 135–145)
Total Bilirubin: 0.2 mg/dL — ABNORMAL LOW (ref 0.3–1.2)
Total Protein: 6.3 g/dL (ref 6.0–8.3)

## 2012-02-25 LAB — URINALYSIS, ROUTINE W REFLEX MICROSCOPIC
Glucose, UA: NEGATIVE mg/dL
Hgb urine dipstick: NEGATIVE
Ketones, ur: NEGATIVE mg/dL
Protein, ur: NEGATIVE mg/dL
Urobilinogen, UA: 0.2 mg/dL (ref 0.0–1.0)

## 2012-02-25 LAB — DIFFERENTIAL
Basophils Relative: 1 % (ref 0–1)
Eosinophils Absolute: 0.4 10*3/uL (ref 0.0–0.7)
Eosinophils Relative: 6 % — ABNORMAL HIGH (ref 0–5)
Lymphs Abs: 0.9 10*3/uL (ref 0.7–4.0)
Monocytes Relative: 12 % (ref 3–12)
Neutrophils Relative %: 68 % (ref 43–77)

## 2012-02-25 LAB — CBC
Hemoglobin: 10.5 g/dL — ABNORMAL LOW (ref 12.0–15.0)
MCH: 26.7 pg (ref 26.0–34.0)
MCHC: 31.2 g/dL (ref 30.0–36.0)
MCV: 85.8 fL (ref 78.0–100.0)
Platelets: 289 10*3/uL (ref 150–400)

## 2012-02-25 MED ORDER — TRAMADOL HCL 50 MG PO TABS: 50.0000 mg | ORAL_TABLET | Freq: Once | ORAL | Status: DC

## 2012-02-25 MED ORDER — TRAMADOL HCL 50 MG PO TABS
ORAL_TABLET | ORAL | Status: AC
  Filled 2012-02-25: qty 1

## 2012-02-25 NOTE — ED Notes (Signed)
[facility] here to transport pt back to [facility]

## 2012-02-25 NOTE — Discharge Instructions (Signed)
Your x-ray and CAT scan did not show any broken bones in your hip today. There was some bruising around the hip which may explain your pain and soreness. Please use rest, ice and elevation to help with symptoms. Please followup with a primary care provider.   Contusion A contusion is a deep bruise. Contusions are the result of an injury that caused bleeding under the skin. The contusion may turn blue, purple, or yellow. Minor injuries will give you a painless contusion, but more severe contusions may stay painful and swollen for a few weeks.  CAUSES  A contusion is usually caused by a blow, trauma, or direct force to an area of the body. SYMPTOMS   Swelling and redness of the injured area.   Bruising of the injured area.   Tenderness and soreness of the injured area.   Pain.  DIAGNOSIS  The diagnosis can be made by taking a history and physical exam. An X-ray, CT scan, or MRI may be needed to determine if there were any associated injuries, such as fractures. TREATMENT  Specific treatment will depend on what area of the body was injured. In general, the best treatment for a contusion is resting, icing, elevating, and applying cold compresses to the injured area. Over-the-counter medicines may also be recommended for pain control. Ask your caregiver what the best treatment is for your contusion. HOME CARE INSTRUCTIONS   Put ice on the injured area.   Put ice in a plastic bag.   Place a towel between your skin and the bag.   Leave the ice on for 15 to 20 minutes, 3 to 4 times a day.   Only take over-the-counter or prescription medicines for pain, discomfort, or fever as directed by your caregiver. Your caregiver may recommend avoiding anti-inflammatory medicines (aspirin, ibuprofen, and naproxen) for 48 hours because these medicines may increase bruising.   Rest the injured area.   If possible, elevate the injured area to reduce swelling.  SEEK IMMEDIATE MEDICAL CARE IF:   You have  increased bruising or swelling.   You have pain that is getting worse.   Your swelling or pain is not relieved with medicines.  MAKE SURE YOU:   Understand these instructions.   Will watch your condition.   Will get help right away if you are not doing well or get worse.  Document Released: 07/13/2005 Document Revised: 09/22/2011 Document Reviewed: 08/08/2011 [facility] Patient Information 2012 [place], [place].     Bone Bruise  A bone bruise is a small hidden fracture of the bone. It typically occurs with bones located close to the surface of the skin.  SYMPTOMS  The pain lasts longer than a normal bruise.   The bruised area is difficult to use.   There may be discoloration or swelling of the bruised area.   When a bone bruise is found with injury to the anterior cruciate ligament (in the knee) there is often an increased:   Amount of fluid in the knee   Time the fluid in the knee lasts.   Number of days until you are walking normally and regaining the motion you had before the injury.   Number of days with pain from the injury.  DIAGNOSIS  It can only be seen on X-rays known as MRIs. This stands for magnetic resonance imaging. A regular X-ray taken of a bone bruise would appear to be normal. A bone bruise is a common injury in the knee and the heel bone (calcaneus). The  problems are similar to those produced by stress fractures, which are bone injuries caused by overuse. A bone bruise may also be a sign of other injuries. For example, bone bruises are commonly found where an anterior cruciate ligament (ACL) in the knee has been pulled away from the bone (ruptured). A ligament is a tough fibrous material that connects bones together to make our joints stable. Bruises of the bone last a lot longer than bruises of the muscle or tissues beneath the skin. Bone bruises can last from days to months and are often more severe and painful than other bruises. TREATMENT Because bone  bruises are sudden injuries you cannot often prevent them, other than by being extremely careful. Some things you can do to improve the condition are:  Apply ice to the sore area for 15 to 20 minutes, 3 to 4 times per day while awake for the first 2 days. Put the ice in a plastic bag, and place a towel between the bag of ice and your skin.   Keep your bruised area raised (elevated) when possible to lessen swelling.   For activity:   Use crutches when necessary; do not put weight on the injured leg until you are no longer tender.   You may walk on your affected part as the pain allows, or as instructed.   Start weight bearing gradually on the bruised part.   Continue to use crutches or a cane until you can stand without causing pain, or as instructed.   If a plaster splint was applied, wear the splint until you are seen for a follow-up examination. Rest it on nothing harder than a pillow the first 24 hours. Do not put weight on it. Do not get it wet. You may take it off to take a shower or bath.   If an air splint was applied, more air may be blown into or out of the splint as needed for comfort. You may take it off at night and to take a shower or bath.   Wiggle your toes in the splint several times per day if you are able.   You may have been given an elastic bandage to use with the plaster splint or alone. The splint is too tight if you have numbness, tingling or if your foot becomes cold and blue. Adjust the bandage to make it comfortable.   Only take over-the-counter or prescription medicines for pain, discomfort, or fever as directed by your caregiver.   Follow all instructions for follow up with your caregiver. This includes any orthopedic referrals, physical therapy, and rehabilitation. Any delay in obtaining necessary care could result in a delay or failure of the bones to heal.  SEEK MEDICAL CARE IF:   You have an increase in bruising, swelling, or pain.   You notice coldness  of your toes.   You do not get pain relief with medications.  SEEK IMMEDIATE MEDICAL CARE IF:   Your toes are numb or blue.   You have severe pain not controlled with medications.   If any of the problems that caused you to seek care are becoming worse.  Document Released: 12/24/2003 Document Revised: 09/22/2011 Document Reviewed: 05/07/2008 [facility] Patient Information 2012 [place], [place].

## 2012-02-25 NOTE — ED Provider Notes (Signed)
History     CSN: [id number]  Arrival date & time 02/24/12  2357   First MD Initiated Contact with Patient 02/25/12 0018      Chief Complaint  Patient presents with  . Hip Injury    Poss fx per X Ray at [facility]    HPI  The history is provided by the patient, nursing home and EMS report. Pt is a 78 y/o F with PMH of early dementia, HTN, renal insufficiency, a-fib, and CVA presents to ED with c/c of fall that occurred just prior to transport. History is limited given pt baseline dementia. Patient does state that she reports falling after getting up out of a chair but also states that she cannot remember everything that happened. According to nursing report patient fell yesterday. Patient did have outpatient x-rays performed earlier today concerning for possible fracture and right hip. Patient had been complaining of continue pains in the hip. Patient is DO NOT RESUSCITATE. Patient does have history of A. fib and takes blood thinner for this. No other injury was reported.     Past Medical History  Diagnosis Date  . Renal insufficiency   . Hypertension   . A-fib   . Anxiety   . Depression   . Hypothyroidism   . Inguinal hernia     repaired 2011, right  . Osteoarthritis   . Cerebral vascular accident   . DJD (degenerative joint disease)   . Recurrent UTI   . C. difficile colitis     recurrent  . Dementia   . Anemia   . Cataract   . Asteatotic eczema   . Peripheral vascular disease   . Hyponatremia   . Malnutrition   . Sepsis   . TIA (transient ischemic attack)   . Urinary incontinence     Past Surgical History  Procedure Date  . Inguinal hernia repair   . Bladder surgery     prolapsed bladder  . Cataract extraction     Family History  Problem Relation Age of Onset  . Coronary artery disease    . Hypertension    . Stroke    . Colon cancer Neg Hx     History  Substance Use Topics  . Smoking status: Former Smoker    Types: Cigarettes    Quit date: 10/17/1960  .  Smokeless tobacco: Never Used  . Alcohol Use: No    OB History    Grav Para Term Preterm Abortions TAB SAB Ect Mult Living                  Review of Systems  Unable to perform ROS Neurological: Negative for dizziness and headaches.    Allergies  Penicillins  Home Medications   Current Outpatient Rx  Name Route Sig Dispense Refill  . ACETAMINOPHEN 500 MG PO TABS Oral Take 500 mg by mouth 2 (two) times daily.    . ALBUTEROL SULFATE (2.5 MG/3ML) 0.083% IN NEBU Nebulization Take 2.5 mg by nebulization every 4 (four) hours as needed. For shortness of breath.    . BESIFLOXACIN HCL 0.6 % OP SUSP Ophthalmic Apply 1 drop to eye 3 (three) times daily.     Marland Kitchen CITALOPRAM HYDROBROMIDE 20 MG PO TABS Oral Take 20 mg by mouth daily.      Marland Kitchen DICLOFENAC SODIUM 1 % TD GEL Topical Apply 1 application topically 2 (two) times daily as needed. For pain.    Marland Kitchen DILTIAZEM HCL ER 240 MG PO CP24 Oral Take 240  mg by mouth daily.      Marland Kitchen DOCUSATE SODIUM 100 MG PO CAPS Oral Take 100 mg by mouth 2 (two) times daily as needed. For constipation.    Marland Kitchen FERROUS SULFATE 325 (65 FE) MG PO TABS Oral Take 325 mg by mouth daily with breakfast.      . FLUTICASONE PROPIONATE 50 MCG/ACT NA SUSP Nasal Place 1 spray into the nose at bedtime.     . FUROSEMIDE 80 MG PO TABS Oral Take 80 mg by mouth 2 (two) times daily.    Marland Kitchen HYDROCODONE-ACETAMINOPHEN 5-500 MG PO TABS Oral Take 2 tablets by mouth every 6 (six) hours as needed. For pain. 30 tablet 0  . KLOR-CON M20 20 MEQ PO TBCR Oral Take 1 tablet by mouth 2 (two) times daily.    Marland Kitchen LEVOTHYROXINE SODIUM 100 MCG PO TABS Oral Take 150 mcg by mouth daily.     Marland Kitchen LORAZEPAM 0.5 MG PO TABS Oral Take 0.5 mg by mouth every 6 (six) hours as needed. For anxiety.    Marland Kitchen LOTEPREDNOL ETABONATE 0.5 % OP SUSP Both Eyes Place 1 drop into both eyes 3 (three) times daily.    Marland Kitchen MIRTAZAPINE 15 MG PO TABS Oral Take 15 mg by mouth at bedtime.      Marland Kitchen NEPAFENAC 0.1 % OP SUSP Both Eyes Place 1 drop into both  eyes 3 (three) times daily.     Marland Kitchen PROCEL PO PACK Oral Take 1 each by mouth 3 (three) times daily.     Marland Kitchen RIVAROXABAN 15 MG PO TABS Oral Take 15 mg by mouth daily.      Marland Kitchen SACCHAROMYCES BOULARDII 250 MG PO CAPS Oral Take 250 mg by mouth 2 (two) times daily.        BP 147/85  Pulse 87  Temp(Src) 98.3 F (36.8 C) (Oral)  Resp 15  SpO2 97%  Physical Exam  Nursing note and vitals reviewed. Constitutional: She is oriented to person, place, and time. She appears well-developed and well-nourished. No distress.  HENT:  Head: Normocephalic and atraumatic.       No battle sign or raccoon eyes  Neck: Normal range of motion. Neck supple.  Cardiovascular: Normal rate and regular rhythm.   Pulmonary/Chest: Effort normal and breath sounds normal. She exhibits no tenderness.  Abdominal: Soft. She exhibits no distension. There is no tenderness. There is no rebound.  Musculoskeletal:       Pain with palpation over right hip. There is no shortening or rotation of lower extremities. Normal dorsal pedal pulses bilaterally. Patient able to move feet both legs. Pelvis is stable.   Neurological: She is alert and oriented to person, place, and time.  Skin: Skin is warm and dry. No rash noted.  Psychiatric: She has a normal mood and affect. Her behavior is normal.    ED Course  Procedures      Dg Hip Complete Right  02/25/2012  *RADIOLOGY REPORT*  Clinical Data: Right hip pain.  RIGHT HIP - COMPLETE 2+ VIEW  Comparison: Plain films 02/15/2012.  Findings: No fracture or dislocation is identified.  There is infiltration of fatty soft tissues about the right hip compatible with contusion.  No notable degenerative disease.  IMPRESSION:  1.  Negative for fracture dislocation. 2.  Likely soft tissue contusion about the right hip.  Original Report Authenticated By: Bernadene Bell. Maricela Curet, M.D.   Ct Pelvis Wo Contrast  02/25/2012  *RADIOLOGY REPORT*  Clinical Data:  Hip injury after fall.  CT PELVIS  WITHOUT CONTRAST   Technique:  Multidetector CT imaging of the pelvis was performed following the standard protocol without intravenous contrast.  Comparison:  CT abdomen and pelvis 03/13/2011  Findings:  Circumscribed subcutaneous soft tissue hematoma lateral to the greater trochanter of the right hip and measuring about 5.7 x 7.9 cm.  No underlying fractures identified.  Visualized pelvis and sacrum appear intact.  Degenerative changes in the right hip with subcortical cyst formation in the acetabulum.  Degenerative changes also noted in the left hip.  Degenerative changes in the lower lumbar spine.  Vascular calcifications.  Intramuscular lipoma.  IMPRESSION: Subcutaneous soft tissue hematoma lateral to the greater trochanter of the right hip.  No underlying fractures.  Degenerative changes.  Original Report Authenticated By: Marlon Pel, M.D.     1. Hip pain   2. Contusion of hip       MDM  12:10AM patient seen and evaluated. Patient in no acute distress.  PT seen and discussed with Attending Physician.  CT scan does not show signs for fracture. Will discharge back to nursing home at this time.    Date: 02/24/2012  Rate: 89  Rhythm: atrial fibrillation  QRS Axis: normal  Intervals: normal  ST/T Wave abnormalities: nonspecific ST/T changes  Conduction Disutrbances:none  Narrative Interpretation:   Old EKG Reviewed: unchanged from 12/18/2011      Angus Seller, PA 02/25/12 [phone]

## 2012-02-25 NOTE — ED Provider Notes (Signed)
Medical screening examination/treatment/procedure(s) were conducted as a shared visit with non-physician practitioner(s) and myself.  I personally evaluated the patient during the encounter  2:04 AM Mild tenderness of right hip pain with range of motion of right hip. Patient and daughter advised of x-ray and CT findings.  Hanley Seamen, MD 02/25/12 [phone]

## 2012-02-25 NOTE — ED Notes (Signed)
Ptar called to transportpt back t [facility]

## 2012-02-26 LAB — URINE CULTURE
Colony Count: NO GROWTH
Culture: NO GROWTH

## 2012-03-05 ENCOUNTER — Encounter: Payer: Medicare Other | Admitting: Internal Medicine

## 2012-03-14 ENCOUNTER — Encounter (INDEPENDENT_AMBULATORY_CARE_PROVIDER_SITE_OTHER): Payer: Medicare Other | Admitting: Ophthalmology

## 2012-06-14 ENCOUNTER — Encounter: Payer: Medicare Other | Admitting: Internal Medicine

## 2012-08-13 ENCOUNTER — Encounter (INDEPENDENT_AMBULATORY_CARE_PROVIDER_SITE_OTHER): Payer: Medicare Other | Admitting: Ophthalmology

## 2012-08-14 ENCOUNTER — Ambulatory Visit (INDEPENDENT_AMBULATORY_CARE_PROVIDER_SITE_OTHER): Payer: Medicare Other | Admitting: Internal Medicine

## 2012-08-14 ENCOUNTER — Encounter: Payer: Self-pay | Admitting: Internal Medicine

## 2012-08-14 VITALS — BP 120/70 | HR 76 | Wt 115.0 lb

## 2012-08-14 DIAGNOSIS — R1312 Dysphagia, oropharyngeal phase: Secondary | ICD-10-CM

## 2012-08-14 NOTE — Progress Notes (Signed)
Patient ID: [name] [name], female   DOB: 08/23/1924, 78 y.o.   MRN: [id number]  This is an elderly demented white woman, she resides in a nursing home. She is here with her son. Apparently she developed some coughing problems while eating, speech pathology was consulted. Review of the records shows me that she had difficulties with mixed consistencies and they recommended she eats solids or liquids separately. She underwent speech pathology in her diet improved and they thought that her risk of aspiration was significantly reduced and they signed off.  Medications, allergies, past medical history, past surgical history, family history and social history are reviewed and updated in the EMR.   1. Oropharyngeal dysphagia    No further intervention is needed. This is improved after speech therapy. She certainly may have more problems in the future she can be seen as needed.

## 2012-08-14 NOTE — Patient Instructions (Addendum)
Follow-up as needed with Dr. Leone Payor.  Thank you for choosing me and [facility].  Iva Boop, M.D., [facility]

## 2012-08-20 ENCOUNTER — Encounter (INDEPENDENT_AMBULATORY_CARE_PROVIDER_SITE_OTHER): Payer: Medicare Other | Admitting: Ophthalmology

## 2012-09-05 ENCOUNTER — Encounter (INDEPENDENT_AMBULATORY_CARE_PROVIDER_SITE_OTHER): Payer: Medicare Other | Admitting: Ophthalmology

## 2012-09-05 DIAGNOSIS — H3530 Unspecified macular degeneration: Secondary | ICD-10-CM

## 2012-09-05 DIAGNOSIS — H43819 Vitreous degeneration, unspecified eye: Secondary | ICD-10-CM

## 2012-09-05 DIAGNOSIS — I10 Essential (primary) hypertension: Secondary | ICD-10-CM

## 2012-09-05 DIAGNOSIS — H35039 Hypertensive retinopathy, unspecified eye: Secondary | ICD-10-CM

## 2012-11-13 IMAGING — CT CT HEAD W/O CM
1 of 4 series · 13 of 30 positions shown, 17 images · non-contrast
Comparison: 12/06/2010

CLINICAL DATA: Shortness of breath, altered mental status, fall

CT HEAD WITHOUT CONTRAST
TECHNIQUE: Contiguous axial images were obtained from the base of
the skull through the vertex without contrast.

[Series 3: recon 2: brain · axial · 0.47mm/px · z∈[+142,+281]mm · 13 of 64 slices shown, 17 images]
[im 5/64  brain]
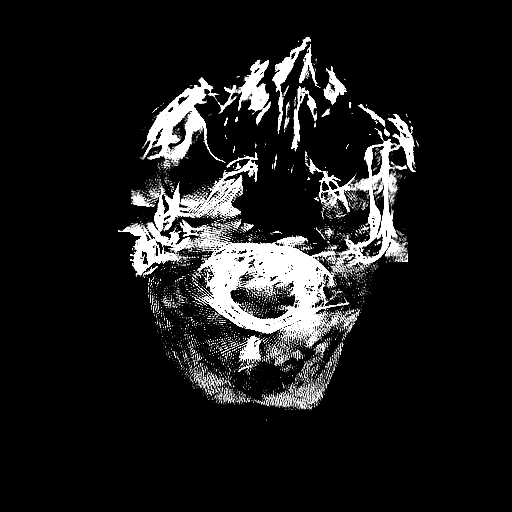
[im 5/64  bone]
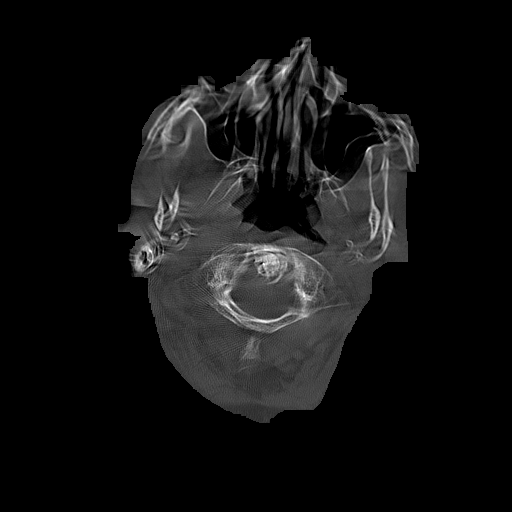
[im 10/64  brain]
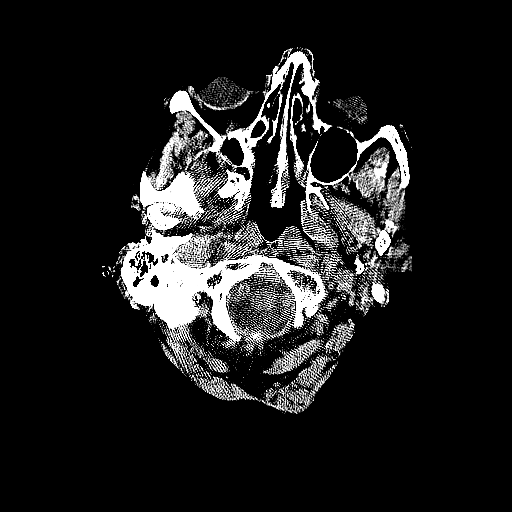
[im 14/64  brain]
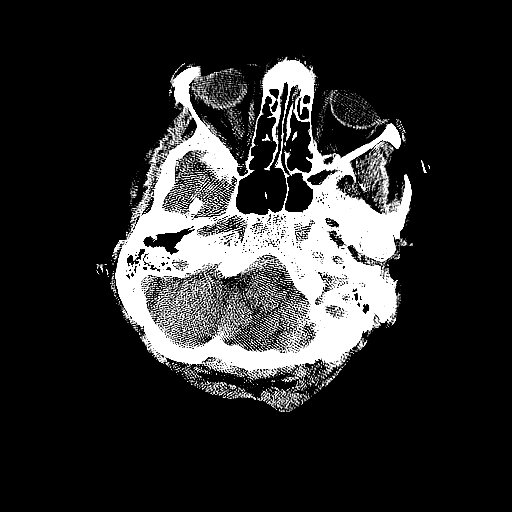
[im 19/64  brain]
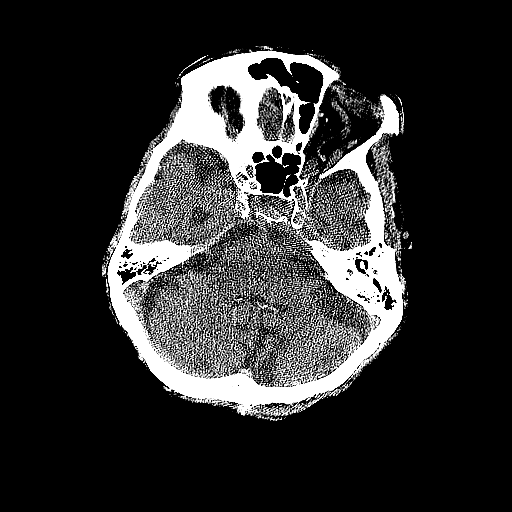
[im 23/64  brain]
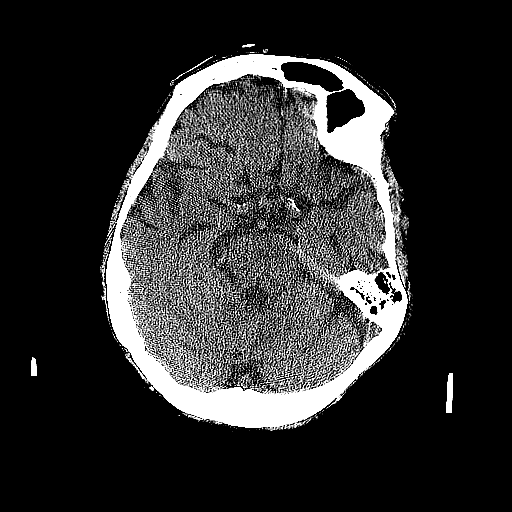
[im 23/64  bone]
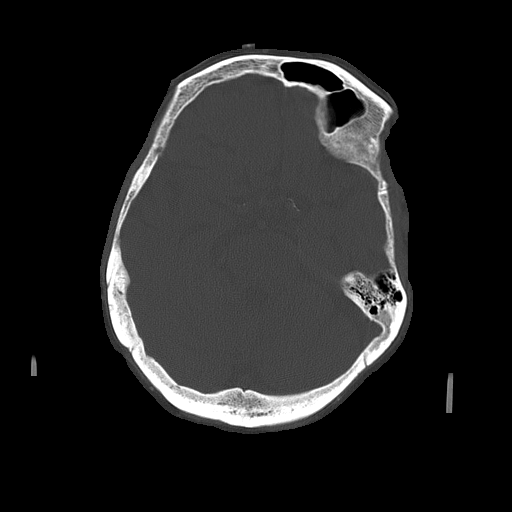
[im 28/64  brain]
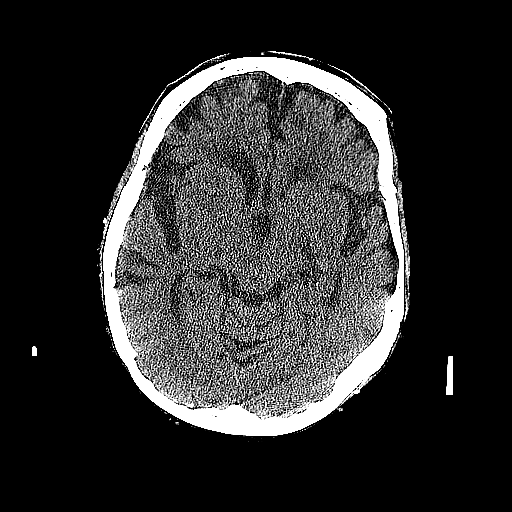
[im 32/64  brain]
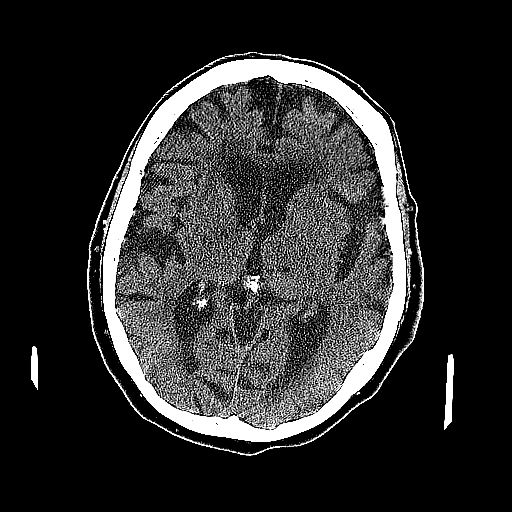
[im 37/64  brain]
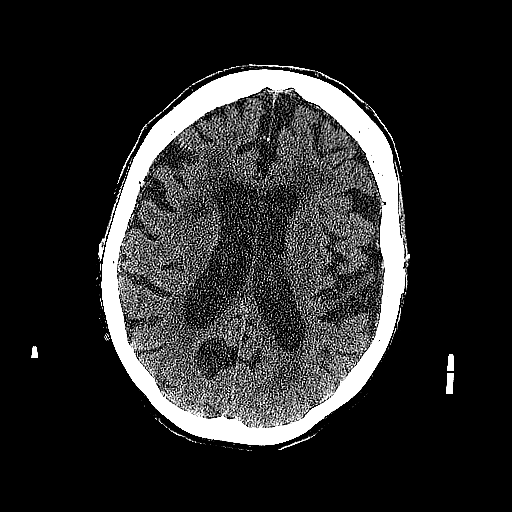
[im 41/64  brain]
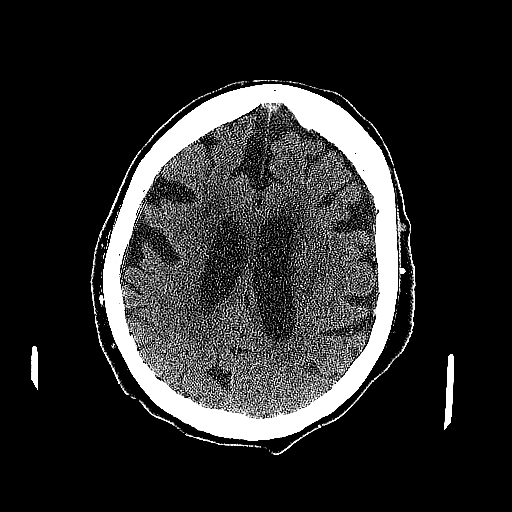
[im 41/64  bone]
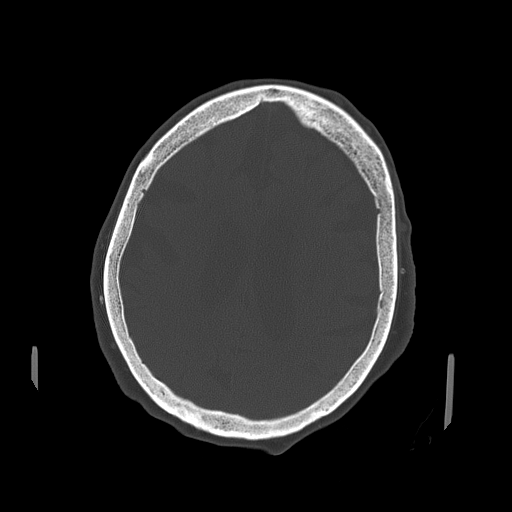
[im 46/64  brain]
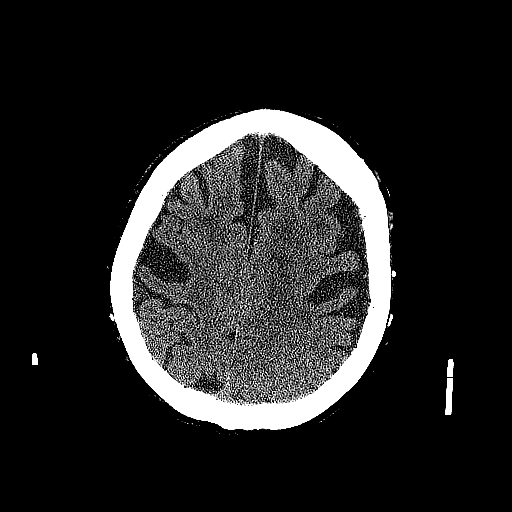
[im 50/64  brain]
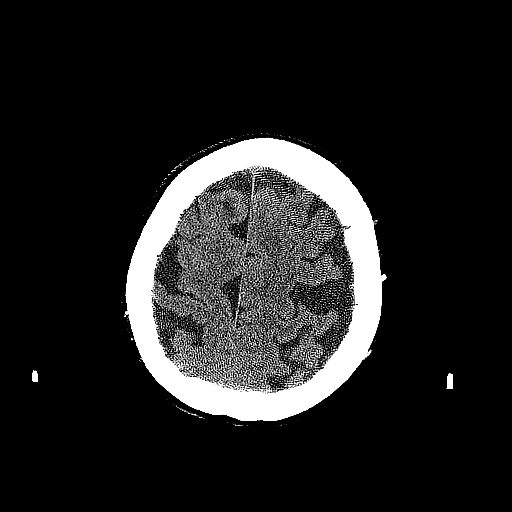
[im 55/64  brain]
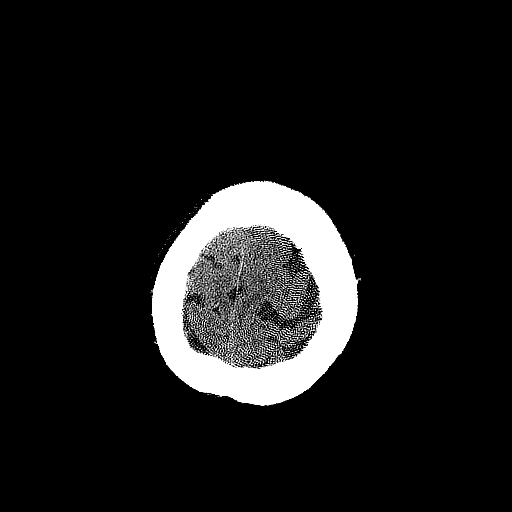
[im 59/64  brain]
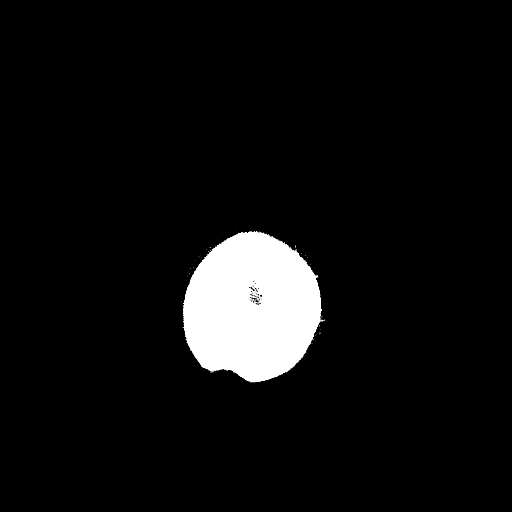
[im 59/64  bone]
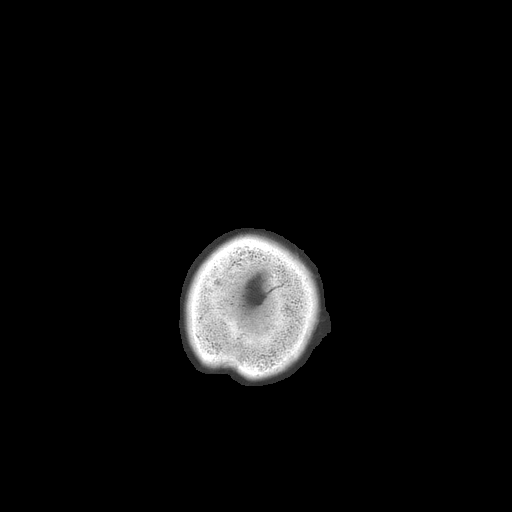

[13 of 30 positions shown; findings below may reference images not displayed]

FINDINGS: No evidence of parenchymal hemorrhage or extra-axial
fluid collection. No mass lesion, mass effect, or midline shift.

No CT evidence of acute infarction.  Extensive periventricular and
subcortical white matter small vessel ischemic changes.  Prior
lacunar infarcts in the right centrum semiovale [REDACTED] and left
thalamus.  Intracranial atherosclerosis.

Global cortical atrophy with secondary ventriculomegaly.

The visualized paranasal sinuses are essentially clear. The mastoid
air cells are unopacified.

No evidence of calvarial fracture.
IMPRESSION: No evidence of acute intracranial abnormality.

Atrophy with small vessel ischemic changes, prior lacunar infarcts,
and intracranial atherosclerosis.

## 2012-12-26 ENCOUNTER — Telehealth: Payer: Self-pay | Admitting: Internal Medicine

## 2012-12-26 NOTE — Telephone Encounter (Signed)
Attempted to contact the patient this morning to schedule an appt for HM.  Spoke with her daughter,[name] [name].  She informed me that [name] now resides at [facility].  Her and her brother would talk to see if they wanted to keep their mom here as a patient or let the SNF handle all of her health care needs.

## 2013-01-21 ENCOUNTER — Other Ambulatory Visit: Payer: Self-pay | Admitting: *Deleted

## 2013-01-21 MED ORDER — LORAZEPAM 2 MG/ML IJ SOLN: INTRAMUSCULAR | Status: DC

## 2013-03-28 ENCOUNTER — Encounter: Payer: Self-pay | Admitting: Nurse Practitioner

## 2013-03-28 ENCOUNTER — Other Ambulatory Visit: Payer: Self-pay | Admitting: *Deleted

## 2013-03-28 MED ORDER — HYDROCODONE-ACETAMINOPHEN 5-325 MG PO TABS: ORAL_TABLET | ORAL | Status: DC

## 2013-05-15 ENCOUNTER — Non-Acute Institutional Stay (SKILLED_NURSING_FACILITY): Payer: Medicare Other | Admitting: Adult Health

## 2013-05-15 DIAGNOSIS — E876 Hypokalemia: Secondary | ICD-10-CM

## 2013-05-15 DIAGNOSIS — N390 Urinary tract infection, site not specified: Secondary | ICD-10-CM

## 2013-05-15 DIAGNOSIS — K219 Gastro-esophageal reflux disease without esophagitis: Secondary | ICD-10-CM

## 2013-05-15 DIAGNOSIS — M479 Spondylosis, unspecified: Secondary | ICD-10-CM

## 2013-05-15 DIAGNOSIS — F411 Generalized anxiety disorder: Secondary | ICD-10-CM

## 2013-05-15 DIAGNOSIS — A0471 Enterocolitis due to Clostridium difficile, recurrent: Secondary | ICD-10-CM

## 2013-05-15 DIAGNOSIS — I4891 Unspecified atrial fibrillation: Secondary | ICD-10-CM

## 2013-05-15 DIAGNOSIS — I10 Essential (primary) hypertension: Secondary | ICD-10-CM

## 2013-05-15 DIAGNOSIS — K5900 Constipation, unspecified: Secondary | ICD-10-CM

## 2013-05-15 DIAGNOSIS — A0472 Enterocolitis due to Clostridium difficile, not specified as recurrent: Secondary | ICD-10-CM

## 2013-05-15 DIAGNOSIS — R609 Edema, unspecified: Secondary | ICD-10-CM

## 2013-05-15 DIAGNOSIS — E039 Hypothyroidism, unspecified: Secondary | ICD-10-CM

## 2013-05-20 ENCOUNTER — Encounter: Payer: Self-pay | Admitting: Adult Health

## 2013-05-20 DIAGNOSIS — R609 Edema, unspecified: Secondary | ICD-10-CM | POA: Insufficient documentation

## 2013-05-20 DIAGNOSIS — K5900 Constipation, unspecified: Secondary | ICD-10-CM | POA: Insufficient documentation

## 2013-05-20 DIAGNOSIS — E876 Hypokalemia: Secondary | ICD-10-CM | POA: Insufficient documentation

## 2013-05-20 DIAGNOSIS — K219 Gastro-esophageal reflux disease without esophagitis: Secondary | ICD-10-CM | POA: Insufficient documentation

## 2013-05-20 NOTE — Assessment & Plan Note (Signed)
Will continue prilosec 20 mg daily  

## 2013-05-20 NOTE — Assessment & Plan Note (Signed)
Is stable will continue celexa 20 mg daily will monitor

## 2013-05-20 NOTE — Assessment & Plan Note (Signed)
Will continue lasix 60 mg twice daily

## 2013-05-20 NOTE — Progress Notes (Signed)
Patient ID: [name] [name], female   DOB: [birth year], 79 y.o.   MRN: [id number]  [name]  Allergies  Allergen Reactions  . Penicillins     REACTION: red face     Chief Complaint  Patient presents with  . Medical Managment of Chronic Issues    HPI:  She is being seen for the management of chronic illnesses. There are no concerns being voiced by the nursing staff. She has been stable overall in the past couple of months.   Past Medical History  Diagnosis Date  . Renal insufficiency   . Hypertension   . A-fib   . Anxiety   . Depression   . Hypothyroidism   . Inguinal hernia     repaired 2011, right  . Osteoarthritis   . Cerebral vascular accident   . DJD (degenerative joint disease)   . Recurrent UTI   . C. difficile colitis     recurrent  . Dementia   . Anemia   . Cataract   . Asteatotic eczema   . Peripheral vascular disease   . Hyponatremia   . Malnutrition   . Sepsis(995.91)   . TIA (transient ischemic attack)   . Urinary incontinence     Past Surgical History  Procedure Laterality Date  . Inguinal hernia repair    . Bladder surgery      prolapsed bladder  . Cataract extraction      bilateral    VITAL SIGNS BP 132/76  Pulse 70  Ht 5\' 9"  (1.753 m)  Wt 103 lb 9.6 oz (46.993 kg)  BMI 15.29 kg/m2   Patient's Medications  New Prescriptions   No medications on file  Previous Medications   ACETAMINOPHEN (TYLENOL) 500 MG TABLET    Take 500 mg by mouth 2 (two) times daily.   CITALOPRAM (CELEXA) 20 MG TABLET    Take 20 mg by mouth daily.     DILTIAZEM (DILACOR XR) 240 MG 24 HR CAPSULE    Take 120 mg by mouth daily.    FERROUS SULFATE 325 (65 FE) MG TABLET    Take 325 mg by mouth daily with breakfast.     FLUTICASONE (FLONASE) 50 MCG/ACT NASAL SPRAY    Place 1 spray into the nose at bedtime.    FUROSEMIDE (LASIX) 80 MG TABLET    Take 60 mg by mouth 2 (two) times daily.    HYDROCODONE-ACETAMINOPHEN (NORCO/VICODIN) 5-325 MG PER TABLET    Take 2  tablets every morning for chronic pain. Take 1 tablet every 8 hours as needed for chronic pain   HYDROXYPROPYL METHYLCELLULOSE (ISOPTO TEARS) 2.5 % OPHTHALMIC SOLUTION    Place 1 drop into both eyes 4 (four) times daily as needed.   KLOR-CON M20 20 MEQ TABLET    Take 1 tablet by mouth 2 (two) times daily.   LEVOTHYROXINE (SYNTHROID, LEVOTHROID) 100 MCG TABLET    Take 225 mcg by mouth daily.    LORAZEPAM (ATIVAN) 2 MG/ML INJECTION    Inject 0.25ML=0.5MG  intramuscularly as needed for anxiety   MIRTAZAPINE (REMERON) 15 MG TABLET    Take 15 mg by mouth at bedtime.     OMEPRAZOLE (PRILOSEC) 20 MG CAPSULE    Take 20 mg by mouth daily.   OXYCODONE HCL, ABUSE DETER, 5 MG TABA    Take 1 tablet by mouth every 6 (six) hours as needed.   PROTEIN (PROCEL) PACK    Take 1 each by mouth 3 (three) times daily.    RIVAROXABAN (XARELTO)  15 MG TABS TABLET    Take 15 mg by mouth daily.     SACCHAROMYCES BOULARDII (FLORASTOR) 250 MG CAPSULE    Take 500 mg by mouth 2 (two) times daily.    SENNOSIDES-DOCUSATE SODIUM (SENOKOT-S) 8.6-50 MG TABLET    Take 1 tablet by mouth 2 (two) times daily.   SULFAMETHOXAZOLE-TRIMETHOPRIM (BACTRIM DS) 800-160 MG PER TABLET    Take 1 tablet by mouth daily.  Modified Medications   No medications on file  Discontinued Medications   No medications on file    SIGNIFICANT DIAGNOSTIC EXAMS    LABS REVIEWED  02-05-13: wbc 4.4; hgb 11.0; hct 34.8; mcv 84.5 ;plt 214; glucose 79; bun 26; creat 1.16; k+4.1; na++139 03-27-13: tsh 7.763 04-25-13: tsh 1.0800    Review of Systems  Constitutional: Negative for malaise/fatigue.  Respiratory: Negative for cough.   Cardiovascular: Negative for chest pain.  Gastrointestinal: Negative for abdominal pain.  Musculoskeletal: Negative for myalgias.  Skin: Negative.   Psychiatric/Behavioral: Negative for depression.        Physical Exam  Constitutional:  frail  Neck: Neck supple. No JVD present. No thyromegaly present.  Cardiovascular:  Normal rate, regular rhythm and intact distal pulses.   Respiratory: Effort normal and breath sounds normal. No respiratory distress. She has no wheezes.  GI: Soft. Bowel sounds are normal. She exhibits no distension. There is no tenderness.  Musculoskeletal: She exhibits no edema.  Is able to move extremities   Neurological: She is alert.  Skin: Skin is warm and dry.       ASSESSMENT/ PLAN:  ANXIETY Is stable will continue celexa 20 mg daily will monitor   Recurrent colitis due to Clostridium difficile There are no reports of diarrhea present is on long term florastor twice daily. She is on long term abt for chronic uti's per Dr. Elenore Paddy report if she develops c-diff will need to be treated with vancomycin.   Atrial fibrillation There are no reports of any bleeding present will continue xarelto 15 mg daily and will monitor her status   HYPERTENSION Will continue diltiazem cd 120 mg daily   UTI'S, RECURRENT No recent uti present will continue long term septra ds daily and will monitor   HYPOTHYROIDISM Will continue synthroid 225 mcg daily her latest ts his 1.0800  Hypokalemia Will continue k+20 meq twice daily   Degenerative joint disease of spine No complaints of pain present will continue tylenol 500 mg twice daily and  vicodin 5/325 mg 2 tabs in the am and every 8 hours as needed and will continue to monitor her status   GERD (gastroesophageal reflux disease) Will continue prilosec 20 mg daily   Unspecified constipation Will continue senna s twice daily  Edema Will continue lasix 60 mg twice daily    Time spent with patient 50 minutes

## 2013-05-20 NOTE — Assessment & Plan Note (Signed)
Will continue k+ 20 meq twice daily  

## 2013-05-20 NOTE — Assessment & Plan Note (Signed)
Will continue synthroid 225 mcg daily her latest ts his 1.0800

## 2013-05-20 NOTE — Assessment & Plan Note (Signed)
Will continue senna s twice daily  

## 2013-05-20 NOTE — Assessment & Plan Note (Signed)
No recent uti present will continue long term septra ds daily and will monitor

## 2013-05-20 NOTE — Assessment & Plan Note (Signed)
There are no reports of diarrhea present is on long term florastor twice daily. She is on long term abt for chronic uti's per Dr. Elenore Paddy report if she develops c-diff will need to be treated with vancomycin.

## 2013-05-20 NOTE — Assessment & Plan Note (Signed)
There are no reports of any bleeding present will continue xarelto 15 mg daily and will monitor her status

## 2013-05-20 NOTE — Assessment & Plan Note (Signed)
Will continue diltiazem cd 120 mg daily

## 2013-05-20 NOTE — Assessment & Plan Note (Addendum)
No complaints of pain present will continue tylenol 500 mg twice daily and  vicodin 5/325 mg 2 tabs in the am and every 8 hours as needed and will continue to monitor her status

## 2013-06-10 NOTE — Progress Notes (Signed)
This encounter was created in error - please disregard.

## 2013-06-11 ENCOUNTER — Non-Acute Institutional Stay (SKILLED_NURSING_FACILITY): Payer: Medicare Other | Admitting: Adult Health

## 2013-06-11 DIAGNOSIS — N390 Urinary tract infection, site not specified: Secondary | ICD-10-CM

## 2013-06-11 DIAGNOSIS — M47816 Spondylosis without myelopathy or radiculopathy, lumbar region: Secondary | ICD-10-CM

## 2013-06-11 DIAGNOSIS — E876 Hypokalemia: Secondary | ICD-10-CM

## 2013-06-11 DIAGNOSIS — I4891 Unspecified atrial fibrillation: Secondary | ICD-10-CM

## 2013-06-11 DIAGNOSIS — K5900 Constipation, unspecified: Secondary | ICD-10-CM

## 2013-06-11 DIAGNOSIS — R609 Edema, unspecified: Secondary | ICD-10-CM

## 2013-06-11 DIAGNOSIS — M47817 Spondylosis without myelopathy or radiculopathy, lumbosacral region: Secondary | ICD-10-CM

## 2013-06-11 DIAGNOSIS — K219 Gastro-esophageal reflux disease without esophagitis: Secondary | ICD-10-CM

## 2013-06-11 DIAGNOSIS — E039 Hypothyroidism, unspecified: Secondary | ICD-10-CM

## 2013-06-11 DIAGNOSIS — I10 Essential (primary) hypertension: Secondary | ICD-10-CM

## 2013-07-01 ENCOUNTER — Encounter: Payer: Self-pay | Admitting: Adult Health

## 2013-07-01 NOTE — Progress Notes (Signed)
Patient ID: [name] [name], female   DOB: May 18, 1924, 79 y.o.   MRN: [id number]  [name]  Allergies  Allergen Reactions  . Penicillins     REACTION: red face    Chief Complaint  Patient presents with  . Medical Managment of Chronic Issues    HPI  She is being seen for the management of her chronic illnesses. Her family is concerned that she has edema in bilateral feet; however; staff reports that they have not seen any edema she is taking lasix twice daily.  I do not see any edema present at this time. She is unable participate in the hpi or hpi.   Past Medical History  Diagnosis Date  . Renal insufficiency   . Hypertension   . A-fib   . Anxiety   . Depression   . Hypothyroidism   . Inguinal hernia     repaired 2011, right  . Osteoarthritis   . Cerebral vascular accident   . DJD (degenerative joint disease)   . Recurrent UTI   . C. difficile colitis     recurrent  . Dementia   . Anemia   . Cataract   . Asteatotic eczema   . Peripheral vascular disease   . Hyponatremia   . Malnutrition   . Sepsis(995.91)   . TIA (transient ischemic attack)   . Urinary incontinence     Past Surgical History  Procedure Laterality Date  . Inguinal hernia repair    . Bladder surgery      prolapsed bladder  . Cataract extraction      bilateral    Current Outpatient Prescriptions on File Prior to Visit  Medication Sig Dispense Refill  . acetaminophen (TYLENOL) 500 MG tablet Take 500 mg by mouth 2 (two) times daily.      . citalopram (CELEXA) 20 MG tablet Take 20 mg by mouth daily.        Marland Kitchen diltiazem (DILACOR XR) 240 MG 24 hr capsule Take 120 mg by mouth daily.       . ferrous sulfate 325 (65 FE) MG tablet Take 325 mg by mouth daily with breakfast.        . fluticasone (FLONASE) 50 MCG/ACT nasal spray Place 1 spray into the nose at bedtime.       . furosemide (LASIX) 80 MG tablet Take 60 mg by mouth 2 (two) times daily.       Marland Kitchen HYDROcodone-acetaminophen (NORCO/VICODIN)  5-325 MG per tablet Take 2 tablets every morning for chronic pain. Take 1 tablet every 8 hours as needed for chronic pain  150 tablet  5  . hydroxypropyl methylcellulose (ISOPTO TEARS) 2.5 % ophthalmic solution Place 1 drop into both eyes 4 (four) times daily as needed.      Marland Kitchen KLOR-CON M20 20 MEQ tablet Take 1 tablet by mouth 2 (two) times daily.      Marland Kitchen levothyroxine (SYNTHROID, LEVOTHROID) 100 MCG tablet Take 225 mcg by mouth daily.       Marland Kitchen LORazepam (ATIVAN) 2 MG/ML injection Inject 0.25ML=0.5MG  intramuscularly as needed for anxiety  45 mL  5  . mirtazapine (REMERON) 15 MG tablet Take 15 mg by mouth at bedtime.        Marland Kitchen omeprazole (PRILOSEC) 20 MG capsule Take 20 mg by mouth daily.      . OxyCODONE HCl, Abuse Deter, 5 MG TABA Take 1 tablet by mouth every 6 (six) hours as needed.      . Protein (PROCEL) PACK Take 1 each  by mouth 3 (three) times daily.       . Rivaroxaban (XARELTO) 15 MG TABS tablet Take 15 mg by mouth daily.        . sennosides-docusate sodium (SENOKOT-S) 8.6-50 MG tablet Take 1 tablet by mouth 2 (two) times daily.      Marland Kitchen sulfamethoxazole-trimethoprim (BACTRIM DS) 800-160 MG per tablet Take 1 tablet by mouth daily.       No current facility-administered medications on file prior to visit.    Filed Vitals:   06/11/13 1551  BP: 119/79  Pulse: 78  Height: 5\' 9"  (1.753 m)  Weight: 101 lb 12.8 oz (46.176 kg)    SIGNIFICANT DIAGNOSTIC EXAMS    LABS REVIEWED  02-05-13: wbc 4.4; hgb 11.0; hct 34.8; mcv 84.5 ;plt 214; glucose 79; bun 26; creat 1.16; k+4.1; na++139 03-27-13: tsh 7.763 04-25-13: tsh 1.0800    Review of Systems  Constitutional: Negative for malaise/fatigue.  Respiratory: Negative for cough.   Cardiovascular: Negative for chest pain.  Gastrointestinal: Negative for abdominal pain.  Musculoskeletal: Negative for myalgias.  Skin: Negative.   Psychiatric/Behavioral: Negative for depression.        Physical Exam  Constitutional:  frail  Neck: Neck  supple. No JVD present. No thyromegaly present.  Cardiovascular: Normal rate, regular rhythm and intact distal pulses.   Respiratory: Effort normal and breath sounds normal. No respiratory distress. She has no wheezes.  GI: Soft. Bowel sounds are normal. She exhibits no distension. There is no tenderness.  Musculoskeletal: She exhibits no edema.  Is able to move extremities   Neurological: She is alert.  Skin: Skin is warm and dry.       ASSESSMENT/ PLAN:  ANXIETY Is stable will continue celexa 20 mg daily will monitor   Recurrent colitis due to Clostridium difficile There are no reports of diarrhea present is on long term florastor twice daily. She is on long term abt for chronic uti's per Dr. Elenore Paddy report if she develops c-diff will need to be treated with vancomycin.   Atrial fibrillation There are no reports of any bleeding present will continue xarelto 15 mg daily and will monitor her status   HYPERTENSION Will continue diltiazem cd 120 mg daily   UTI'S, RECURRENT No recent uti present will continue long term septra ds daily and will monitor   HYPOTHYROIDISM Will continue synthroid 225 mcg daily her latest ts his 1.0800  Hypokalemia Will continue k+20 meq twice daily   Degenerative joint disease of spine No complaints of pain present will continue tylenol 500 mg twice daily and  vicodin 5/325 mg 2 tabs in the am and every 8 hours as needed and will continue to monitor her status   GERD (gastroesophageal reflux disease) Will continue prilosec 20 mg daily   Unspecified constipation Will continue senna s twice daily  Edema Will continue lasix 60 mg twice daily

## 2013-08-10 ENCOUNTER — Other Ambulatory Visit: Payer: Self-pay

## 2013-08-10 LAB — URINALYSIS, COMPLETE
Bilirubin,UR: NEGATIVE
Blood: NEGATIVE
Hyaline Cast: 11
Nitrite: NEGATIVE
Ph: 5 (ref 4.5–8.0)
Protein: NEGATIVE
Specific Gravity: 1.015 (ref 1.003–1.030)
WBC UR: 11 /HPF (ref 0–5)

## 2013-08-12 LAB — URINE CULTURE

## 2013-08-15 ENCOUNTER — Other Ambulatory Visit: Payer: Self-pay

## 2013-08-15 ENCOUNTER — Non-Acute Institutional Stay (SKILLED_NURSING_FACILITY): Payer: PRIVATE HEALTH INSURANCE | Admitting: Internal Medicine

## 2013-08-15 DIAGNOSIS — E039 Hypothyroidism, unspecified: Secondary | ICD-10-CM

## 2013-08-15 DIAGNOSIS — N3090 Cystitis, unspecified without hematuria: Secondary | ICD-10-CM | POA: Insufficient documentation

## 2013-08-15 DIAGNOSIS — E876 Hypokalemia: Secondary | ICD-10-CM

## 2013-08-15 DIAGNOSIS — K5900 Constipation, unspecified: Secondary | ICD-10-CM

## 2013-08-15 DIAGNOSIS — K219 Gastro-esophageal reflux disease without esophagitis: Secondary | ICD-10-CM

## 2013-08-15 DIAGNOSIS — F329 Major depressive disorder, single episode, unspecified: Secondary | ICD-10-CM

## 2013-08-15 DIAGNOSIS — I4891 Unspecified atrial fibrillation: Secondary | ICD-10-CM

## 2013-08-15 DIAGNOSIS — M479 Spondylosis, unspecified: Secondary | ICD-10-CM

## 2013-08-15 DIAGNOSIS — R609 Edema, unspecified: Secondary | ICD-10-CM

## 2013-08-15 MED ORDER — LORAZEPAM 1 MG PO TABS: ORAL_TABLET | ORAL | Status: DC

## 2013-08-15 NOTE — Progress Notes (Signed)
Patient ID: [name] [name], female   DOB: [birth year], 79 y.o.   MRN: [id number]  [facility]  Chief complaint- medical management of chronic illness, new patient  Allergies reviewed  Code status- dnr  HPI 79 y/o female patient is seen today for first time after being established under [facility]. She has been at her baseline. No new skin concern or behavioral concern for her at present. Over the last week pt was found to be agitated by her family which was new and urine workup was send the result of which is pending. No recent agitation. No falls reported. She has been found to be anemic and there has been questions about holding her xarelto.  unable to obtain ROS from patient due to her dementia  ROS- reviewed with nursing staff Constitutional: Negative for malaise/fatigue.  Respiratory: Negative for cough.   Cardiovascular: Negative for chest pain.  Gastrointestinal: Negative for abdominal pain.  Musculoskeletal: Negative for myalgias.  Skin: Negative.   Psychiatric/Behavioral: Negative for depression.   Past Medical History  Diagnosis Date  . Renal insufficiency   . Hypertension   . A-fib   . Anxiety   . Depression   . Hypothyroidism   . Inguinal hernia     repaired 2011, right  . Osteoarthritis   . Cerebral vascular accident   . DJD (degenerative joint disease)   . Recurrent UTI   . C. difficile colitis     recurrent  . Dementia   . Anemia   . Cataract   . Asteatotic eczema   . Peripheral vascular disease   . Hyponatremia   . Malnutrition   . Sepsis(995.91)   . TIA (transient ischemic attack)   . Urinary incontinence    Past Surgical History  Procedure Laterality Date  . Inguinal hernia repair    . Bladder surgery      prolapsed bladder  . Cataract extraction      bilateral   Medication reviewed. See MAR  Physical Exam   BP 129/86  Pulse 88  Temp(Src) 97.5 F (36.4 C)  Resp 16  Constitutional:  frail , in NAD Neck: Neck supple.  No JVD present. No thyromegaly present.  Cardiovascular: Normal rate, regular rhythm and diminished peripheral pulses Respiratory: Effort normal and breath sounds normal. No respiratory distress. She has no wheezes.  GI: Soft. Bowel sounds are normal. She exhibits no distension. There is no tenderness.  Musculoskeletal: She exhibits no edema. Transferred to wheelchair with assistance and her wheelchair is propelled. Bilateral foot eversion, has left 2nd toe with hammer toe and bunion Is able to move extremities   Neurological: She is alert.  Skin: Skin is warm and dry.   Labs reviewed- 07/09/13 wbc 4, hb 10.5, hct 34.9, plt 239, na 137, k 4.5, co2 29, bun 33, cr 1.1, glu 88, ca 9.6. T.chol 97, tg 35, hdl 37, ldl 53  ASSESSMENT/ PLAN:  depression Is stable will continue celexa 20 mg daily will monitor   GERD  Will continue prilosec 40 mg daily, symptoms under control  HYPOTHYROIDISM Will continue synthroid 225 mcg daily for now and check tsh  Hypokalemia Continue kcl supplement, monitor bmp  Iron deficiency anemia Continue iron supplement, pt on xarelto with increased risk for bleeding, monitor h/h  Atrial fibrillation Continue diltiazem 120 mg CD daily for rate control and continue xarelto 15 mg daily for secondary stroke prophylaxis  Edema Will continue lasix 60 mg twice daily, continue kcl supplement, monitor bmp  Recurrent  cystitis Continue prophylactic septra for now and monitor clinically, pending urine culture  Degenerative joint disease of spine continue tylenol 500 mg twice daily and  vicodin 5/325 mg 2 tabs in the am and every 8 hours as needed and will continue to monitor her status. Continue ca-vit d supplement  Unspecified constipation Will continue senna s twice daily and MOM

## 2013-09-02 ENCOUNTER — Ambulatory Visit (INDEPENDENT_AMBULATORY_CARE_PROVIDER_SITE_OTHER): Payer: Self-pay | Admitting: Ophthalmology

## 2013-09-06 ENCOUNTER — Ambulatory Visit (INDEPENDENT_AMBULATORY_CARE_PROVIDER_SITE_OTHER): Payer: Medicare Other | Admitting: Ophthalmology

## 2013-09-14 ENCOUNTER — Other Ambulatory Visit: Payer: Self-pay

## 2013-09-23 ENCOUNTER — Non-Acute Institutional Stay (SKILLED_NURSING_FACILITY): Payer: PRIVATE HEALTH INSURANCE | Admitting: Internal Medicine

## 2013-09-23 DIAGNOSIS — F3289 Other specified depressive episodes: Secondary | ICD-10-CM

## 2013-09-23 DIAGNOSIS — M1990 Unspecified osteoarthritis, unspecified site: Secondary | ICD-10-CM

## 2013-09-23 DIAGNOSIS — I4891 Unspecified atrial fibrillation: Secondary | ICD-10-CM

## 2013-09-23 DIAGNOSIS — F329 Major depressive disorder, single episode, unspecified: Secondary | ICD-10-CM

## 2013-09-23 DIAGNOSIS — E039 Hypothyroidism, unspecified: Secondary | ICD-10-CM

## 2013-09-23 DIAGNOSIS — R609 Edema, unspecified: Secondary | ICD-10-CM

## 2013-09-23 DIAGNOSIS — K5900 Constipation, unspecified: Secondary | ICD-10-CM

## 2013-09-23 DIAGNOSIS — N3090 Cystitis, unspecified without hematuria: Secondary | ICD-10-CM

## 2013-09-23 DIAGNOSIS — K219 Gastro-esophageal reflux disease without esophagitis: Secondary | ICD-10-CM

## 2013-09-23 NOTE — Progress Notes (Signed)
Patient ID: [name] [name], female   DOB: [birth year], 79 y.o.   MRN: [id number]  [facility]  Chief complaint- medical management of chronic illness  Allergies reviewed  Code status- dnr  HPI 79 y/o female patient is seen today for routine follow up. She has hx of dementia is unable to provide any history or ROS. She is in no acute distress  ROS- reviewed with nursing staff Constitutional: Negative for malaise/fatigue.   Respiratory: Negative for cough.    Cardiovascular: Negative for chest pain.   Gastrointestinal: Negative for abdominal pain.   Musculoskeletal: Negative for myalgias.   Skin: Negative.    Psychiatric/Behavioral: Negative for depression.   Past Medical History  Diagnosis Date  . Renal insufficiency   . Hypertension   . A-fib   . Anxiety   . Depression   . Hypothyroidism   . Inguinal hernia     repaired 2011, right  . Osteoarthritis   . Cerebral vascular accident   . DJD (degenerative joint disease)   . Recurrent UTI   . C. difficile colitis     recurrent  . Dementia   . Anemia   . Cataract   . Asteatotic eczema   . Peripheral vascular disease   . Hyponatremia   . Malnutrition   . Sepsis(995.91)   . TIA (transient ischemic attack)   . Urinary incontinence    Past Surgical History  Procedure Laterality Date  . Inguinal hernia repair    . Bladder surgery      prolapsed bladder  . Cataract extraction      bilateral     Medication reviewed. See MAR  Physical Exam  BP 128/64  Pulse 88  Temp(Src) 97.4 F (36.3 C)  Resp 16  SpO2 96%  Constitutional:  frail , in NAD Neck: Neck supple. No JVD present. No thyromegaly present.   Cardiovascular: Normal rate, regular rhythm and diminished peripheral pulses Respiratory: Effort normal and breath sounds normal. No respiratory distress. She has no wheezes.   GI: Soft. Bowel sounds are normal. She exhibits no distension. There is no tenderness.  Musculoskeletal: She exhibits no  edema. Transferred to wheelchair with assistance and her wheelchair is propelled. Bilateral foot eversion, has left 2nd toe with hammer toe and bunion Is able to move extremities   Neurological: She is alert.   Skin: Skin is warm and dry.   Labs reviewed- 07/09/13 wbc 4, hb 10.5, hct 34.9, plt 239, na 137, k 4.5, co2 29, bun 33, cr 1.1, glu 88, ca 9.6. T.chol 97, tg 35, hdl 37, ldl 53 08/15/13 wbc 6.9, hb 10.5, hct 35.1, plt 242  ASSESSMENT/ PLAN:  HYPOTHYROIDISM Will continue synthroid 225 mcg daily for now   depression Is stable will continue celexa 20 mg daily will monitor   Atrial fibrillation Continue diltiazem 120 mg CD daily for rate control and continue xarelto 15 mg daily for secondary stroke prophylaxis  GERD   Will continue prilosec 40 mg daily, symptoms under control  Hypokalemia Continue kcl supplement, monitor bmp  b12 deficiency Continue b12 1000 mg daily  Iron deficiency anemia Continue iron supplement, pt on xarelto with increased risk for bleeding, monitor h/h  Edema Will continue lasix 60 mg twice daily, continue kcl supplement, monitor bmp  Recurrent cystitis Continue prophylactic septra for now and monitor clinically  Degenerative joint disease of spine continue tylenol 500 mg twice daily and  vicodin 5/325 mg 2 tabs in the am and every 8 hours as  needed and will continue to monitor her status. Continue ca-vit d supplement  Unspecified constipation Will continue senna s twice daily and MOM

## 2013-11-04 ENCOUNTER — Non-Acute Institutional Stay (SKILLED_NURSING_FACILITY): Payer: PRIVATE HEALTH INSURANCE | Admitting: Internal Medicine

## 2013-11-04 ENCOUNTER — Encounter: Payer: Self-pay | Admitting: Internal Medicine

## 2013-11-04 DIAGNOSIS — E039 Hypothyroidism, unspecified: Secondary | ICD-10-CM

## 2013-11-04 DIAGNOSIS — I4891 Unspecified atrial fibrillation: Secondary | ICD-10-CM

## 2013-11-04 DIAGNOSIS — F32A Depression, unspecified: Secondary | ICD-10-CM | POA: Insufficient documentation

## 2013-11-04 DIAGNOSIS — F3289 Other specified depressive episodes: Secondary | ICD-10-CM

## 2013-11-04 DIAGNOSIS — J069 Acute upper respiratory infection, unspecified: Secondary | ICD-10-CM

## 2013-11-04 DIAGNOSIS — F329 Major depressive disorder, single episode, unspecified: Secondary | ICD-10-CM

## 2013-11-04 DIAGNOSIS — N390 Urinary tract infection, site not specified: Secondary | ICD-10-CM

## 2013-11-04 NOTE — Progress Notes (Signed)
Patient ID: [name] BeringDorothy [name], female   DOB: 20-Jan-1924, 80 y.o.   MRN: [id number]    [facility]  Chief complaint- medical management of chronic illness  Allergies reviewed  Code status- dnr  HPI 80 y/o female patient is seen today for routine follow up. She has hx of dementia is unable to provide any history or ROS. She is in no acute distress. She is currently being treated for a UTI.No new concern from staff  ROS- reviewed with nursing staff Constitutional: Negative for malaise/fatigue.   Respiratory: Negative for cough.    Cardiovascular: Negative for chest pain.   Gastrointestinal: Negative for abdominal pain.   Musculoskeletal: Negative for myalgias.   Skin: Negative.    Psychiatric/Behavioral: Negative for depression.   Medication reviewed. See MAR  Physical Exam  BP 127/90  Pulse 90  Temp(Src) 97.2 F (36.2 C)  Resp 18  SpO2 95%  Constitutional:  frail , in NAD, thin built Neck: Neck supple. No JVD present. No thyromegaly present.   Cardiovascular: Normal rate, regular rhythm and diminished peripheral pulses Respiratory: Effort normal and breath sounds normal. No respiratory distress. She has no wheezes.   GI: Soft. Bowel sounds are normal. She exhibits no distension. There is no tenderness.  Musculoskeletal: She exhibits no edema. Transferred to wheelchair with assistance and her wheelchair is propelled. Bilateral foot eversion, has left 2nd toe with hammer toe and bunion Is able to move extremities   Neurological: She is alert.   Skin: Skin is warm and dry.   Labs reviewed- 07/09/13 wbc 4, hb 10.5, hct 34.9, plt 239, na 137, k 4.5, co2 29, bun 33, cr 1.1, glu 88, ca 9.6. T.chol 97, tg 35, hdl 37, ldl 53 08/15/13 wbc 6.9, hb 10.5, hct 35.1, plt 242 09/17/13 wbc 4.5, hb 10.6, hct 35.5, plt 264, na 139, k 4.6, bun 31, cr 1.2, glu 115, ca 9.1, cr cl 23, bnp 574  ASSESSMENT/ PLAN:  uti Complete course of macrodantin 100 mg bid for a week.  Continue florastor  URI Recently completed course of levaquin and is on cough expectorant. Monitor clinically  HYPOTHYROIDISM Will continue synthroid 225 mcg daily for now and check thyroid panel  depression Is stable will continue celexa 20 mg daily will monitor   Atrial fibrillation Continue diltiazem 120 mg CD daily for rate control and continue xarelto 15 mg daily for secondary stroke prophylaxis

## 2013-11-07 ENCOUNTER — Other Ambulatory Visit: Payer: Self-pay | Admitting: *Deleted

## 2013-11-07 MED ORDER — HYDROCODONE-ACETAMINOPHEN 5-325 MG PO TABS: ORAL_TABLET | ORAL | Status: DC

## 2013-11-28 ENCOUNTER — Non-Acute Institutional Stay (SKILLED_NURSING_FACILITY): Payer: PRIVATE HEALTH INSURANCE | Admitting: Internal Medicine

## 2013-11-28 DIAGNOSIS — E876 Hypokalemia: Secondary | ICD-10-CM

## 2013-11-28 DIAGNOSIS — E039 Hypothyroidism, unspecified: Secondary | ICD-10-CM

## 2013-11-28 DIAGNOSIS — D649 Anemia, unspecified: Secondary | ICD-10-CM

## 2013-11-28 DIAGNOSIS — K219 Gastro-esophageal reflux disease without esophagitis: Secondary | ICD-10-CM

## 2013-11-28 DIAGNOSIS — F3289 Other specified depressive episodes: Secondary | ICD-10-CM

## 2013-11-28 DIAGNOSIS — R609 Edema, unspecified: Secondary | ICD-10-CM

## 2013-11-28 DIAGNOSIS — I4891 Unspecified atrial fibrillation: Secondary | ICD-10-CM

## 2013-11-28 DIAGNOSIS — F329 Major depressive disorder, single episode, unspecified: Secondary | ICD-10-CM

## 2013-11-28 NOTE — Progress Notes (Signed)
Patient ID: [name] [name], female   DOB: 01-08-1924, 80 y.o.   MRN: [id number]    [facility]  Chief complaint- annual exam  Allergies reviewed  Code status- dnr  HPI 80 y/o female patient is seen today for routine follow up. She has hx of dementia is unable to provide any history or ROS. She is in no acute distress. No new skin concern. No falls reported. No recent behavioral issues from staff   ROS-  Unable to obtain  Past Medical History  Diagnosis Date  . Renal insufficiency   . Hypertension   . A-fib   . Anxiety   . Depression   . Hypothyroidism   . Inguinal hernia     repaired 2011, right  . Osteoarthritis   . Cerebral vascular accident   . DJD (degenerative joint disease)   . Recurrent UTI   . C. difficile colitis     recurrent  . Dementia   . Anemia   . Cataract   . Asteatotic eczema   . Peripheral vascular disease   . Hyponatremia   . Malnutrition   . Sepsis(995.91)   . TIA (transient ischemic attack)   . Urinary incontinence    Past Surgical History  Procedure Laterality Date  . Inguinal hernia repair    . Bladder surgery      prolapsed bladder  . Cataract extraction      bilateral   Current Outpatient Prescriptions on File Prior to Visit  Medication Sig Dispense Refill  . citalopram (CELEXA) 20 MG tablet Take 20 mg by mouth daily.        Marland Kitchen diltiazem (DILACOR XR) 240 MG 24 hr capsule Take 120 mg by mouth daily.       . fluticasone (FLONASE) 50 MCG/ACT nasal spray Place 1 spray into the nose at bedtime.       . furosemide (LASIX) 80 MG tablet Take 60 mg by mouth 2 (two) times daily.       Marland Kitchen HYDROcodone-acetaminophen (NORCO/VICODIN) 5-325 MG per tablet Take two tablets by mouth every morning for chronic pain/OA; Take one tablet by mouth every 8 hours as needed for chronic pain  150 tablet  0  . hydroxypropyl methylcellulose (ISOPTO TEARS) 2.5 % ophthalmic solution Place 1 drop into both eyes 4 (four) times daily as needed.      Marland Kitchen  KLOR-CON M20 20 MEQ tablet Take 1 tablet by mouth 2 (two) times daily.      Marland Kitchen levothyroxine (SYNTHROID, LEVOTHROID) 100 MCG tablet Take 225 mcg by mouth daily.       . mirtazapine (REMERON) 15 MG tablet Take 7.5 mg by mouth at bedtime.       Marland Kitchen omeprazole (PRILOSEC) 20 MG capsule Take 40 mg by mouth daily.       . Rivaroxaban (XARELTO) 15 MG TABS tablet Take 15 mg by mouth daily.        . sennosides-docusate sodium (SENOKOT-S) 8.6-50 MG tablet Take 1 tablet by mouth 2 (two) times daily.      Marland Kitchen sulfamethoxazole-trimethoprim (BACTRIM DS) 800-160 MG per tablet Take 1 tablet by mouth daily.       No current facility-administered medications on file prior to visit.   Family History  Problem Relation Age of Onset  . Kidney Stones Mother   . Colon cancer Neg Hx    History   Social History  . Marital Status: Divorced    Spouse Name: N/A    Number of Children:  N/A  . Years of Education: N/A   Occupational History  . Not on file.   Social History Main Topics  . Smoking status: Former Smoker    Types: Cigarettes    Quit date: 10/17/1960  . Smokeless tobacco: Never Used  . Alcohol Use: No  . Drug Use: No  . Sexual Activity: Not on file   Other Topics Concern  . Not on file   Social History Narrative   Nursing home resident - supportive family    Physical exam BP 110/77  Pulse 68  Temp(Src) 98 F (36.7 C)  Resp 16  SpO2 96%  Medication reviewed. See MAR  Physical Exam  BP 128/64  Pulse 88  Temp(Src) 97.4 F (36.3 C)  Resp 16  SpO2 96%  Constitutional: frail , in NAD Neck: Neck supple. No JVD present. No thyromegaly present.   Cardiovascular: Normal rate, regular rhythm and diminished peripheral pulses Respiratory: Effort normal and breath sounds normal. No respiratory distress. She has no wheezes.   GI: Soft. Bowel sounds are normal. She exhibits no distension. There is no tenderness.  Musculoskeletal: She exhibits no edema. Transferred to wheelchair with assistance  and her wheelchair is propelled. Bilateral foot eversion, has left 2nd toe with hammer toe and bunion. Is able to move extremities   Neurological: She is alert.   Skin: Skin is warm and dry.   Labs reviewed- 07/09/13 wbc 4, hb 10.5, hct 34.9, plt 239, na 137, k 4.5, co2 29, bun 33, cr 1.1, glu 88, ca 9.6. T.chol 97, tg 35, hdl 37, ldl 53 08/15/13 wbc 6.9, hb 10.5, hct 35.1, plt 242 10/31/13 na 140, k 4.5, bun 28, cr 1, ca 8.9 11/18/13 wbc 4.2, hb 9.8, hct 33, mcv 81.1, plt 311   Assessment/plan  HYPOTHYROIDISM Reviewed tsh level. Decrease levothyroxine to 200 mcg daily. Recheck tsh in 3 months  Edema improved edema. Change lasix to 40 mg daily. Continue kcl supplement. monitor bmp   depression continue celexa 20 mg daily will monitor   Atrial fibrillation Continue diltiazem 120 mg CD daily for rate control and continue xarelto 15 mg daily for secondary stroke prophylaxis  GERD   Will continue prilosec 40 mg daily, symptoms under control  Hypokalemia Stable potassium level. Decrease kcl to 20 meq daily   b12 deficiency Continue b12 1000 mg daily  Iron deficiency anemia Continue iron supplement, pt on xarelto with increased risk for bleeding, monitor h/h  Recurrent cystitis Continue prophylactic septra for now and monitor clinically  Degenerative joint disease of spine continue tylenol 500 mg twice daily and  vicodin 5/325 mg 2 tabs in the am and every 8 hours as needed and will continue to monitor her status. Continue ca-vit d supplement  Unspecified constipation Will continue senna s twice daily and MOM   Oneal GroutMAHIMA Mulki Roesler, MD  [facility] [phone] (Monday-Friday 8 am - 5 pm) [phone] (afterhours)

## 2013-12-27 ENCOUNTER — Non-Acute Institutional Stay (SKILLED_NURSING_FACILITY): Payer: PRIVATE HEALTH INSURANCE | Admitting: Internal Medicine

## 2013-12-27 DIAGNOSIS — F339 Major depressive disorder, recurrent, unspecified: Secondary | ICD-10-CM

## 2013-12-27 DIAGNOSIS — M159 Polyosteoarthritis, unspecified: Secondary | ICD-10-CM

## 2013-12-27 DIAGNOSIS — N3020 Other chronic cystitis without hematuria: Secondary | ICD-10-CM

## 2013-12-27 DIAGNOSIS — I4891 Unspecified atrial fibrillation: Secondary | ICD-10-CM

## 2013-12-27 DIAGNOSIS — H353190 Nonexudative age-related macular degeneration, unspecified eye, stage unspecified: Secondary | ICD-10-CM

## 2014-01-18 NOTE — Progress Notes (Signed)
Patient ID: Laurena BeringDorothy Dilley, female   DOB: 03-Feb-1924, 80 y.o.   MRN: [id number]    [facility]  Chief Complaint  Patient presents with  . Medical Managment of Chronic Issues    RV   Allergies reviewed  Code status- dnr  HPI 80 y/o female patient is seen today. She has dementia and is at her baseline. She is on healthy shake bid. She has had weight loss. She is in no acute distress. No new skin concern. No falls reported.   ROS-  Unable to obtain  Past Medical History  Diagnosis Date  . Renal insufficiency   . Hypertension   . A-fib   . Anxiety   . Depression   . Hypothyroidism   . Inguinal hernia     repaired 2011, right  . Osteoarthritis   . Cerebral vascular accident   . DJD (degenerative joint disease)   . Recurrent UTI   . C. difficile colitis     recurrent  . Dementia   . Anemia   . Cataract   . Asteatotic eczema   . Peripheral vascular disease   . Hyponatremia   . Malnutrition   . Sepsis(995.91)   . TIA (transient ischemic attack)   . Urinary incontinence    Past Surgical History  Procedure Laterality Date  . Inguinal hernia repair    . Bladder surgery      prolapsed bladder  . Cataract extraction      bilateral   [facility]  Physical exam BP 132/57  Pulse 86  Temp(Src) 97.2 F (36.2 C)  Resp 18  Constitutional: frail , in NAD Neck: Neck supple. No JVD present. No thyromegaly present.   Cardiovascular: Normal rate, regular rhythm and diminished peripheral pulses Respiratory: Effort normal and breath sounds normal. No respiratory distress. She has no wheezes.   GI: Soft. Bowel sounds are normal. She exhibits no distension. There is no tenderness.  Musculoskeletal: She exhibits no edema. Transferred to wheelchair with assistance and her wheelchair is propelled. Bilateral foot eversion, has left 2nd toe with hammer toe and bunion. Is able to move extremities   Neurological: She is alert.   Skin: Skin is  warm and dry.   Labs reviewed- 07/09/13 wbc 4, hb 10.5, hct 34.9, plt 239, na 137, k 4.5, co2 29, bun 33, cr 1.1, glu 88, ca 9.6. T.chol 97, tg 35, hdl 37, ldl 53 08/15/13 wbc 6.9, hb 10.5, hct 35.1, plt 242 10/31/13 na 140, k 4.5, bun 28, cr 1, ca 8.9 11/18/13 wbc 4.2, hb 9.8, hct 33, mcv 81.1, plt 311   Assessment/plan  depression continue celexa 20 mg daily will monitor   Recurrent cystitis Continue prophylactic septra for now and monitor clinically  Atrial fibrillation Continue diltiazem 120 mg CD daily for rate control and continue xarelto 15 mg daily for secondary stroke prophylaxis  Generalized OA continue tylenol 500 mg twice daily and  vicodin 5/325 mg 2 tabs in the am and every 8 hours as needed and will continue to monitor her status. Continue ca-vit d supplement  Macular degeneration Continue preservision and artifical tears. Follows with dr Ashley Royaltymatthews  HYPOTHYROIDISM Reviewed tsh level. On levothyroxine to 200 mcg daily. Pending tsh next lab draw

## 2014-01-19 DIAGNOSIS — N3020 Other chronic cystitis without hematuria: Secondary | ICD-10-CM | POA: Insufficient documentation

## 2014-01-19 DIAGNOSIS — H353190 Nonexudative age-related macular degeneration, unspecified eye, stage unspecified: Secondary | ICD-10-CM | POA: Insufficient documentation

## 2014-01-19 DIAGNOSIS — F339 Major depressive disorder, recurrent, unspecified: Secondary | ICD-10-CM | POA: Insufficient documentation

## 2014-01-19 DIAGNOSIS — M159 Polyosteoarthritis, unspecified: Secondary | ICD-10-CM | POA: Insufficient documentation

## 2014-02-10 ENCOUNTER — Non-Acute Institutional Stay (SKILLED_NURSING_FACILITY): Payer: PRIVATE HEALTH INSURANCE | Admitting: Internal Medicine

## 2014-02-10 DIAGNOSIS — I130 Hypertensive heart and chronic kidney disease with heart failure and stage 1 through stage 4 chronic kidney disease, or unspecified chronic kidney disease: Secondary | ICD-10-CM

## 2014-02-10 DIAGNOSIS — E039 Hypothyroidism, unspecified: Secondary | ICD-10-CM

## 2014-02-10 DIAGNOSIS — N039 Chronic nephritic syndrome with unspecified morphologic changes: Secondary | ICD-10-CM

## 2014-02-10 DIAGNOSIS — I509 Heart failure, unspecified: Secondary | ICD-10-CM

## 2014-02-10 DIAGNOSIS — N183 Chronic kidney disease, stage 3 unspecified: Secondary | ICD-10-CM

## 2014-03-11 ENCOUNTER — Other Ambulatory Visit: Payer: Self-pay | Admitting: *Deleted

## 2014-03-11 MED ORDER — HYDROCODONE-ACETAMINOPHEN 5-325 MG PO TABS: ORAL_TABLET | ORAL | Status: DC

## 2014-03-11 NOTE — Telephone Encounter (Signed)
[facility] 

## 2014-03-13 ENCOUNTER — Non-Acute Institutional Stay (SKILLED_NURSING_FACILITY): Payer: PRIVATE HEALTH INSURANCE | Admitting: Internal Medicine

## 2014-03-13 DIAGNOSIS — F329 Major depressive disorder, single episode, unspecified: Secondary | ICD-10-CM

## 2014-03-13 DIAGNOSIS — I482 Chronic atrial fibrillation, unspecified: Secondary | ICD-10-CM

## 2014-03-13 DIAGNOSIS — I4891 Unspecified atrial fibrillation: Secondary | ICD-10-CM

## 2014-03-13 DIAGNOSIS — F3289 Other specified depressive episodes: Secondary | ICD-10-CM

## 2014-03-13 DIAGNOSIS — E039 Hypothyroidism, unspecified: Secondary | ICD-10-CM

## 2014-03-13 DIAGNOSIS — F32A Depression, unspecified: Secondary | ICD-10-CM

## 2014-03-31 ENCOUNTER — Non-Acute Institutional Stay (SKILLED_NURSING_FACILITY): Payer: PRIVATE HEALTH INSURANCE | Admitting: Internal Medicine

## 2014-03-31 DIAGNOSIS — K5903 Drug induced constipation: Secondary | ICD-10-CM

## 2014-03-31 DIAGNOSIS — IMO0002 Reserved for concepts with insufficient information to code with codable children: Secondary | ICD-10-CM

## 2014-03-31 DIAGNOSIS — T402X5A Adverse effect of other opioids, initial encounter: Principal | ICD-10-CM

## 2014-03-31 DIAGNOSIS — E538 Deficiency of other specified B group vitamins: Secondary | ICD-10-CM

## 2014-03-31 DIAGNOSIS — T40605A Adverse effect of unspecified narcotics, initial encounter: Secondary | ICD-10-CM

## 2014-03-31 DIAGNOSIS — M1710 Unilateral primary osteoarthritis, unspecified knee: Secondary | ICD-10-CM

## 2014-03-31 DIAGNOSIS — K5909 Other constipation: Secondary | ICD-10-CM

## 2014-04-15 ENCOUNTER — Encounter (HOSPITAL_COMMUNITY): Payer: Self-pay | Admitting: Emergency Medicine

## 2014-04-15 ENCOUNTER — Emergency Department (HOSPITAL_COMMUNITY): Payer: PRIVATE HEALTH INSURANCE

## 2014-04-15 ENCOUNTER — Inpatient Hospital Stay (HOSPITAL_COMMUNITY)
Admission: EM | Admit: 2014-04-15 | Discharge: 2014-04-23 | DRG: 100 | Disposition: A | Discharge status: Skilled Nursing Facility | Payer: PRIVATE HEALTH INSURANCE | Attending: Internal Medicine | Admitting: Internal Medicine

## 2014-04-15 DIAGNOSIS — F339 Major depressive disorder, recurrent, unspecified: Secondary | ICD-10-CM

## 2014-04-15 DIAGNOSIS — F411 Generalized anxiety disorder: Secondary | ICD-10-CM | POA: Diagnosis present

## 2014-04-15 DIAGNOSIS — K219 Gastro-esophageal reflux disease without esophagitis: Secondary | ICD-10-CM | POA: Diagnosis present

## 2014-04-15 DIAGNOSIS — E46 Unspecified protein-calorie malnutrition: Secondary | ICD-10-CM

## 2014-04-15 DIAGNOSIS — Z87891 Personal history of nicotine dependence: Secondary | ICD-10-CM

## 2014-04-15 DIAGNOSIS — R1310 Dysphagia, unspecified: Secondary | ICD-10-CM | POA: Diagnosis present

## 2014-04-15 DIAGNOSIS — R4701 Aphasia: Secondary | ICD-10-CM | POA: Diagnosis present

## 2014-04-15 DIAGNOSIS — E43 Unspecified severe protein-calorie malnutrition: Secondary | ICD-10-CM | POA: Diagnosis present

## 2014-04-15 DIAGNOSIS — I739 Peripheral vascular disease, unspecified: Secondary | ICD-10-CM | POA: Diagnosis present

## 2014-04-15 DIAGNOSIS — K5900 Constipation, unspecified: Secondary | ICD-10-CM

## 2014-04-15 DIAGNOSIS — M47812 Spondylosis without myelopathy or radiculopathy, cervical region: Secondary | ICD-10-CM

## 2014-04-15 DIAGNOSIS — R471 Dysarthria and anarthria: Secondary | ICD-10-CM | POA: Diagnosis not present

## 2014-04-15 DIAGNOSIS — N3090 Cystitis, unspecified without hematuria: Secondary | ICD-10-CM

## 2014-04-15 DIAGNOSIS — A0472 Enterocolitis due to Clostridium difficile, not specified as recurrent: Secondary | ICD-10-CM | POA: Diagnosis not present

## 2014-04-15 DIAGNOSIS — B370 Candidal stomatitis: Secondary | ICD-10-CM | POA: Diagnosis not present

## 2014-04-15 DIAGNOSIS — Z7901 Long term (current) use of anticoagulants: Secondary | ICD-10-CM

## 2014-04-15 DIAGNOSIS — I509 Heart failure, unspecified: Secondary | ICD-10-CM | POA: Diagnosis present

## 2014-04-15 DIAGNOSIS — I10 Essential (primary) hypertension: Secondary | ICD-10-CM

## 2014-04-15 DIAGNOSIS — F0390 Unspecified dementia without behavioral disturbance: Secondary | ICD-10-CM

## 2014-04-15 DIAGNOSIS — Z681 Body mass index (BMI) 19 or less, adult: Secondary | ICD-10-CM

## 2014-04-15 DIAGNOSIS — G40401 Other generalized epilepsy and epileptic syndromes, not intractable, with status epilepticus: Principal | ICD-10-CM | POA: Diagnosis present

## 2014-04-15 DIAGNOSIS — H353190 Nonexudative age-related macular degeneration, unspecified eye, stage unspecified: Secondary | ICD-10-CM

## 2014-04-15 DIAGNOSIS — F329 Major depressive disorder, single episode, unspecified: Secondary | ICD-10-CM | POA: Diagnosis present

## 2014-04-15 DIAGNOSIS — E870 Hyperosmolality and hypernatremia: Secondary | ICD-10-CM | POA: Diagnosis not present

## 2014-04-15 DIAGNOSIS — Z66 Do not resuscitate: Secondary | ICD-10-CM | POA: Diagnosis present

## 2014-04-15 DIAGNOSIS — I4891 Unspecified atrial fibrillation: Secondary | ICD-10-CM

## 2014-04-15 DIAGNOSIS — D518 Other vitamin B12 deficiency anemias: Secondary | ICD-10-CM | POA: Diagnosis present

## 2014-04-15 DIAGNOSIS — A0471 Enterocolitis due to Clostridium difficile, recurrent: Secondary | ICD-10-CM

## 2014-04-15 DIAGNOSIS — I482 Chronic atrial fibrillation, unspecified: Secondary | ICD-10-CM

## 2014-04-15 DIAGNOSIS — I5033 Acute on chronic diastolic (congestive) heart failure: Secondary | ICD-10-CM | POA: Diagnosis present

## 2014-04-15 DIAGNOSIS — R569 Unspecified convulsions: Secondary | ICD-10-CM

## 2014-04-15 DIAGNOSIS — K5641 Fecal impaction: Secondary | ICD-10-CM

## 2014-04-15 DIAGNOSIS — D509 Iron deficiency anemia, unspecified: Secondary | ICD-10-CM | POA: Diagnosis present

## 2014-04-15 DIAGNOSIS — K2100 Gastro-esophageal reflux disease with esophagitis, without bleeding: Secondary | ICD-10-CM

## 2014-04-15 DIAGNOSIS — IMO0002 Reserved for concepts with insufficient information to code with codable children: Secondary | ICD-10-CM

## 2014-04-15 DIAGNOSIS — Z8673 Personal history of transient ischemic attack (TIA), and cerebral infarction without residual deficits: Secondary | ICD-10-CM | POA: Diagnosis not present

## 2014-04-15 DIAGNOSIS — J069 Acute upper respiratory infection, unspecified: Secondary | ICD-10-CM

## 2014-04-15 DIAGNOSIS — I6350 Cerebral infarction due to unspecified occlusion or stenosis of unspecified cerebral artery: Secondary | ICD-10-CM | POA: Diagnosis present

## 2014-04-15 DIAGNOSIS — M1990 Unspecified osteoarthritis, unspecified site: Secondary | ICD-10-CM | POA: Diagnosis present

## 2014-04-15 DIAGNOSIS — E876 Hypokalemia: Secondary | ICD-10-CM

## 2014-04-15 DIAGNOSIS — I872 Venous insufficiency (chronic) (peripheral): Secondary | ICD-10-CM

## 2014-04-15 DIAGNOSIS — F3289 Other specified depressive episodes: Secondary | ICD-10-CM | POA: Diagnosis present

## 2014-04-15 DIAGNOSIS — R197 Diarrhea, unspecified: Secondary | ICD-10-CM

## 2014-04-15 DIAGNOSIS — I4819 Other persistent atrial fibrillation: Secondary | ICD-10-CM

## 2014-04-15 DIAGNOSIS — Z880 Allergy status to penicillin: Secondary | ICD-10-CM

## 2014-04-15 DIAGNOSIS — E039 Hypothyroidism, unspecified: Secondary | ICD-10-CM | POA: Diagnosis present

## 2014-04-15 DIAGNOSIS — I160 Hypertensive urgency: Secondary | ICD-10-CM | POA: Diagnosis present

## 2014-04-15 DIAGNOSIS — R8281 Pyuria: Secondary | ICD-10-CM

## 2014-04-15 DIAGNOSIS — G40901 Epilepsy, unspecified, not intractable, with status epilepticus: Secondary | ICD-10-CM

## 2014-04-15 DIAGNOSIS — R627 Adult failure to thrive: Secondary | ICD-10-CM

## 2014-04-15 DIAGNOSIS — M898X9 Other specified disorders of bone, unspecified site: Secondary | ICD-10-CM

## 2014-04-15 DIAGNOSIS — N390 Urinary tract infection, site not specified: Secondary | ICD-10-CM

## 2014-04-15 DIAGNOSIS — M1710 Unilateral primary osteoarthritis, unspecified knee: Secondary | ICD-10-CM

## 2014-04-15 DIAGNOSIS — D649 Anemia, unspecified: Secondary | ICD-10-CM

## 2014-04-15 DIAGNOSIS — F32A Depression, unspecified: Secondary | ICD-10-CM

## 2014-04-15 DIAGNOSIS — N3020 Other chronic cystitis without hematuria: Secondary | ICD-10-CM

## 2014-04-15 DIAGNOSIS — R32 Unspecified urinary incontinence: Secondary | ICD-10-CM

## 2014-04-15 DIAGNOSIS — M159 Polyosteoarthritis, unspecified: Secondary | ICD-10-CM

## 2014-04-15 DIAGNOSIS — Z8679 Personal history of other diseases of the circulatory system: Secondary | ICD-10-CM

## 2014-04-15 DIAGNOSIS — J90 Pleural effusion, not elsewhere classified: Secondary | ICD-10-CM

## 2014-04-15 DIAGNOSIS — E162 Hypoglycemia, unspecified: Secondary | ICD-10-CM | POA: Diagnosis not present

## 2014-04-15 DIAGNOSIS — R609 Edema, unspecified: Secondary | ICD-10-CM

## 2014-04-15 LAB — URINALYSIS, ROUTINE W REFLEX MICROSCOPIC
BILIRUBIN URINE: NEGATIVE
Glucose, UA: NEGATIVE mg/dL
Ketones, ur: NEGATIVE mg/dL
Nitrite: NEGATIVE
Protein, ur: 30 mg/dL — AB
Specific Gravity, Urine: 1.013 (ref 1.005–1.030)
UROBILINOGEN UA: 0.2 mg/dL (ref 0.0–1.0)
pH: 5 (ref 5.0–8.0)

## 2014-04-15 LAB — DIFFERENTIAL
BASOS ABS: 0.1 10*3/uL (ref 0.0–0.1)
BASOS PCT: 1 % (ref 0–1)
EOS ABS: 0.2 10*3/uL (ref 0.0–0.7)
EOS PCT: 4 % (ref 0–5)
Lymphocytes Relative: 26 % (ref 12–46)
Lymphs Abs: 1.8 10*3/uL (ref 0.7–4.0)
MONO ABS: 0.6 10*3/uL (ref 0.1–1.0)
Monocytes Relative: 9 % (ref 3–12)
Neutro Abs: 4.2 10*3/uL (ref 1.7–7.7)
Neutrophils Relative %: 60 % (ref 43–77)

## 2014-04-15 LAB — APTT: aPTT: 28 seconds (ref 24–37)

## 2014-04-15 LAB — URINE MICROSCOPIC-ADD ON

## 2014-04-15 LAB — CBC
HEMATOCRIT: 43.9 % (ref 36.0–46.0)
Hemoglobin: 13.6 g/dL (ref 12.0–15.0)
MCH: 26.5 pg (ref 26.0–34.0)
MCHC: 31 g/dL (ref 30.0–36.0)
MCV: 85.4 fL (ref 78.0–100.0)
Platelets: 190 10*3/uL (ref 150–400)
RBC: 5.14 MIL/uL — ABNORMAL HIGH (ref 3.87–5.11)
RDW: 17.3 % — AB (ref 11.5–15.5)
WBC: 6.8 10*3/uL (ref 4.0–10.5)

## 2014-04-15 LAB — COMPREHENSIVE METABOLIC PANEL
ALBUMIN: 4.1 g/dL (ref 3.5–5.2)
ALT: 16 U/L (ref 0–35)
AST: 22 U/L (ref 0–37)
Alkaline Phosphatase: 91 U/L (ref 39–117)
BUN: 27 mg/dL — ABNORMAL HIGH (ref 6–23)
CO2: 25 mEq/L (ref 19–32)
CREATININE: 0.91 mg/dL (ref 0.50–1.10)
Calcium: 10.1 mg/dL (ref 8.4–10.5)
Chloride: 98 mEq/L (ref 96–112)
GFR calc Af Amer: 64 mL/min — ABNORMAL LOW (ref >90)
GFR calc non Af Amer: 55 mL/min — ABNORMAL LOW (ref >90)
Glucose, Bld: 110 mg/dL — ABNORMAL HIGH (ref 70–99)
Potassium: 4.3 mEq/L (ref 3.7–5.3)
Sodium: 141 mEq/L (ref 137–147)
TOTAL PROTEIN: 8 g/dL (ref 6.0–8.3)
Total Bilirubin: 0.5 mg/dL (ref 0.3–1.2)

## 2014-04-15 LAB — ETHANOL

## 2014-04-15 LAB — RAPID URINE DRUG SCREEN, HOSP PERFORMED
Amphetamines: NOT DETECTED
BARBITURATES: NOT DETECTED
BENZODIAZEPINES: NOT DETECTED
COCAINE: NOT DETECTED
Opiates: NOT DETECTED
TETRAHYDROCANNABINOL: NOT DETECTED

## 2014-04-15 LAB — I-STAT CHEM 8, ED
BUN: 28 mg/dL — AB (ref 6–23)
Calcium, Ion: 1.22 mmol/L (ref 1.13–1.30)
Chloride: 102 mEq/L (ref 96–112)
Creatinine, Ser: 1.1 mg/dL (ref 0.50–1.10)
Glucose, Bld: 108 mg/dL — ABNORMAL HIGH (ref 70–99)
HCT: 46 % (ref 36.0–46.0)
Hemoglobin: 15.6 g/dL — ABNORMAL HIGH (ref 12.0–15.0)
Potassium: 4.2 mEq/L (ref 3.7–5.3)
SODIUM: 140 meq/L (ref 137–147)
TCO2: 25 mmol/L (ref 0–100)

## 2014-04-15 LAB — I-STAT TROPONIN, ED: TROPONIN I, POC: 0.01 ng/mL (ref 0.00–0.08)

## 2014-04-15 LAB — PROTIME-INR
INR: 1.34 (ref 0.00–1.49)
Prothrombin Time: 16.6 seconds — ABNORMAL HIGH (ref 11.6–15.2)

## 2014-04-15 LAB — MRSA PCR SCREENING: MRSA BY PCR: NEGATIVE

## 2014-04-15 MED ORDER — HYPROMELLOSE (GONIOSCOPIC) 2.5 % OP SOLN
1.0000 [drp] | Freq: Four times a day (QID) | OPHTHALMIC | Status: DC | PRN
  Filled 2014-04-15: qty 15

## 2014-04-15 MED ORDER — ACETAMINOPHEN 650 MG RE SUPP
650.0000 mg | Freq: Four times a day (QID) | RECTAL | Status: DC | PRN
  Filled 2014-04-15: qty 1

## 2014-04-15 MED ORDER — FLUTICASONE PROPIONATE 50 MCG/ACT NA SUSP
1.0000 | Freq: Every day | NASAL | Status: DC
  Administered 2014-04-18 – 2014-04-23 (×5): 1 via NASAL
  Filled 2014-04-15 (×2): qty 16

## 2014-04-15 MED ORDER — SODIUM CHLORIDE 0.9 % IV SOLN: 500.0000 mg | Freq: Two times a day (BID) | INTRAVENOUS | Status: DC

## 2014-04-15 MED ORDER — LABETALOL HCL 5 MG/ML IV SOLN
10.0000 mg | Freq: Once | INTRAVENOUS | Status: AC
  Administered 2014-04-15: 10 mg via INTRAVENOUS
  Filled 2014-04-15 (×2): qty 4

## 2014-04-15 MED ORDER — SODIUM CHLORIDE 0.9 % IV SOLN
500.0000 mg | Freq: Two times a day (BID) | INTRAVENOUS | Status: DC
  Administered 2014-04-15 – 2014-04-17 (×4): 500 mg via INTRAVENOUS
  Filled 2014-04-15 (×5): qty 5

## 2014-04-15 MED ORDER — PANTOPRAZOLE SODIUM 40 MG IV SOLR
40.0000 mg | Freq: Every day | INTRAVENOUS | Status: DC
  Administered 2014-04-15 – 2014-04-19 (×5): 40 mg via INTRAVENOUS
  Filled 2014-04-15 (×5): qty 40

## 2014-04-15 MED ORDER — CIPROFLOXACIN IN D5W 200 MG/100ML IV SOLN
200.0000 mg | Freq: Two times a day (BID) | INTRAVENOUS | Status: DC
  Administered 2014-04-15: 200 mg via INTRAVENOUS
  Filled 2014-04-15 (×3): qty 100

## 2014-04-15 MED ORDER — ONDANSETRON HCL 4 MG/2ML IJ SOLN
4.0000 mg | Freq: Four times a day (QID) | INTRAMUSCULAR | Status: DC | PRN
  Filled 2014-04-15: qty 2

## 2014-04-15 MED ORDER — SODIUM CHLORIDE 0.9 % IV SOLN
INTRAVENOUS | Status: DC
Start: 2014-04-15 — End: 2014-04-17
  Administered 2014-04-15 – 2014-04-16 (×2): via INTRAVENOUS

## 2014-04-15 MED ORDER — ACETAMINOPHEN 325 MG PO TABS: 650.0000 mg | ORAL_TABLET | Freq: Four times a day (QID) | ORAL | Status: DC | PRN

## 2014-04-15 MED ORDER — ONDANSETRON HCL 4 MG PO TABS: 4.0000 mg | ORAL_TABLET | Freq: Four times a day (QID) | ORAL | Status: DC | PRN

## 2014-04-15 MED ORDER — LEVOTHYROXINE SODIUM 100 MCG IV SOLR
100.0000 ug | Freq: Every day | INTRAVENOUS | Status: DC
  Administered 2014-04-16 – 2014-04-19 (×4): 100 ug via INTRAVENOUS
  Filled 2014-04-15 (×5): qty 5

## 2014-04-15 MED ORDER — CITALOPRAM HYDROBROMIDE 20 MG PO TABS
20.0000 mg | ORAL_TABLET | Freq: Every day | ORAL | Status: DC
  Administered 2014-04-17: 20 mg via ORAL
  Filled 2014-04-15 (×3): qty 1

## 2014-04-15 MED ORDER — METOPROLOL TARTRATE 1 MG/ML IV SOLN
2.5000 mg | Freq: Three times a day (TID) | INTRAVENOUS | Status: DC
  Administered 2014-04-15 – 2014-04-16 (×2): 2.5 mg via INTRAVENOUS
  Filled 2014-04-15 (×5): qty 5

## 2014-04-15 MED ORDER — SODIUM CHLORIDE 0.9 % IV SOLN
1000.0000 mg | INTRAVENOUS | Status: AC
  Administered 2014-04-15: 1000 mg via INTRAVENOUS
  Filled 2014-04-15: qty 10

## 2014-04-15 MED ORDER — RIVAROXABAN 15 MG PO TABS
15.0000 mg | ORAL_TABLET | Freq: Every day | ORAL | Status: DC
  Filled 2014-04-15 (×2): qty 1

## 2014-04-15 MED ORDER — LORAZEPAM 2 MG/ML IJ SOLN
INTRAMUSCULAR | Status: AC
  Administered 2014-04-15: 1 mg
  Filled 2014-04-15: qty 1

## 2014-04-15 MED ORDER — BISACODYL 10 MG RE SUPP
10.0000 mg | RECTAL | Status: DC | PRN
  Administered 2014-04-19: 10 mg via RECTAL
  Filled 2014-04-15: qty 1

## 2014-04-15 MED ORDER — IPRATROPIUM-ALBUTEROL 0.5-2.5 (3) MG/3ML IN SOLN: 3.0000 mL | Freq: Four times a day (QID) | RESPIRATORY_TRACT | Status: DC | PRN

## 2014-04-15 NOTE — Code Documentation (Signed)
80yo [facility].  Patient slumped to the right in her wheelchair at the nursing home.  She had a left gaze per EMS.  She began posturing with EMS and was in afib.  H/o afib on Xarelto.  Patient with dementia per patient's son, but able to communicate, needs help with ADLs. Patient cleared by EDP at the bridge and taken to CT.  Dr. Roseanne RenoStewart at bedside and read CT.  Patient back to Trauma C.  Patient began seizing during exam.  Order for Ativan 1mg  IVP which was given by ED RN.  PIV infiltrated with bruising.  Stroke RN removed PIV per MD and held pressure and PA applied pressure bandage.  Pharmacy notified and will call ED RN regarding any intervention needed for medication infiltration.  Seizures subsided.  Keppra IV ordered by MD.  Patient hypertensive and treated with Labetalol IVP.  NIHSS 24, see documentation for details and times.  Patient contraindicated for treatment with tPA d/t Xarelto.  No acute stroke treatment at this time.  Code stroke canceled by Dr. Roseanne RenoStewart at [phone].  Bedside handoff with ED RN Darnelle CatalanMalinda.

## 2014-04-15 NOTE — ED Notes (Addendum)
[facility]. EMS reports patient was sitting in a chair being fed at 12:30 today. EMS endorses staff endorses patient slumped over to the right and was looking left, unable to verbalize. Shortly after, patient began posturing.  Normally the patient is alert and verbal.

## 2014-04-15 NOTE — H&P (Signed)
[facility] History and Physical  [name] BeringDorothy Mcquitty ZOX:[id number] DOB: [birth year] DOA: 04/15/2014  Referring physician: EDP PCP: Oneal GroutPANDEY, MAHIMA, MD   Chief Complaint: jerking per family, Unresponsiveness  HPI: [name] [name] is a 80 y.o. female SNF resident with multiple medical problems as listed below including CVA/TIA, Afib on xarelto, HTN, Dementia, Hypothyroidism who presents with the above complaints. Per family pt was found slumped to right in her wheelchair at SNF, and son relates that she had jerking activity. When EMS arrived they reported that pt's eyes were gazing to the L. and she was unresponsive. Code stroke was called prior to arrival. Upon arrival she was found to be markedly elevated BPS, and tachy up to 14Os per EDP and she was given IV labetalol. Also while in ED she had a grandmal seizure>> she was given IV ativan and then loaded with IV keppra per Neuro, and pt has not had further seizures. At the time of my exam, pt is postictal. Head CT neg for acute findings, severe chronic involutional change noted.    Review of Systems As per HPI, otherwise unobtainable.  Past Medical History  Diagnosis Date  . Renal insufficiency   . Hypertension   . A-fib   . Anxiety   . Depression   . Hypothyroidism   . Inguinal hernia     repaired 2011, right  . Osteoarthritis   . Cerebral vascular accident   . DJD (degenerative joint disease)   . Recurrent UTI   . C. difficile colitis     recurrent  . Dementia   . Anemia   . Cataract   . Asteatotic eczema   . Peripheral vascular disease   . Hyponatremia   . Malnutrition   . Sepsis(995.91)   . TIA (transient ischemic attack)   . Urinary incontinence    Past Surgical History  Procedure Laterality Date  . Inguinal hernia repair    . Bladder surgery      prolapsed bladder  . Cataract extraction      bilateral   Social History:  reports that she quit smoking about 53 years ago. Her smoking use included Cigarettes. She  smoked 0.00 packs per day. She has never used smokeless tobacco. She reports that she does not drink alcohol or use illicit drugs.  Allergies  Allergen Reactions  . Penicillins     "red faced" and on MAR    Family History  Problem Relation Age of Onset  . Kidney Stones Mother   . Colon cancer Neg Hx      Prior to Admission medications   Medication Sig Start Date End Date Taking? Authorizing Amabel Stmarie  acetaminophen (TYLENOL) 500 MG tablet Take 500 mg by mouth 2 (two) times daily.   Yes Historical Charnita Trudel, MD  bisacodyl (DULCOLAX) 10 MG suppository Place 10 mg rectally See admin instructions. Every 3 days as needed for no bowl movement   Yes Historical Adlynn Lowenstein, MD  citalopram (CELEXA) 20 MG tablet Take 20 mg by mouth daily.     Yes Historical Kylee Nardozzi, MD  diltiazem (DILACOR XR) 120 MG 24 hr capsule Take 120 mg by mouth daily.   Yes Historical Mclain Freer, MD  fluticasone (FLONASE) 50 MCG/ACT nasal spray Place 1 spray into the nose at bedtime.    Yes Historical Graydon Fofana, MD  furosemide (LASIX) 40 MG tablet Take 40 mg by mouth 2 (two) times daily.   Yes Historical Shannia Jacuinde, MD  HYDROcodone-acetaminophen (NORCO/VICODIN) 5-325 MG per tablet Take two tablets by mouth every morning. Take  one tablet by mouth every 8 hours as needed for OA/Pain 03/11/14  Yes Kimber RelicArthur G Green, MD  hydroxypropyl methylcellulose (ISOPTO TEARS) 2.5 % ophthalmic solution Place 1 drop into both eyes 4 (four) times daily as needed.   Yes Historical Sharleen Szczesny, MD  ipratropium-albuterol (DUONEB) 0.5-2.5 (3) MG/3ML SOLN Take 3 mLs by nebulization every 6 (six) hours as needed (for congestion).   Yes Historical Lanard Arguijo, MD  iron polysaccharides (NIFEREX) 150 MG capsule Take 150 mg by mouth 2 (two) times daily.   Yes Historical Ervin Hensley, MD  levothyroxine (SYNTHROID, LEVOTHROID) 200 MCG tablet Take 200 mcg by mouth daily before breakfast.   Yes Historical Jaylan Duggar, MD  Multiple Vitamins-Minerals (PRESERVISION AREDS 2 PO) Take 1  capsule by mouth 2 (two) times daily.   Yes Historical Ashur Glatfelter, MD  omeprazole (PRILOSEC) 40 MG capsule Take 40 mg by mouth daily.   Yes Historical Demetris Meinhardt, MD  Polyethylene Glycol 3350 (PEG 3350) POWD Take 17 g by mouth daily.   Yes Historical Admiral Marcucci, MD  potassium chloride SA (K-DUR,KLOR-CON) 20 MEQ tablet Take 20 mEq by mouth daily.   Yes Historical Callaway Hardigree, MD  Protein (PROCEL) POWD Take 1 scoop by mouth 3 (three) times daily.   Yes Historical Jenean Escandon, MD  Rivaroxaban (XARELTO) 15 MG TABS tablet Take 15 mg by mouth daily.     Yes Historical Aharon Carriere, MD  sennosides-docusate sodium (SENOKOT-S) 8.6-50 MG tablet Take 1 tablet by mouth 2 (two) times daily.   Yes Historical Tamkia Temples, MD  vitamin B-12 (CYANOCOBALAMIN) 1000 MCG tablet Take 1,000 mcg by mouth daily.   Yes Historical Ranada Vigorito, MD   Physical Exam: Filed Vitals:   04/15/14 1530  BP: 124/73  Pulse: 91  Temp:   Resp: 18    BP 124/73  Pulse 91  Temp(Src) 97.7 F (36.5 C) (Rectal)  Resp 18  SpO2 94% Constitutional: Vital signs reviewed.  Patient is a well-developed and well-nourished  in no acute distress and cooperative with exam. unresponsive to deep touch. Head: Normocephalic and atraumatic Mouth: no erythema or exudates, MMM Eyes: PERRL, EOMI, conjunctivae normal, No scleral icterus.  Neck: Supple, Trachea midline normal ROM, No JVD, mass, thyromegaly, or carotid bruit present.  Cardiovascular: Irreg, rate controlled, S1 normal, S2 normal, no MRG, pulses symmetric and intact bilaterally Pulmonary/Chest: normal respiratory effort, CTAB, no wheezes, rales, or rhonchi Abdominal: Soft. Non-tender, non-distended, bowel sounds are normal, no masses, organomegaly, or guarding present.  GU: no CVA tenderness Extremities: No cyanosis, no edema, ROM full and no nontender  Neurological: unresponsive to deep touch, cranial nerve II-XII are grossly intact, non focal  Skin: Warm, dry and intact. No rash, cyanosis, or clubbing.                 Labs on Admission:  Basic Metabolic Panel:  Recent Labs Lab 04/15/14 1344 04/15/14 1400  NA 141 140  K 4.3 4.2  CL 98 102  CO2 25  --   GLUCOSE 110* 108*  BUN 27* 28*  CREATININE 0.91 1.10  CALCIUM 10.1  --    Liver Function Tests:  Recent Labs Lab 04/15/14 1344  AST 22  ALT 16  ALKPHOS 91  BILITOT 0.5  PROT 8.0  ALBUMIN 4.1   No results found for this basename: LIPASE, AMYLASE,  in the last 168 hours No results found for this basename: AMMONIA,  in the last 168 hours CBC:  Recent Labs Lab 04/15/14 1344 04/15/14 1400  WBC 6.8  --   NEUTROABS 4.2  --  HGB 13.6 15.6*  HCT 43.9 46.0  MCV 85.4  --   PLT 190  --    Cardiac Enzymes: No results found for this basename: CKTOTAL, CKMB, CKMBINDEX, TROPONINI,  in the last 168 hours  BNP (last 3 results) No results found for this basename: PROBNP,  in the last 8760 hours CBG: No results found for this basename: GLUCAP,  in the last 168 hours  Radiological Exams on Admission: Ct Head Wo Contrast  04/15/2014   CLINICAL DATA:  Code stroke, posturing, left gaze  EXAM: CT HEAD WITHOUT CONTRAST  TECHNIQUE: Contiguous axial images were obtained from the base of the skull through the vertex without intravenous contrast.  COMPARISON:  02/15/2012  FINDINGS: Severe diffuse cortical atrophy and severe low-attenuation diffusely in the deep white matter. Sub cm lacunar infarct left thalamus. Sub cm lacunar infarcts adjacent to 1 another in the inferior left cerebellar hemisphere, stable. Sub cm lacunar infarct deep right periventricular white matter, stable. No evidence of vascular territory infarct. No hemorrhage or extra axial fluid. No hydrocephalus or evidence of mass. Calvarium is intact.  IMPRESSION: Severe chronic involutional change with no acute findings. Critical Value/emergent results were called by telephone at the time of interpretation on 04/15/2014 at 1:58 PM to Dr. Roseanne Reno , who verbally acknowledged  these results.   Electronically Signed   By: Esperanza Heir M.D.   On: 04/15/2014 13:58    EKG: Independently reviewed. Afib with RVR at 174, no acute ischemic changes  Assessment/Plan Active Problems: Seizures, New onset -As discussed above, head CT neg for acute findings -continue s/p ativan and IV keppra load in ED, will continue iv Keppra Q12 as neuro recs -obtain EEG and MRI -Admit to step down unit for close monitoring -empiric abx for probable UTI>> follow cultures   Atrial fibrillation RVR -HR down to 90s after iv labetalol in ED -For now will start scheduled IV metoprolol till pt able to take po -continue  xarelto Probable UTI -As above, empirc abx, obtain urine culture and follow   HYPOTHYROIDISM -iv synthroid levothyroxine for now till able to tolerate PO   GERD (gastroesophageal reflux disease)   -place PPI Malignant HTN -iv metoprolol for now as above, monitor and further treat as appropriate Protein malnutrition -Follow and consult dietician when able to take POs H/O Depression -follow resume celexa when able to tolerate  Dementia Code Status- Pt is DNR   Code Status: DNR Family Communication: son and daughter at bedside Disposition Plan: admit to step down  Time spent: >30  Kela Millin [facility] Pager [phone]

## 2014-04-15 NOTE — ED Provider Notes (Signed)
CSN: [id number]     Arrival date & time 04/15/14  1340 History   First MD Initiated Contact with Patient 04/15/14 1413     No chief complaint on file.  Level V caveat unresponsive., Dementia  (Consider location/radiation/quality/duration/timing/severity/associated sxs/prior Treatment) HPI Patient brought by EMS and found unresponsive at skilled nursing facility. With rightward gaze. No treatment prior to coming here. Code stroke on the prehospital setting Past Medical History  Diagnosis Date  . Renal insufficiency   . Hypertension   . A-fib   . Anxiety   . Depression   . Hypothyroidism   . Inguinal hernia     repaired 2011, right  . Osteoarthritis   . Cerebral vascular accident   . DJD (degenerative joint disease)   . Recurrent UTI   . C. difficile colitis     recurrent  . Dementia   . Anemia   . Cataract   . Asteatotic eczema   . Peripheral vascular disease   . Hyponatremia   . Malnutrition   . Sepsis(995.91)   . TIA (transient ischemic attack)   . Urinary incontinence    Past Surgical History  Procedure Laterality Date  . Inguinal hernia repair    . Bladder surgery      prolapsed bladder  . Cataract extraction      bilateral   Family History  Problem Relation Age of Onset  . Kidney Stones Mother   . Colon cancer Neg Hx    History  Substance Use Topics  . Smoking status: Former Smoker    Types: Cigarettes    Quit date: 10/17/1960  . Smokeless tobacco: Never Used  . Alcohol Use: No   DO NOT RESUSCITATE CODE STATUS OB History   Grav Para Term Preterm Abortions TAB SAB Ect Mult Living                 Review of Systems  Unable to perform ROS: Dementia      Allergies  Penicillins  Home Medications   Prior to Admission medications   Medication Sig Start Date End Date Taking? Authorizing Thanh Pomerleau  citalopram (CELEXA) 20 MG tablet Take 20 mg by mouth daily.      Historical Madeleine Fenn, MD  diltiazem (DILACOR XR) 240 MG 24 hr capsule Take 120 mg by  mouth daily.  09/20/11   Edsel Petrin, DO  fluticasone (FLONASE) 50 MCG/ACT nasal spray Place 1 spray into the nose at bedtime.     Historical Jurni Cesaro, MD  furosemide (LASIX) 80 MG tablet Take 60 mg by mouth 2 (two) times daily.  10/24/11   Mathis Dad, MD  HYDROcodone-acetaminophen (NORCO/VICODIN) 5-325 MG per tablet Take two tablets by mouth every morning. Take one tablet by mouth every 8 hours as needed for OA/Pain 03/11/14   Kimber Relic, MD  hydroxypropyl methylcellulose (ISOPTO TEARS) 2.5 % ophthalmic solution Place 1 drop into both eyes 4 (four) times daily as needed.    Historical Gorman Safi, MD  iron polysaccharides (NIFEREX) 150 MG capsule Take 150 mg by mouth 2 (two) times daily.    Historical Ahsley Attwood, MD  KLOR-CON M20 20 MEQ tablet Take 1 tablet by mouth 2 (two) times daily. 09/18/11   Historical Shelvy Perazzo, MD  levothyroxine (SYNTHROID, LEVOTHROID) 100 MCG tablet Take 225 mcg by mouth daily.     Historical Brittney Mucha, MD  mirtazapine (REMERON) 15 MG tablet Take 7.5 mg by mouth at bedtime.     Historical Rowen Wilmer, MD  omeprazole (PRILOSEC) 20 MG capsule Take  40 mg by mouth daily.     Historical Harlis Champoux, MD  Rivaroxaban (XARELTO) 15 MG TABS tablet Take 15 mg by mouth daily.      Historical Oley Lahaie, MD  saccharomyces boulardii (FLORASTOR) 250 MG capsule Take 250 mg by mouth 2 (two) times daily.    Historical Vali Capano, MD  sennosides-docusate sodium (SENOKOT-S) 8.6-50 MG tablet Take 1 tablet by mouth 2 (two) times daily.    Historical Kenyan Karnes, MD  sulfamethoxazole-trimethoprim (BACTRIM DS) 800-160 MG per tablet Take 1 tablet by mouth daily.    Historical Laddie Math, MD  vitamin B-12 (CYANOCOBALAMIN) 1000 MCG tablet Take 1,000 mcg by mouth daily.    Historical Murlin Schrieber, MD   BP 192/141  Resp 25  SpO2 98% Physical Exam  Nursing note and vitals reviewed. Constitutional: She appears well-developed and well-nourished.  Chronically ill-appearing, unresponsive  HENT:  Head: Normocephalic  and atraumatic.  Eyes: Conjunctivae are normal. Pupils are equal, round, and reactive to light.  Neck: Neck supple. No tracheal deviation present. No thyromegaly present.  Cardiovascular:  No murmur heard. Tachycardic irregularly irregular  Pulmonary/Chest: Effort normal and breath sounds normal.  Abdominal: Soft. Bowel sounds are normal. She exhibits no distension. There is no tenderness.  Musculoskeletal: Normal range of motion. She exhibits no edema and no tenderness.  Neurological: Coordination normal.  No spontaneous movement of her extremities.  Skin: Skin is warm and dry. No rash noted.  Psychiatric: She has a normal mood and affect.    ED Course  Procedures (including critical care time) Labs Review Labs Reviewed  CBC - Abnormal; Notable for the following:    RBC 5.14 (*)    RDW 17.3 (*)    All other components within normal limits  I-STAT CHEM 8, ED - Abnormal; Notable for the following:    BUN 28 (*)    Glucose, Bld 108 (*)    Hemoglobin 15.6 (*)    All other components within normal limits  DIFFERENTIAL  ETHANOL  PROTIME-INR  APTT  COMPREHENSIVE METABOLIC PANEL  URINE RAPID DRUG SCREEN (HOSP PERFORMED)  URINALYSIS, ROUTINE W REFLEX MICROSCOPIC  I-STAT TROPOININ, ED  I-STAT TROPOININ, ED    Imaging Review Ct Head Wo Contrast  04/15/2014   CLINICAL DATA:  Code stroke, posturing, left gaze  EXAM: CT HEAD WITHOUT CONTRAST  TECHNIQUE: Contiguous axial images were obtained from the base of the skull through the vertex without intravenous contrast.  COMPARISON:  02/15/2012  FINDINGS: Severe diffuse cortical atrophy and severe low-attenuation diffusely in the deep white matter. Sub cm lacunar infarct left thalamus. Sub cm lacunar infarcts adjacent to 1 another in the inferior left cerebellar hemisphere, stable. Sub cm lacunar infarct deep right periventricular white matter, stable. No evidence of vascular territory infarct. No hemorrhage or extra axial fluid. No  hydrocephalus or evidence of mass. Calvarium is intact.  IMPRESSION: Severe chronic involutional change with no acute findings. Critical Value/emergent results were called by telephone at the time of interpretation on 04/15/2014 at 1:58 PM to Dr. Roseanne RenoStewart , who verbally acknowledged these results.   Electronically Signed   By: Esperanza Heiraymond  Rubner M.D.   On: 04/15/2014 13:58     EKG Interpretation   Date/Time:  Tuesday April 15 2014 14:01:02 EDT Ventricular Rate:  174 PR Interval:    QRS Duration: 96 QT Interval:  258 QTC Calculation: 439 R Axis:   75 Text Interpretation:  Atrial fibrillation with rapid V-rate Probable  anteroseptal infarct, old Repolarization abnormality, prob rate related  Artifact in lead(s) I  III aVL aVF SINCE LAST TRACING HEART RATE HAS  INCREASED Confirmed by Ethelda Chick  MD, SAM [phone]) on 04/15/2014 2:21:17 PM     Approximately 10 PM patient had generalized seizure lasting 5 minutes with rightward gaze of her eyes. Seizures stopped after she received Ativan intravenously. She may have gotten some of Ativan subcutaneously as her IV infiltrated. A nasal trumpet was inserted by me. At 3:45 PM patient remains unresponsive. Nasal trumpet in place. Results for orders placed during the hospital encounter of 04/15/14  ETHANOL      Result Value Ref Range   Alcohol, Ethyl (B) <11  0 - 11 mg/dL  PROTIME-INR      Result Value Ref Range   Prothrombin Time 16.6 (*) 11.6 - 15.2 seconds   INR 1.34  0.00 - 1.49  APTT      Result Value Ref Range   aPTT 28  24 - 37 seconds  CBC      Result Value Ref Range   WBC 6.8  4.0 - 10.5 K/uL   RBC 5.14 (*) 3.87 - 5.11 MIL/uL   Hemoglobin 13.6  12.0 - 15.0 g/dL   HCT 19.1  47.8 - 29.5 %   MCV 85.4  78.0 - 100.0 fL   MCH 26.5  26.0 - 34.0 pg   MCHC 31.0  30.0 - 36.0 g/dL   RDW 62.1 (*) 30.8 - 65.7 %   Platelets 190  150 - 400 K/uL  DIFFERENTIAL      Result Value Ref Range   Neutrophils Relative % 60  43 - 77 %   Neutro Abs 4.2  1.7 -  7.7 K/uL   Lymphocytes Relative 26  12 - 46 %   Lymphs Abs 1.8  0.7 - 4.0 K/uL   Monocytes Relative 9  3 - 12 %   Monocytes Absolute 0.6  0.1 - 1.0 K/uL   Eosinophils Relative 4  0 - 5 %   Eosinophils Absolute 0.2  0.0 - 0.7 K/uL   Basophils Relative 1  0 - 1 %   Basophils Absolute 0.1  0.0 - 0.1 K/uL  COMPREHENSIVE METABOLIC PANEL      Result Value Ref Range   Sodium 141  137 - 147 mEq/L   Potassium 4.3  3.7 - 5.3 mEq/L   Chloride 98  96 - 112 mEq/L   CO2 25  19 - 32 mEq/L   Glucose, Bld 110 (*) 70 - 99 mg/dL   BUN 27 (*) 6 - 23 mg/dL   Creatinine, Ser 8.46  0.50 - 1.10 mg/dL   Calcium 96.2  8.4 - 95.2 mg/dL   Total Protein 8.0  6.0 - 8.3 g/dL   Albumin 4.1  3.5 - 5.2 g/dL   AST 22  0 - 37 U/L   ALT 16  0 - 35 U/L   Alkaline Phosphatase 91  39 - 117 U/L   Total Bilirubin 0.5  0.3 - 1.2 mg/dL   GFR calc non Af Amer 55 (*) >90 mL/min   GFR calc Af Amer 64 (*) >90 mL/min  URINE RAPID DRUG SCREEN (HOSP PERFORMED)      Result Value Ref Range   Opiates NONE DETECTED  NONE DETECTED   Cocaine NONE DETECTED  NONE DETECTED   Benzodiazepines NONE DETECTED  NONE DETECTED   Amphetamines NONE DETECTED  NONE DETECTED   Tetrahydrocannabinol NONE DETECTED  NONE DETECTED   Barbiturates NONE DETECTED  NONE DETECTED  URINALYSIS, ROUTINE W REFLEX MICROSCOPIC  Result Value Ref Range   Color, Urine YELLOW  YELLOW   APPearance CLOUDY (*) CLEAR   Specific Gravity, Urine 1.013  1.005 - 1.030   pH 5.0  5.0 - 8.0   Glucose, UA NEGATIVE  NEGATIVE mg/dL   Hgb urine dipstick SMALL (*) NEGATIVE   Bilirubin Urine NEGATIVE  NEGATIVE   Ketones, ur NEGATIVE  NEGATIVE mg/dL   Protein, ur 30 (*) NEGATIVE mg/dL   Urobilinogen, UA 0.2  0.0 - 1.0 mg/dL   Nitrite NEGATIVE  NEGATIVE   Leukocytes, UA LARGE (*) NEGATIVE  URINE MICROSCOPIC-ADD ON      Result Value Ref Range   Squamous Epithelial / LPF FEW (*) RARE   WBC, UA TOO NUMEROUS TO COUNT  <3 WBC/hpf   RBC / HPF 0-2  <3 RBC/hpf   Bacteria, UA FEW  (*) RARE  I-STAT CHEM 8, ED      Result Value Ref Range   Sodium 140  137 - 147 mEq/L   Potassium 4.2  3.7 - 5.3 mEq/L   Chloride 102  96 - 112 mEq/L   BUN 28 (*) 6 - 23 mg/dL   Creatinine, Ser 5.401.10  0.50 - 1.10 mg/dL   Glucose, Bld [id number] (*) 70 - 99 mg/dL   Calcium, Ion 1.911.22  4.781.13 - 1.30 mmol/L   TCO2 25  0 - 100 mmol/L   Hemoglobin 15.6 (*) 12.0 - 15.0 g/dL   HCT 29.546.0  62.136.0 - 30.846.0 %  I-STAT TROPOININ, ED      Result Value Ref Range   Troponin i, poc 0.01  0.00 - 0.08 ng/mL   Comment 3            Ct Head Wo Contrast  04/15/2014   CLINICAL DATA:  Code stroke, posturing, left gaze  EXAM: CT HEAD WITHOUT CONTRAST  TECHNIQUE: Contiguous axial images were obtained from the base of the skull through the vertex without intravenous contrast.  COMPARISON:  02/15/2012  FINDINGS: Severe diffuse cortical atrophy and severe low-attenuation diffusely in the deep white matter. Sub cm lacunar infarct left thalamus. Sub cm lacunar infarcts adjacent to 1 another in the inferior left cerebellar hemisphere, stable. Sub cm lacunar infarct deep right periventricular white matter, stable. No evidence of vascular territory infarct. No hemorrhage or extra axial fluid. No hydrocephalus or evidence of mass. Calvarium is intact.  IMPRESSION: Severe chronic involutional change with no acute findings. Critical Value/emergent results were called by telephone at the time of interpretation on 04/15/2014 at 1:58 PM to Dr. Roseanne RenoStewart , who verbally acknowledged these results.   Electronically Signed   By: Esperanza Heiraymond  Rubner M.D.   On: 04/15/2014 13:58    MDM  I have discussed with him. Patient is a DO NOT RESUSCITATE. CODE STATUS.Marland Kitchen. Patient loaded with Keppra intravenously. Patient noted to be in atrial fibrillation with rapid ventricular response. Received labetalol 10 mg intravenously. Heart rate slowed to approximately 90, atrial fibrillation. . Final diagnoses:  None   strongly suspect status epilepticus given a seizure here,  doubt acute stroke I spoke with Dr.Viyouh Plan admit to step down unit Diagnosis #1 status epilepticus #2 atrial fibrillation with rapid ventricular response CRITICAL CARE Performed by: Doug SouJACUBOWITZ,SAM Total critical care time: 45 minute Critical care time was exclusive of separately billable procedures and treating other patients. Critical care was necessary to treat or prevent imminent or life-threatening deterioration. Critical care was time spent personally by me on the following activities: development of treatment plan with patient and/or surrogate  as well as nursing, discussions with consultants, evaluation of patient's response to treatment, examination of patient, obtaining history from patient or surrogate, ordering and performing treatments and interventions, ordering and review of laboratory studies, ordering and review of radiographic studies, pulse oximetry and re-evaluation of patient's condition.    Doug Sou, MD 04/15/14 [phone]

## 2014-04-15 NOTE — ED Notes (Signed)
Pt presents from CT scanner to Trauma C with Stroke Team at bedside. Pt contracted, nonverbal, opening eyes.

## 2014-04-15 NOTE — Consult Note (Addendum)
Reason for Consult: New-onset seizure activity.  HPI:                                                                                                                                          [name] [name] is an 80 y.o. female history of atrial fibrillation on Xarelto, hypertension, previous stroke, dementia and hypothyroidism who was brought to the emergency room with acute change in mental status at the facility where she resides. Patient was noted to stop speaking and slumped to the right side and was nonresponsive to those around her. When EMS arrived they found her staring and inattentive including to visual threat. At one point she stiffened with arms flexed and legs extended. On arrival in the emergency room she was noted to be unresponsive with eyes open staring straight ahead. She had slight blank to visual threat. A CT scan of her head was obtained which showed no acute intracranial abnormality. Following return to the emergency room from CT scan the patient had a witnessed generalized seizure which consisted of eye deviation to the right as well as predominantly right focal motor seizure activity. She was given 1 mg of Ativan. Keppra was ordered IV, 1000 mg. Blood pressure was elevated. She was given 10 mg of labetalol. With good response. She has no previous history of seizure activity. She initially arrived in code stroke status. Code stroke was subsequently canceled.  Past Medical History  Diagnosis Date  . Renal insufficiency   . Hypertension   . A-fib   . Anxiety   . Depression   . Hypothyroidism   . Inguinal hernia     repaired 2011, right  . Osteoarthritis   . Cerebral vascular accident   . DJD (degenerative joint disease)   . Recurrent UTI   . C. difficile colitis     recurrent  . Dementia   . Anemia   . Cataract   . Asteatotic eczema   . Peripheral vascular disease   . Hyponatremia   . Malnutrition   . Sepsis(995.91)   . TIA (transient ischemic attack)   . Urinary  incontinence     Past Surgical History  Procedure Laterality Date  . Inguinal hernia repair    . Bladder surgery      prolapsed bladder  . Cataract extraction      bilateral    Family History  Problem Relation Age of Onset  . Kidney Stones Mother   . Colon cancer Neg Hx     Social History:  reports that she quit smoking about 53 years ago. Her smoking use included Cigarettes. She smoked 0.00 packs per day. She has never used smokeless tobacco. She reports that she does not drink alcohol or use illicit drugs.  Allergies  Allergen Reactions  . Penicillins     "red faced" and on MAR    MEDICATIONS:  I have reviewed the patient's current medications.   ROS:                                                                                                                                       History obtained from patient's son  General ROS: negative for - chills, fatigue, fever, night sweats, weight gain or weight loss Psychological ROS: Slight memory difficulty Ophthalmic ROS: negative for - blurry vision, double vision, eye pain or loss of vision ENT ROS: negative for - epistaxis, nasal discharge, oral lesions, sore throat, tinnitus or vertigo Allergy and Immunology ROS: negative for - hives or itchy/watery eyes Hematological and Lymphatic ROS: negative for - bleeding problems, bruising or swollen lymph nodes Endocrine ROS: negative for - galactorrhea, hair pattern changes, polydipsia/polyuria or temperature intolerance Respiratory ROS: negative for - cough, hemoptysis, shortness of breath or wheezing Cardiovascular ROS: negative for - chest pain, dyspnea on exertion, edema or irregular heartbeat Gastrointestinal ROS: negative for - abdominal pain, diarrhea, hematemesis, nausea/vomiting or stool incontinence Genito-Urinary ROS: negative for - dysuria, hematuria,  incontinence or urinary frequency/urgency Musculoskeletal ROS: Doesn't ambulate and is wheelchair-bound Neurological ROS: as noted in HPI Dermatological ROS: negative for rash and skin lesion changes   Blood pressure 127/77, pulse 25, temperature 97.7 F (36.5 C), temperature source Rectal, resp. rate 23, SpO2 97.00%.   Neurologic Examination:                                                                                                      Mental Status: Eyes were open but patient did not respond except minimally to visual threat. She had no speech output.   Cranial Nerves: II-responded to visual threat bilaterally. III/IV/VI-Pupils were equal and reacted. She was able to move eyes conjugately to the right as well as to the left and response to verbal stimulation, although gaze was not full.    VII-no facial numbness and no facial weakness. X-no speech output Motor: Patient had no spontaneous movements except occasional movement of left upper extremity. Muscle tone was increased throughout, right greater than left. Sensory: Unable to assess Deep Tendon Reflexes: Trace only on the left and 1+ on the right. Plantars: Mute bilaterally Cerebellar: Unable to assess.  Lab Results  Component Value Date/Time   CHOL 155 09/25/2008  8:10 PM    Results for orders placed during the hospital encounter of 04/15/14 (from the past 48 hour(s))  ETHANOL     Status: None   Collection Time  04/15/14  1:44 PM      Result Value Ref Range   Alcohol, Ethyl (B) <11  0 - 11 mg/dL   Comment:            LOWEST DETECTABLE LIMIT FOR     SERUM ALCOHOL IS 11 mg/dL     FOR MEDICAL PURPOSES ONLY  PROTIME-INR     Status: Abnormal   Collection Time    04/15/14  1:44 PM      Result Value Ref Range   Prothrombin Time 16.6 (*) 11.6 - 15.2 seconds   INR 1.34  0.00 - 1.49  APTT     Status: None   Collection Time    04/15/14  1:44 PM      Result Value Ref Range   aPTT 28  24 - 37 seconds  CBC      Status: Abnormal   Collection Time    04/15/14  1:44 PM      Result Value Ref Range   WBC 6.8  4.0 - 10.5 K/uL   RBC 5.14 (*) 3.87 - 5.11 MIL/uL   Hemoglobin 13.6  12.0 - 15.0 g/dL   HCT 43.9  36.0 - 46.0 %   MCV 85.4  78.0 - 100.0 fL   MCH 26.5  26.0 - 34.0 pg   MCHC 31.0  30.0 - 36.0 g/dL   RDW 17.3 (*) 11.5 - 15.5 %   Platelets 190  150 - 400 K/uL  DIFFERENTIAL     Status: None   Collection Time    04/15/14  1:44 PM      Result Value Ref Range   Neutrophils Relative % 60  43 - 77 %   Neutro Abs 4.2  1.7 - 7.7 K/uL   Lymphocytes Relative 26  12 - 46 %   Lymphs Abs 1.8  0.7 - 4.0 K/uL   Monocytes Relative 9  3 - 12 %   Monocytes Absolute 0.6  0.1 - 1.0 K/uL   Eosinophils Relative 4  0 - 5 %   Eosinophils Absolute 0.2  0.0 - 0.7 K/uL   Basophils Relative 1  0 - 1 %   Basophils Absolute 0.1  0.0 - 0.1 K/uL  COMPREHENSIVE METABOLIC PANEL     Status: Abnormal   Collection Time    04/15/14  1:44 PM      Result Value Ref Range   Sodium 141  137 - 147 mEq/L   Potassium 4.3  3.7 - 5.3 mEq/L   Chloride 98  96 - 112 mEq/L   CO2 25  19 - 32 mEq/L   Glucose, Bld 110 (*) 70 - 99 mg/dL   BUN 27 (*) 6 - 23 mg/dL   Creatinine, Ser 0.91  0.50 - 1.10 mg/dL   Calcium 10.1  8.4 - 10.5 mg/dL   Total Protein 8.0  6.0 - 8.3 g/dL   Albumin 4.1  3.5 - 5.2 g/dL   AST 22  0 - 37 U/L   ALT 16  0 - 35 U/L   Alkaline Phosphatase 91  39 - 117 U/L   Total Bilirubin 0.5  0.3 - 1.2 mg/dL   GFR calc non Af Amer 55 (*) >90 mL/min   GFR calc Af Amer 64 (*) >90 mL/min   Comment: (NOTE)     The eGFR has been calculated using the CKD EPI equation.     This calculation has not been validated in all clinical situations.     eGFR's  persistently <90 mL/min signify possible Chronic Kidney     Disease.  Randolm Idol, ED     Status: None   Collection Time    04/15/14  1:57 PM      Result Value Ref Range   Troponin i, poc 0.01  0.00 - 0.08 ng/mL   Comment 3            Comment: Due to the release  kinetics of cTnI,     a negative result within the first hours     of the onset of symptoms does not rule out     myocardial infarction with certainty.     If myocardial infarction is still suspected,     repeat the test at appropriate intervals.  I-STAT CHEM 8, ED     Status: Abnormal   Collection Time    04/15/14  2:00 PM      Result Value Ref Range   Sodium 140  137 - 147 mEq/L   Potassium 4.2  3.7 - 5.3 mEq/L   Chloride 102  96 - 112 mEq/L   BUN 28 (*) 6 - 23 mg/dL   Creatinine, Ser 1.10  0.50 - 1.10 mg/dL   Glucose, Bld 108 (*) 70 - 99 mg/dL   Calcium, Ion 1.22  1.13 - 1.30 mmol/L   TCO2 25  0 - 100 mmol/L   Hemoglobin 15.6 (*) 12.0 - 15.0 g/dL   HCT 46.0  36.0 - 46.0 %    Ct Head Wo Contrast  04/15/2014   CLINICAL DATA:  Code stroke, posturing, left gaze  EXAM: CT HEAD WITHOUT CONTRAST  TECHNIQUE: Contiguous axial images were obtained from the base of the skull through the vertex without intravenous contrast.  COMPARISON:  02/15/2012  FINDINGS: Severe diffuse cortical atrophy and severe low-attenuation diffusely in the deep white matter. Sub cm lacunar infarct left thalamus. Sub cm lacunar infarcts adjacent to 1 another in the inferior left cerebellar hemisphere, stable. Sub cm lacunar infarct deep right periventricular white matter, stable. No evidence of vascular territory infarct. No hemorrhage or extra axial fluid. No hydrocephalus or evidence of mass. Calvarium is intact.  IMPRESSION: Severe chronic involutional change with no acute findings. Critical Value/emergent results were called by telephone at the time of interpretation on 04/15/2014 at 1:58 PM to Dr. Nicole Kindred , who verbally acknowledged these results.   Electronically Signed   By: Skipper Cliche M.D.   On: 04/15/2014 13:58     Assessment/Plan: 80 year old lady with multiple medical problems, including atrial fibrillation on anticoagulation, hypertension and previous stroke, presenting with new onset focal seizure  activity with secondary generalization. Clinical manifestations of seizure activity indicate seizure focus involving the left cerebral hemisphere primarily. CT scan of her head was unremarkable. Acute stroke, however, cannot be ruled out. No evidence of acute intracranial hemorrhage was seen on CT.  Recommendations: 1. Admission to step down unit for close monitoring and seizure management 2. Continue Keppra at 500 mg every 12 hours IV 3. EEG routine adult study 4. MRI of the brain without contrast to rule out possible acute stroke 5. Blood pressure management per primary admitting team. 6. Continues around to for anticoagulation  We will continue to follow this patient closely with you.  Patient's initial evaluation and management required complex clinical and neurodiagnostic evaluation studies as well as complex decision-making, including acute stroke management screening, management of acute generalized seizure activity, as well as management of hypertensive urgency. Total critical care time was 90 minutes.  Saddie Benders, MD [facility] [phone]  04/15/2014, 2:52 PM

## 2014-04-16 ENCOUNTER — Inpatient Hospital Stay (HOSPITAL_COMMUNITY): Payer: PRIVATE HEALTH INSURANCE

## 2014-04-16 DIAGNOSIS — D649 Anemia, unspecified: Secondary | ICD-10-CM

## 2014-04-16 DIAGNOSIS — R569 Unspecified convulsions: Secondary | ICD-10-CM

## 2014-04-16 DIAGNOSIS — F411 Generalized anxiety disorder: Secondary | ICD-10-CM

## 2014-04-16 DIAGNOSIS — I4891 Unspecified atrial fibrillation: Secondary | ICD-10-CM

## 2014-04-16 DIAGNOSIS — E43 Unspecified severe protein-calorie malnutrition: Secondary | ICD-10-CM | POA: Insufficient documentation

## 2014-04-16 LAB — TROPONIN I

## 2014-04-16 LAB — BASIC METABOLIC PANEL
BUN: 22 mg/dL (ref 6–23)
CHLORIDE: 101 meq/L (ref 96–112)
CO2: 25 meq/L (ref 19–32)
Calcium: 9.4 mg/dL (ref 8.4–10.5)
Creatinine, Ser: 0.89 mg/dL (ref 0.50–1.10)
GFR calc Af Amer: 66 mL/min — ABNORMAL LOW (ref >90)
GFR calc non Af Amer: 57 mL/min — ABNORMAL LOW (ref >90)
Glucose, Bld: 87 mg/dL (ref 70–99)
POTASSIUM: 4.1 meq/L (ref 3.7–5.3)
SODIUM: 141 meq/L (ref 137–147)

## 2014-04-16 LAB — CBC
HCT: 34.4 % — ABNORMAL LOW (ref 36.0–46.0)
Hemoglobin: 10.8 g/dL — ABNORMAL LOW (ref 12.0–15.0)
MCH: 26.5 pg (ref 26.0–34.0)
MCHC: 31.4 g/dL (ref 30.0–36.0)
MCV: 84.5 fL (ref 78.0–100.0)
PLATELETS: 158 10*3/uL (ref 150–400)
RBC: 4.07 MIL/uL (ref 3.87–5.11)
RDW: 17.6 % — AB (ref 11.5–15.5)
WBC: 7.5 10*3/uL (ref 4.0–10.5)

## 2014-04-16 MED ORDER — METOPROLOL TARTRATE 1 MG/ML IV SOLN
5.0000 mg | Freq: Four times a day (QID) | INTRAVENOUS | Status: DC
  Administered 2014-04-16 – 2014-04-17 (×4): 5 mg via INTRAVENOUS
  Filled 2014-04-16 (×6): qty 5

## 2014-04-16 MED ORDER — METOPROLOL TARTRATE 1 MG/ML IV SOLN
2.5000 mg | Freq: Four times a day (QID) | INTRAVENOUS | Status: DC | PRN
  Administered 2014-04-16 – 2014-04-17 (×2): 2.5 mg via INTRAVENOUS
  Filled 2014-04-16 (×2): qty 5

## 2014-04-16 MED ORDER — ASPIRIN 300 MG RE SUPP
300.0000 mg | Freq: Every day | RECTAL | Status: DC
  Administered 2014-04-16 – 2014-04-19 (×4): 300 mg via RECTAL
  Filled 2014-04-16 (×4): qty 1

## 2014-04-16 MED ORDER — DEXTROSE 5 % IV SOLN
1.0000 g | INTRAVENOUS | Status: DC
  Administered 2014-04-16 – 2014-04-20 (×5): 1 g via INTRAVENOUS
  Filled 2014-04-16 (×5): qty 10

## 2014-04-16 NOTE — Care Management Note (Addendum)
    Page 1 of 1   04/22/2014     3:48:47 PM CARE MANAGEMENT NOTE 04/22/2014  Patient:  [name] BeringSTANLEY,[name]   Account Number:  [id number]  Date Initiated:  04/16/2014  Documentation initiated by:  GRAVES-BIGELOW,BRENDA  Subjective/Objective Assessment:   Pt admitted from SNF for Seizures, New onset. Iv keppra load.     Action/Plan:   CSW to assist with disposition needs.   Anticipated DC Date:  04/23/2014   Anticipated DC Plan:  SKILLED NURSING FACILITY  In-house referral  Clinical Social Worker      DC Planning Services  CM consult      Choice offered to / List presented to:             Status of service:  In process, will continue to follow Medicare Important Message given?  YES (If response is "NO", the following Medicare IM given date fields will be blank) Date Medicare IM given:  04/18/2014 Medicare IM given by:  Junius CreamerWELL,DEBBIE Date Additional Medicare IM given:   Additional Medicare IM given by:    Discharge Disposition:  SKILLED NURSING FACILITY  Per UR Regulation:  Reviewed for med. necessity/level of care/duration of stay  If discussed at Long Length of Stay Meetings, dates discussed:   04/22/2014    Comments:  04/22/14 [name] AceJulie Jayana Kotula, RN, BSN [phone] [facility], and plan is to return there, likely 04/23/14.

## 2014-04-16 NOTE — Progress Notes (Addendum)
INITIAL NUTRITION ASSESSMENT  DOCUMENTATION CODES Per approved criteria  -Severe malnutrition in the context of chronic illness -Underweight   Pt meets criteria for severe MALNUTRITION in the context of chronic illness as evidenced by severe depletion of muscle and subcutaneous fat mass.  INTERVENTION:  Diet advancement per SLP after swallow evaluation, recommend no dietary restrictions.  Once diet advanced, recommend Boost Plus TID between meals to maximize oral intake.  NUTRITION DIAGNOSIS: Malnutrition related to inadequate oral intake as evidenced by severe depletion of muscle and subcutaneous fat mass.   Goal: Intake to meet >90% of estimated nutrition needs.  Monitor:  Diet advancement, PO intake, labs, weight trend.  Reason for Assessment: MD Consult  80 y.o. female  Admitting Dx: jerking per family, Unresponsiveness  ASSESSMENT: 80 y.o. female SNF resident with multiple medical problems including CVA/TIA, Afib on xarelto, HTN, Dementia, Hypothyroidism who presents with unresponsiveness. Per family pt was found slumped to right in her wheelchair at SNF, and son relates that she had jerking activity. Code stroke was called prior to arrival. While in ED she had a grandmal seizure>> she was given IV ativan and then loaded with IV keppra per Neuro, and pt has not had further seizures. Head CT neg for acute findings, severe chronic involutional change noted.  Patient's family reports that patient was changed to a pureed diet 1-2 months ago because the SNF lost her dentures and she could not eat the regular food. Since diet changed to pureed foods, she has been eating very well and has gained 2 lbs. She likes chocolate Boost supplements, drinks at least one per day.  Awaiting swallow evaluation for diet advancement; per RN, SLP to evaluate patient this afternoon.  Nutrition Focused Physical Exam:  Subcutaneous Fat:  Orbital Region: mild-moderate depletion Upper Arm Region:  severe depletion Thoracic and Lumbar Region: NA  Muscle:  Temple Region: mild-moderate depletion Clavicle Bone Region: severe depletion Clavicle and Acromion Bone Region: severe depletion Scapular Bone Region: NA Dorsal Hand: severe depletion Patellar Region: severe depletion Anterior Thigh Region: severe depletion Posterior Calf Region: severe depletion  Edema: none   Height: Ht Readings from Last 1 Encounters:  04/16/14 5\' 4"  (1.626 m)    Weight: Wt Readings from Last 1 Encounters:  04/16/14 100 lb 1.4 oz (45.4 kg)    Ideal Body Weight: 54.5 kg  % Ideal Body Weight: 83%  Wt Readings from Last 10 Encounters:  04/16/14 100 lb 1.4 oz (45.4 kg)  06/11/13 101 lb 12.8 oz (46.176 kg)  05/20/13 103 lb 9.6 oz (46.993 kg)  08/14/12 115 lb (52.164 kg)  02/17/12 120 lb 9.6 oz (54.704 kg)  10/24/11 119 lb 7.8 oz (54.2 kg)  09/20/11 111 lb 9.6 oz (50.621 kg)  12/02/10 107 lb 3.2 oz (48.626 kg)  07/15/10 112 lb 0.8 oz (50.826 kg)  05/20/10 107 lb 0.6 oz (48.552 kg)    Usual Body Weight: 103 lb (1 year ago)  % Usual Body Weight: 97%  BMI:  Body mass index is 17.17 kg/(m^2). Underweight  Estimated Nutritional Needs: Kcal: 1300-1500 Protein: 65-80 gm Fluid: 1.5 L  Skin: WDL  Diet Order: NPO  EDUCATION NEEDS: -No education needs identified at this time   Intake/Output Summary (Last 24 hours) at 04/16/14 1453 Last data filed at 04/16/14 0907  Gross per 24 hour  Intake 1323.75 ml  Output      0 ml  Net 1323.75 ml    Last BM: PTA   Labs:   Recent Labs Lab  04/15/14 1344 04/15/14 1400 04/16/14 0139  NA 141 140 141  K 4.3 4.2 4.1  CL 98 102 101  CO2 25  --  25  BUN 27* 28* 22  CREATININE 0.91 1.10 0.89  CALCIUM 10.1  --  9.4  GLUCOSE 110* 108* 87    CBG (last 3)  No results found for this basename: GLUCAP,  in the last 72 hours  Scheduled Meds: . aspirin  300 mg Rectal Daily  . cefTRIAXone (ROCEPHIN)  IV  1 g Intravenous Q24H  . citalopram  20  mg Oral Daily  . fluticasone  1 spray Each Nare QHS  . levETIRAcetam  500 mg Intravenous Q12H  . levothyroxine  100 mcg Intravenous Daily  . metoprolol  5 mg Intravenous 4 times per day  . pantoprazole (PROTONIX) IV  40 mg Intravenous Daily  . Rivaroxaban  15 mg Oral Daily    Continuous Infusions: . sodium chloride 50 mL/hr at 04/16/14 1214    Past Medical History  Diagnosis Date  . Renal insufficiency   . Hypertension   . A-fib   . Anxiety   . Depression   . Hypothyroidism   . Inguinal hernia     repaired 2011, right  . Osteoarthritis   . Cerebral vascular accident   . DJD (degenerative joint disease)   . Recurrent UTI   . C. difficile colitis     recurrent  . Dementia   . Anemia   . Cataract   . Asteatotic eczema   . Peripheral vascular disease   . Hyponatremia   . Malnutrition   . Sepsis(995.91)   . TIA (transient ischemic attack)   . Urinary incontinence     Past Surgical History  Procedure Laterality Date  . Inguinal hernia repair    . Bladder surgery      prolapsed bladder  . Cataract extraction      bilateral    Joaquin CourtsKimberly Moesha Sarchet, RD, LDN, CNSC Pager [phone] After Hours Pager [phone]

## 2014-04-16 NOTE — Evaluation (Signed)
Clinical/Bedside Swallow Evaluation Patient Details  Name: [name] [name] MRN: [id number] Date of Birth: [birth year]  Today's Date: 04/16/2014 Time: [id number] SLP Time Calculation (min): 25 min  Past Medical History:  Past Medical History  Diagnosis Date  . Renal insufficiency   . Hypertension   . A-fib   . Anxiety   . Depression   . Hypothyroidism   . Inguinal hernia     repaired 2011, right  . Osteoarthritis   . Cerebral vascular accident   . DJD (degenerative joint disease)   . Recurrent UTI   . C. difficile colitis     recurrent  . Dementia   . Anemia   . Cataract   . Asteatotic eczema   . Peripheral vascular disease   . Hyponatremia   . Malnutrition   . Sepsis(995.91)   . TIA (transient ischemic attack)   . Urinary incontinence    Past Surgical History:  Past Surgical History  Procedure Laterality Date  . Inguinal hernia repair    . Bladder surgery      prolapsed bladder  . Cataract extraction      bilateral   HPI:  80 y.o. female with history of atrial fibrillation on Xarelto, hypertension, previous stroke, dementia and hypothyroidism was brought to the emergency room with acute change in mental status at the facility where she resides.  Dx of new onset focal seizure activity with secondary generalization.  CT scan of her head was unremarkable.  Per RD note, pt's diet at SNF changed recently to pureed; since that time she has been eating very well and has gained 2 lbs.   Daughter reports pt can engage in conversation at baseline; feeds herself.    Assessment / Plan / Recommendation Clinical Impression  Pt admitted after seizure activity; per daughter, with improved periods of alertness this afternoon, however pt remains generally lethargic and is not sufficiently attentive to begin POs.  Demonstrates poor oral recognition and initiation to manipulate POs; swallow response is present, but non-protective with immediate cough elicited after consumption of ice  chips/tspn water.  Daughter present and eager for her mother to begin eating.  We discussed that as pt's mental status improves, concomitant improvement in swallow should be expected as well.     Aspiration Risk  Severe    Diet Recommendation NPO        Other  Recommendations Oral Care Recommendations:  (QID)   Follow Up Recommendations   (tba)    Frequency and Duration min 2x/week  1 week       SLP Swallow Goals     Swallow Study Prior Functional Status       General Date of Onset: 04/15/14 HPI: 80 y.o. female with history of atrial fibrillation on Xarelto, hypertension, previous stroke, dementia and hypothyroidism was brought to the emergency room with acute change in mental status at the facility where she resides.  Dx of new onset focal seizure activity with secondary generalization.  CT scan of her head was unremarkable.  Per RD note, pt's diet at SNF changed recently to pureed; since that time she has been eating very well and has gained 2 lbs.   Type of Study: Bedside swallow evaluation Previous Swallow Assessment: none per records Diet Prior to this Study: NPO Temperature Spikes Noted: No Respiratory Status: Nasal cannula History of Recent Intubation: No Behavior/Cognition: Lethargic Oral Cavity - Dentition: Missing dentition Self-Feeding Abilities: Total assist Patient Positioning: Upright in bed Baseline Vocal Quality: Low vocal intensity Volitional Cough: Cognitively  unable to elicit Volitional Swallow: Unable to elicit    Oral/Motor/Sensory Function Overall Oral Motor/Sensory Function:  (symmetrical; pt maintained open-mouth posture most of sessio)   Ice Chips Ice chips: Impaired Presentation: Spoon Oral Phase Impairments: Reduced labial seal;Reduced lingual movement/coordination;Poor awareness of bolus Oral Phase Functional Implications: Prolonged oral transit;Oral residue Pharyngeal Phase Impairments: Suspected delayed Swallow;Cough - Immediate;Throat  Clearing - Immediate   Thin Liquid Thin Liquid: Impaired Presentation: Spoon Oral Phase Impairments: Reduced labial seal;Reduced lingual movement/coordination;Poor awareness of bolus Oral Phase Functional Implications: Prolonged oral transit;Oral holding Pharyngeal  Phase Impairments: Suspected delayed Swallow;Throat Clearing - Immediate;Cough - Immediate    Nectar Thick Nectar Thick Liquid: Not tested   Honey Thick Honey Thick Liquid: Not tested   Puree Puree: Not tested   Solid   [name] [name] L. [place], [name]     Solid: Not tested       [name] [name], [name] [name] 04/16/2014,3:50 PM

## 2014-04-16 NOTE — Progress Notes (Signed)
Subjective: Patient improved today but not at baseline per daughter.  On Keppra and seems to be tolerating well.    Objective: Current vital signs: BP 159/131  Pulse 90  Temp(Src) 97.2 F (36.2 C) (Axillary)  Resp 15  Ht 5\' 4"  (1.626 m)  Wt 45.4 kg (100 lb 1.4 oz)  BMI 17.17 kg/m2  SpO2 100% Vital signs in last 24 hours: Temp:  [97.2 F (36.2 C)-99.2 F (37.3 C)] 97.2 F (36.2 C) (07/01 1500) Pulse Rate:  [77-114] 90 (07/01 1530) Resp:  [13-26] 15 (07/01 1730) BP: (137-185)/(84-131) 159/131 mmHg (07/01 1730) SpO2:  [96 %-100 %] 100 % (07/01 1530) Weight:  [45.4 kg (100 lb 1.4 oz)] 45.4 kg (100 lb 1.4 oz) (07/01 1445)  Intake/Output from previous day: 06/30 0701 - 07/01 0700 In: 1090 [I.V.:675; IV Piggyback:415] Out: -  Intake/Output this shift: Total I/O In: 233.8 [I.V.:233.8] Out: -  Nutritional status: NPO  Neurologic Exam: Mental Status:  Opens eyes when name called and follow some simple commands.  Attempts speech but dysarthric and only able to be understood by daughter.   Cranial Nerves:  II-responded to visual threat bilaterally.  III/IV/VI-Pupils were equal and reacted. She was able to move eyes conjugately to the right as well as to the left and response to verbal stimulation, although gaze was not full.  VII-no facial numbness and no facial weakness.  VII-grossly intact IX/X: intact gag XI: bilateral shoulder shrug GMW:NUUVOZXII:unable to perform Motor: Able to move all extremities, upper extremities greater than lower extremities. Sensory: Responds to noxious stimuli throughout Deep Tendon Reflexes: Trace only on the left and 1+ on the right.  Plantars: Mute bilaterally  Cerebellar: Unable to assess.   Lab Results: Basic Metabolic Panel:  Recent Labs Lab 04/15/14 1344 04/15/14 1400 04/16/14 0139  NA 141 140 141  K 4.3 4.2 4.1  CL 98 102 101  CO2 25  --  25  GLUCOSE 110* 108* 87  BUN 27* 28* 22  CREATININE 0.91 1.10 0.89  CALCIUM 10.1  --  9.4     Liver Function Tests:  Recent Labs Lab 04/15/14 1344  AST 22  ALT 16  ALKPHOS 91  BILITOT 0.5  PROT 8.0  ALBUMIN 4.1   No results found for this basename: LIPASE, AMYLASE,  in the last 168 hours No results found for this basename: AMMONIA,  in the last 168 hours  CBC:  Recent Labs Lab 04/15/14 1344 04/15/14 1400 04/16/14 0139  WBC 6.8  --  7.5  NEUTROABS 4.2  --   --   HGB 13.6 15.6* 10.8*  HCT 43.9 46.0 34.4*  MCV 85.4  --  84.5  PLT 190  --  158    Cardiac Enzymes:  Recent Labs Lab 04/16/14 0139 04/16/14 0520  TROPONINI <0.30 <0.30    Lipid Panel: No results found for this basename: CHOL, TRIG, HDL, CHOLHDL, VLDL, LDLCALC,  in the last 168 hours  CBG: No results found for this basename: GLUCAP,  in the last 168 hours  Microbiology: Results for orders placed during the hospital encounter of 04/15/14  MRSA PCR SCREENING     Status: None   Collection Time    04/15/14  7:14 PM      Result Value Ref Range Status   MRSA by PCR NEGATIVE  NEGATIVE Final   Comment:            The GeneXpert MRSA Assay (FDA     approved for NASAL specimens  only), is one component of a     comprehensive MRSA colonization     surveillance program. It is not     intended to diagnose MRSA     infection nor to guide or     monitor treatment for     MRSA infections.    Coagulation Studies:  Recent Labs  04/15/14 1344  LABPROT 16.6*  INR 1.34    Imaging: Ct Head Wo Contrast  04/15/2014   CLINICAL DATA:  Code stroke, posturing, left gaze  EXAM: CT HEAD WITHOUT CONTRAST  TECHNIQUE: Contiguous axial images were obtained from the base of the skull through the vertex without intravenous contrast.  COMPARISON:  02/15/2012  FINDINGS: Severe diffuse cortical atrophy and severe low-attenuation diffusely in the deep white matter. Sub cm lacunar infarct left thalamus. Sub cm lacunar infarcts adjacent to 1 another in the inferior left cerebellar hemisphere, stable. Sub cm  lacunar infarct deep right periventricular white matter, stable. No evidence of vascular territory infarct. No hemorrhage or extra axial fluid. No hydrocephalus or evidence of mass. Calvarium is intact.  IMPRESSION: Severe chronic involutional change with no acute findings. Critical Value/emergent results were called by telephone at the time of interpretation on 04/15/2014 at 1:58 PM to Dr. Roseanne RenoStewart , who verbally acknowledged these results.   Electronically Signed   By: Esperanza Heiraymond  Rubner M.D.   On: 04/15/2014 13:58   Mr Brain Wo Contrast  04/16/2014   CLINICAL DATA:  Chronic vascular disease and dementia. Mental status changes.  EXAM: MRI HEAD WITHOUT CONTRAST  TECHNIQUE: Multiplanar, multiecho pulse sequences of the brain and surrounding structures were obtained without intravenous contrast.  COMPARISON:  Head CT 04/15/2014.  FINDINGS: Diffusion imaging does not show any acute or subacute infarction. There chronic small-vessel changes affecting the pons. There are old small vessel cerebellar infarctions on the left. There are chronic small-vessel changes of the thalami I with left thalamic old lacunar infarction. There are chronic small-vessel ischemic changes throughout the cerebral hemispheric white matter with hemispheric atrophy. No large vessel territory infarction. No mass lesion, hemorrhage, hydrocephalus or extra-axial collection. No pituitary mass. No inflammatory sinus disease. No skull or skullbase lesion.  IMPRESSION: No acute or reversible finding. Chronic small vessel ischemic changes throughout the brain. Old small vessel infarctions affecting the left cerebellum and the left thalamus.   Electronically Signed   By: Paulina FusiMark  Shogry M.D.   On: 04/16/2014 10:10    Medications:  I have reviewed the patient's current medications. Scheduled: . aspirin  300 mg Rectal Daily  . cefTRIAXone (ROCEPHIN)  IV  1 g Intravenous Q24H  . citalopram  20 mg Oral Daily  . fluticasone  1 spray Each Nare QHS  .  levETIRAcetam  500 mg Intravenous Q12H  . levothyroxine  100 mcg Intravenous Daily  . metoprolol  5 mg Intravenous 4 times per day  . pantoprazole (PROTONIX) IV  40 mg Intravenous Daily  . Rivaroxaban  15 mg Oral Daily    Assessment/Plan: Patient slowly improving.  No further seizure activity noted.  MRI of the brain reviewed and shows no acute changes but does show old left lacunes.  EEG results pending.    Recommendations: 1.  Will follow up results of EEG with further recommendations to be made at that time.      LOS: 1 day   Thana FarrLeslie Daryl Quiros, MD [facility] [phone] 04/16/2014  5:40 PM

## 2014-04-16 NOTE — Progress Notes (Signed)
UR Completed Zaynab Chipman Graves-Bigelow, RN,BSN [phone]  

## 2014-04-16 NOTE — Clinical Social Work Psychosocial (Signed)
Clinical Social Work Department BRIEF PSYCHOSOCIAL ASSESSMENT 04/16/2014  Patient:  [name] [name],[name] [name]     Account Number:  [id number]     Admit date:  04/15/2014  Clinical Social Worker:  Lavell LusterAMPBELL,Jetta Murray BRYANT, LCSWA  Date/Time:  04/16/2014 12:03 PM  Referred by:  Physician  Date Referred:  04/16/2014 Referred for  SNF Placement   Other Referral:   Interview type:  Family Other interview type:   Patient unable to contribute to assessment.    PSYCHOSOCIAL DATA Living Status:  FACILITY Admitted from facility:  [name] Level of care:  Skilled Nursing Facility Primary support name:  Marilu FavreClarence and Gavin PoundDeborah Primary support relationship to patient:  CHILD, ADULT Degree of support available:   SUpport is good.    CURRENT CONCERNS Current Concerns  Post-Acute Placement   Other Concerns:    SOCIAL WORK ASSESSMENT / PLAN CSW spoke with patient's son by phone to complete assessment. Marilu FavreClarence states that the patient is a long term resident of [facility] and has been at the facility for over two and a half years. Marilu FavreClarence states that the family wants the patient to return to the facility at discharge. Marilu FavreClarence and daughter are patient's main supports and the family is happy with the care she [facility].   Assessment/plan status:  Psychosocial Support/Ongoing Assessment of Needs Other assessment/ plan:   Complete Fl2, Fax, PASRR   Information/referral to community resources:   CSW contact information given.    PATIENT'S/FAMILY'S RESPONSE TO PLAN OF CARE: Patient's family plans for patient to [facility] at discharge. CSW will assist when appropriate.       Roddie McBryant Geraldyn Shain MSW, [place]CSWA, [place], [phone]

## 2014-04-16 NOTE — Progress Notes (Signed)
Routine EEG completed. Results pending. 

## 2014-04-16 NOTE — Progress Notes (Signed)
[facility] PROGRESS NOTE  [name] BeringDorothy [name] BJY:[id number] DOB: 1924/03/20 DOA: 04/15/2014 PCP: [name] GroutPANDEY, MAHIMA, MD  Assessment/Plan  Seizures, New onset.  May have been triggered by underlying infection, but no previous seizure history.  Starting to wake up.   -  Appreciate neurology assistance -  head CT neg - continue IV keppra  - EEG and MRI pending - continue step down unit for close monitoring  - empiric abx for probable UTI -  Neurology requesting patient start aspirin.  Given her frailty and falls risk, please consider EITHER xarelto OR aspirin for stroke prevention.    UTI  - f/u urine culture - d/c cipro which can lower sz threshold - start ceftriaxone instead  Atrial fibrillation RVR, HR still elevated - increase metop 5mg  IV q6h -continue xarelto    Malignant HTN, BP still elevated - increase iv metoprolol as above  HYPOTHYROIDISM, TSH likely to be abnl in setting of acute illness  - iv synthroid levothyroxine for now till able to tolerate PO   GERD (gastroesophageal reflux disease), stable, continue PPI  Protein malnutrition -  SLP consult pending  Depression/dementia, resume celexa when able to tolerate   Normocytic anemia, likely partly hemodilutional and in the setting of known iron deficiency and B12 deficiency anemia  -  Monitor for bleeding -  Repeat CBC in AM -  Resume iron and B12 supplements when able  Diet:  NPO pending improvement in mentation Access:  PIV IVF:  yes Proph:  xarelto  Code Status: DNR Family Communication: patient and daughter Disposition Plan: stepdown   Consultants:  Neurology  Procedures:  CT head  EEG  MRI brain  Antibiotics:  cipro 6/30 >> 7/1  Ceftriaxone 7/1 >>   HPI/Subjective:  Patient unable to communicate due to post-ictal state/sedating medications  Objective: Filed Vitals:   04/15/14 1815 04/15/14 2041 04/15/14 2346 04/16/14 0454  BP: 155/106 152/105 150/89 147/84  Pulse: 96 103 114  103  Temp:  98.4 F (36.9 C) 98.4 F (36.9 C) 99.2 F (37.3 C)  TempSrc:  Axillary Axillary Axillary  Resp:  13 20 26   SpO2: 97% 100% 99% 99%    Intake/Output Summary (Last 24 hours) at 04/16/14 [id number] Last data filed at 04/16/14 0600  Gross per 24 hour  Intake   1090 ml  Output      0 ml  Net   1090 ml   There were no vitals filed for this visit.  Exam:   General:  Cachectic CF, No acute distress  HEENT:  NCAT, MMM  Cardiovascular:  IRRR, nl S1, S2 no mrg, 2+ pulses, warm extremities  Respiratory:  rhonchous bilateral BS with some rales anterior right chest, no wheezes, no increased WOB  Abdomen:   NABS, soft, NT/ND  MSK:   Normal tone and bulk, no LEE  Neuro:  Diffusely weak, but able to spontaneously move all extremities.  Data Reviewed: Basic Metabolic Panel:  Recent Labs Lab 04/15/14 1344 04/15/14 1400 04/16/14 0139  NA 141 140 141  K 4.3 4.2 4.1  CL 98 102 101  CO2 25  --  25  GLUCOSE 110* 108* 87  BUN 27* 28* 22  CREATININE 0.91 1.10 0.89  CALCIUM 10.1  --  9.4   Liver Function Tests:  Recent Labs Lab 04/15/14 1344  AST 22  ALT 16  ALKPHOS 91  BILITOT 0.5  PROT 8.0  ALBUMIN 4.1   No results found for this basename: LIPASE, AMYLASE,  in the last 168 hours  No results found for this basename: AMMONIA,  in the last 168 hours CBC:  Recent Labs Lab 04/15/14 1344 04/15/14 1400 04/16/14 0139  WBC 6.8  --  7.5  NEUTROABS 4.2  --   --   HGB 13.6 15.6* 10.8*  HCT 43.9 46.0 34.4*  MCV 85.4  --  84.5  PLT 190  --  158   Cardiac Enzymes:  Recent Labs Lab 04/16/14 0139 04/16/14 0520  TROPONINI <0.30 <0.30   BNP (last 3 results) No results found for this basename: PROBNP,  in the last 8760 hours CBG: No results found for this basename: GLUCAP,  in the last 168 hours  Recent Results (from the past 240 hour(s))  MRSA PCR SCREENING     Status: None   Collection Time    04/15/14  7:14 PM      Result Value Ref Range Status   MRSA by  PCR NEGATIVE  NEGATIVE Final   Comment:            The GeneXpert MRSA Assay (FDA     approved for NASAL specimens     only), is one component of a     comprehensive MRSA colonization     surveillance program. It is not     intended to diagnose MRSA     infection nor to guide or     monitor treatment for     MRSA infections.     Studies: Ct Head Wo Contrast  04/15/2014   CLINICAL DATA:  Code stroke, posturing, left gaze  EXAM: CT HEAD WITHOUT CONTRAST  TECHNIQUE: Contiguous axial images were obtained from the base of the skull through the vertex without intravenous contrast.  COMPARISON:  02/15/2012  FINDINGS: Severe diffuse cortical atrophy and severe low-attenuation diffusely in the deep white matter. Sub cm lacunar infarct left thalamus. Sub cm lacunar infarcts adjacent to 1 another in the inferior left cerebellar hemisphere, stable. Sub cm lacunar infarct deep right periventricular white matter, stable. No evidence of vascular territory infarct. No hemorrhage or extra axial fluid. No hydrocephalus or evidence of mass. Calvarium is intact.  IMPRESSION: Severe chronic involutional change with no acute findings. Critical Value/emergent results were called by telephone at the time of interpretation on 04/15/2014 at 1:58 PM to Dr. Roseanne [name] , who verbally acknowledged these results.   Electronically Signed   By: [name] [name] M.D.   On: 04/15/2014 13:58    Scheduled Meds: . ciprofloxacin  200 mg Intravenous Q12H  . citalopram  20 mg Oral Daily  . fluticasone  1 spray Each Nare QHS  . levETIRAcetam  500 mg Intravenous Q12H  . levothyroxine  100 mcg Intravenous Daily  . metoprolol  5 mg Intravenous 4 times per day  . pantoprazole (PROTONIX) IV  40 mg Intravenous Daily  . Rivaroxaban  15 mg Oral Daily   Continuous Infusions: . sodium chloride 75 mL/hr at 04/15/14 2008    Active Problems:   HYPOTHYROIDISM   Atrial fibrillation   Protein malnutrition   GERD (gastroesophageal reflux  disease)   Seizures   Hypertensive urgency   Dementia    Time spent: 30 min    [name] [name], [name] [name]  [facility] Pager [phone]. If 7PM-7AM, please contact night-coverage at [email or web address], password [email or web address] 04/16/2014, 8:22 AM  LOS: 1 day

## 2014-04-17 DIAGNOSIS — F0390 Unspecified dementia without behavioral disturbance: Secondary | ICD-10-CM

## 2014-04-17 DIAGNOSIS — N390 Urinary tract infection, site not specified: Secondary | ICD-10-CM

## 2014-04-17 DIAGNOSIS — R627 Adult failure to thrive: Secondary | ICD-10-CM

## 2014-04-17 DIAGNOSIS — E43 Unspecified severe protein-calorie malnutrition: Secondary | ICD-10-CM

## 2014-04-17 LAB — CBC
HCT: 37.3 % (ref 36.0–46.0)
Hemoglobin: 11.4 g/dL — ABNORMAL LOW (ref 12.0–15.0)
MCH: 26.1 pg (ref 26.0–34.0)
MCHC: 30.6 g/dL (ref 30.0–36.0)
MCV: 85.4 fL (ref 78.0–100.0)
PLATELETS: 156 10*3/uL (ref 150–400)
RBC: 4.37 MIL/uL (ref 3.87–5.11)
RDW: 17.5 % — ABNORMAL HIGH (ref 11.5–15.5)
WBC: 6.5 10*3/uL (ref 4.0–10.5)

## 2014-04-17 LAB — BASIC METABOLIC PANEL
ANION GAP: 12 (ref 5–15)
BUN: 23 mg/dL (ref 6–23)
CO2: 25 mEq/L (ref 19–32)
Calcium: 9.7 mg/dL (ref 8.4–10.5)
Chloride: 107 mEq/L (ref 96–112)
Creatinine, Ser: 0.99 mg/dL (ref 0.50–1.10)
GFR calc non Af Amer: 50 mL/min — ABNORMAL LOW (ref >90)
GFR, EST AFRICAN AMERICAN: 58 mL/min — AB (ref >90)
Glucose, Bld: 64 mg/dL — ABNORMAL LOW (ref 70–99)
POTASSIUM: 3.9 meq/L (ref 3.7–5.3)
Sodium: 144 mEq/L (ref 137–147)

## 2014-04-17 MED ORDER — HYDRALAZINE HCL 20 MG/ML IJ SOLN
5.0000 mg | Freq: Once | INTRAMUSCULAR | Status: AC
  Administered 2014-04-17: 5 mg via INTRAVENOUS
  Filled 2014-04-17: qty 0.25

## 2014-04-17 MED ORDER — DEXTROSE 50 % IV SOLN: 50.0000 mL | Freq: Once | INTRAVENOUS | Status: AC | PRN

## 2014-04-17 MED ORDER — LACOSAMIDE 50 MG PO TABS
50.0000 mg | ORAL_TABLET | Freq: Two times a day (BID) | ORAL | Status: DC
  Filled 2014-04-17: qty 1

## 2014-04-17 MED ORDER — HYDRALAZINE HCL 20 MG/ML IJ SOLN
10.0000 mg | INTRAMUSCULAR | Status: DC | PRN
  Administered 2014-04-18: 10 mg via INTRAVENOUS
  Filled 2014-04-17 (×2): qty 1

## 2014-04-17 MED ORDER — METOPROLOL TARTRATE 1 MG/ML IV SOLN
10.0000 mg | Freq: Four times a day (QID) | INTRAVENOUS | Status: DC
  Administered 2014-04-17 – 2014-04-19 (×8): 10 mg via INTRAVENOUS
  Filled 2014-04-17 (×12): qty 10

## 2014-04-17 MED ORDER — SODIUM CHLORIDE 0.9 % IV SOLN
200.0000 mg | Freq: Once | INTRAVENOUS | Status: AC
  Administered 2014-04-17: 200 mg via INTRAVENOUS
  Filled 2014-04-17 (×2): qty 20

## 2014-04-17 MED ORDER — KCL IN DEXTROSE-NACL 20-5-0.45 MEQ/L-%-% IV SOLN
INTRAVENOUS | Status: DC
  Administered 2014-04-17 – 2014-04-19 (×3): via INTRAVENOUS
  Filled 2014-04-17 (×4): qty 1000

## 2014-04-17 MED ORDER — ENOXAPARIN SODIUM 30 MG/0.3ML ~~LOC~~ SOLN
30.0000 mg | SUBCUTANEOUS | Status: DC
  Filled 2014-04-17 (×2): qty 0.3

## 2014-04-17 MED ORDER — DEXTROSE 50 % IV SOLN: 25.0000 mL | Freq: Once | INTRAVENOUS | Status: AC | PRN

## 2014-04-17 NOTE — Progress Notes (Signed)
Subjective: Patient with some minimal improvement in mental status but still not at baseline.  On Keppra and no further seizure activity noted.    Objective: Current vital signs: BP 153/97  Pulse 90  Temp(Src) 97.6 F (36.4 C) (Axillary)  Resp 18  Ht 5\' 4"  (1.626 m)  Wt 45.4 kg (100 lb 1.4 oz)  BMI 17.17 kg/m2  SpO2 99% Vital signs in last 24 hours: Temp:  [96.8 F (36 C)-98.9 F (37.2 C)] 97.6 F (36.4 C) (07/02 1449) Pulse Rate:  [50-106] 90 (07/02 1449) Resp:  [18-24] 18 (07/02 1449) BP: (140-179)/(90-112) 153/97 mmHg (07/02 1449) SpO2:  [95 %-100 %] 99 % (07/02 1449)  Intake/Output from previous day: 07/01 0701 - 07/02 0700 In: 783.8 [I.V.:783.8] Out: -  Intake/Output this shift:   Nutritional status: NPO  Neurologic Exam: Mental Status:  Opens eyes when name called and follows some simple commands. Tracks me around the room.  Remains dysarthric but better able to be understood today.    Cranial Nerves:  II-responded to visual threat bilaterally.  III/IV/VI-Pupils were equal and reacted. She was able to move eyes conjugately to the right as well as to the left and response to verbal stimulation, although gaze was not full.  VII-no facial numbness and no facial weakness.  VII-grossly intact  IX/X: intact gag  XI: bilateral shoulder shrug  ZOX:[id number]:unable to perform  Motor: Able to move all extremities, upper extremities greater than lower extremities.  Sensory: Responds to noxious stimuli throughout  Deep Tendon Reflexes: Trace only on the left and 1+ on the right.  Plantars: Mute bilaterally  Cerebellar: Unable to assess.   Lab Results: Basic Metabolic Panel:  Recent Labs Lab 04/15/14 1344 04/15/14 1400 04/16/14 0139 04/17/14 0240  NA 141 140 141 144  K 4.3 4.2 4.1 3.9  CL 98 102 101 107  CO2 25  --  25 25  GLUCOSE 110* 108* 87 64*  BUN 27* 28* 22 23  CREATININE 0.91 1.10 0.89 0.99  CALCIUM 10.1  --  9.4 9.7    Liver Function Tests:  Recent  Labs Lab 04/15/14 1344  AST 22  ALT 16  ALKPHOS 91  BILITOT 0.5  PROT 8.0  ALBUMIN 4.1   No results found for this basename: LIPASE, AMYLASE,  in the last 168 hours No results found for this basename: AMMONIA,  in the last 168 hours  CBC:  Recent Labs Lab 04/15/14 1344 04/15/14 1400 04/16/14 0139 04/17/14 0240  WBC 6.8  --  7.5 6.5  NEUTROABS 4.2  --   --   --   HGB 13.6 15.6* 10.8* 11.4*  HCT 43.9 46.0 34.4* 37.3  MCV 85.4  --  84.5 85.4  PLT 190  --  158 156    Cardiac Enzymes:  Recent Labs Lab 04/16/14 0139 04/16/14 0520  TROPONINI <0.30 <0.30    Lipid Panel: No results found for this basename: CHOL, TRIG, HDL, CHOLHDL, VLDL, LDLCALC,  in the last 168 hours  CBG: No results found for this basename: GLUCAP,  in the last 168 hours  Microbiology: Results for orders placed during the hospital encounter of 04/15/14  MRSA PCR SCREENING     Status: None   Collection Time    04/15/14  7:14 PM      Result Value Ref Range Status   MRSA by PCR NEGATIVE  NEGATIVE Final   Comment:            The GeneXpert MRSA Assay (FDA  approved for NASAL specimens     only), is one component of a     comprehensive MRSA colonization     surveillance program. It is not     intended to diagnose MRSA     infection nor to guide or     monitor treatment for     MRSA infections.    Coagulation Studies:  Recent Labs  04/15/14 1344  LABPROT 16.6*  INR 1.34    Imaging: Mr Brain Wo Contrast  04/16/2014   CLINICAL DATA:  Chronic vascular disease and dementia. Mental status changes.  EXAM: MRI HEAD WITHOUT CONTRAST  TECHNIQUE: Multiplanar, multiecho pulse sequences of the brain and surrounding structures were obtained without intravenous contrast.  COMPARISON:  Head CT 04/15/2014.  FINDINGS: Diffusion imaging does not show any acute or subacute infarction. There chronic small-vessel changes affecting the pons. There are old small vessel cerebellar infarctions on the left. There  are chronic small-vessel changes of the thalami I with left thalamic old lacunar infarction. There are chronic small-vessel ischemic changes throughout the cerebral hemispheric white matter with hemispheric atrophy. No large vessel territory infarction. No mass lesion, hemorrhage, hydrocephalus or extra-axial collection. No pituitary mass. No inflammatory sinus disease. No skull or skullbase lesion.  IMPRESSION: No acute or reversible finding. Chronic small vessel ischemic changes throughout the brain. Old small vessel infarctions affecting the left cerebellum and the left thalamus.   Electronically Signed   By: Paulina FusiMark  Shogry M.D.   On: 04/16/2014 10:10    Medications:  I have reviewed the patient's current medications. Scheduled: . aspirin  300 mg Rectal Daily  . cefTRIAXone (ROCEPHIN)  IV  1 g Intravenous Q24H  . citalopram  20 mg Oral Daily  . fluticasone  1 spray Each Nare QHS  . lacosamide  50 mg Oral BID  . levothyroxine  100 mcg Intravenous Daily  . metoprolol  10 mg Intravenous 4 times per day  . pantoprazole (PROTONIX) IV  40 mg Intravenous Daily    Assessment/Plan: Patient slightly improved.  No further seizure activity noted.  Concerned that there may be some side effects to the Keppra.  EEG shows slowing and triphasic waves.    Recommendations: 1.  D/C keppra 2.  Load Vimpat at 200mg  IV now with a maintenance of 50mg  BID 3.  Will continue to follow with you.  Maintain seizure precautions.     LOS: 2 days   Thana FarrLeslie Kaari Zeigler, MD [facility] [phone] 04/17/2014  5:30 PM

## 2014-04-17 NOTE — Progress Notes (Signed)
Speech Language Pathology Treatment: Dysphagia  Patient Details Name: [name] [name] MRN: [id number] DOB: 1924-06-20 Today's Date: 04/17/2014 Time: [id number] SLP Time Calculation (min): 14 min  Assessment / Plan / Recommendation Clinical Impression  Pt awake, but keeps eyes closed, sometimes verbalizes appropriately to basic Y/N questions. WIth max tactile cueing pt responds to teaspoons of applesauce with slow lingual pumping and delayed swallow with no evidence of aspiration. SLP provided max cueing for teaspoons of ice and sips/straw sips of water with limited awareness from pt and immediate signs of aspiration. Pt may have necessary medications crushed in puree, but otherwise should remain NPO until awareness further improves. Discussed with dtr, MD, and RN who all agree. Educated dtr on findings and oral care precautions. SLP will f/u in am tomorrow for further trials. Please call dtr with results of treatment.    HPI HPI: 80 y.o. female with history of atrial fibrillation on Xarelto, hypertension, previous stroke, dementia and hypothyroidism was brought to the emergency room with acute change in mental status at the facility where she resides.  Dx of new onset focal seizure activity with secondary generalization.  CT scan of her head was unremarkable.  Per RD note, pt's diet at SNF changed recently to pureed; since that time she has been eating very well and has gained 2 lbs.     Pertinent Vitals NA  SLP Plan  Continue with current plan of care    Recommendations Diet recommendations: NPO (except meds crushed in puree) Medication Administration: Crushed with puree              Oral Care Recommendations: Oral care BID Follow up Recommendations: Skilled Nursing facility;24 hour supervision/assistance Plan: Continue with current plan of care    GO    [facility] HospitalBonnie Yeshaya Vath, MA CCC-SLP [phone]  Claudine MoutonDeBlois, Natanya Holecek Caroline 04/17/2014, 8:44 AM

## 2014-04-17 NOTE — Procedures (Signed)
ELECTROENCEPHALOGRAM REPORT   Patient: [name] BeringDorothy [name]       Room #: 1O103S13 EEG No. ID: [id number] Age: 80 y.o.        Sex: female Referring Physician: Short Report Date:  04/17/2014        Interpreting Physician: Thana FarrEYNOLDS,Dagmar Adcox D  History: [name] [name] is an 80 y.o. female presenting with new onset seizures  Medications:  Scheduled: . aspirin  300 mg Rectal Daily  . cefTRIAXone (ROCEPHIN)  IV  1 g Intravenous Q24H  . citalopram  20 mg Oral Daily  . enoxaparin (LOVENOX) injection  30 mg Subcutaneous Q24H  . fluticasone  1 spray Each Nare QHS  . levETIRAcetam  500 mg Intravenous Q12H  . levothyroxine  100 mcg Intravenous Daily  . metoprolol  10 mg Intravenous 4 times per day  . pantoprazole (PROTONIX) IV  40 mg Intravenous Daily    Conditions of Recording:  This is a 16 channel EEG carried out with the patient in the lethargic state.  Description:  The background activity is slow and consists of a mixture of low voltage, poorly organized delta and theta activity.  This activity is not continuous and there are also periods of generalized attenuation that occur intermittently throughout the tracing lasting up to 5 seconds.  Although no distinct epileptiform activity is noted there are frequent generalized triphasic waves noted throughout the tracing.    There is no evidence of normal drowse or sleep. Hyperventilation and intermittent photic stimulation were not performed.  The patient did receive tactile stimuli during the tracing but no change in the background activity was noted.    IMPRESSION: This is an abnormal electroencephalogram secondary to general background slowing and frequent triphasic waves.  This finding is most consistent with an encephalopathy of nonspecific etiology.         Thana FarrLeslie Quentina Fronek, MD [facility] [phone] 04/17/2014, 8:25 AM

## 2014-04-17 NOTE — Progress Notes (Signed)
Dear Doctor: [name] [name] This patient has been identified as a candidate for PICC for the following reason (s): IV therapy over 48 hours If you agree, please write an order for the indicated device. For any questions contact the Vascular Access Team at [phone] if no answer, please leave a message.  Thank you for supporting the early vascular access assessment program.

## 2014-04-17 NOTE — Clinical Social Work Note (Signed)
Verbal handoff given to unit CSW. This CSW signing off.  Roddie McBryant Mickle Campton MSW, [id number], [place], [phone]

## 2014-04-17 NOTE — Progress Notes (Signed)
Called to give report to RN on 2W. Left number for RN to call back.

## 2014-04-17 NOTE — Progress Notes (Addendum)
[facility] PROGRESS NOTE  [name] BeringDorothy [name] ZOX:[id number] DOB: 1924/08/16 DOA: 04/15/2014 PCP: Oneal GroutPANDEY, MAHIMA, MD  Assessment/Plan  Seizures, New onset.  May have been triggered by underlying infection, but no previous seizure history.  Starting to wake up.   -  Appreciate neurology assistance -  head CT neg - continue IV keppra  - EEG report pending  -  MRI demonstrates no acute finding. She has chronic small vessel ischemic changes an old small infarctions affecting the left cerebellum and left thalamus -  Neurology requesting patient start aspirin.  Given her frailty and falls risk, please consider EITHER xarelto OR aspirin for stroke prevention.    UTI - urine culture which was ordered on day of admission was not sent to lab. - continue ceftriaxone, day 3 of antibiotics  Atrial fibrillation RVR, HR around 100bpm - increase to metop 10 mg IV q6h - resume xarelto when tolerating PO - continue daily rectal aspirin  Malignant HTN, BP still elevated -  increase iv metoprolol as above -  Consider clonidine patch -  Start prn hydralazine  HYPOTHYROIDISM, TSH likely to be abnl in setting of acute illness  - iv synthroid levothyroxine for now till able to tolerate PO   GERD (gastroesophageal reflux disease), stable, continue PPI  Protein malnutrition -  SLP to reevaluate today -  Will need liberalized diet and supplements  Depression/dementia, resume celexa when able to tolerate   Normocytic anemia, likely partly hemodilutional and in the setting of known iron deficiency and B12 deficiency anemia  -  Monitor for bleeding -  Repeat CBC in AM -  Resume iron and B12 supplements when able  Diet:  NPO pending improvement in mentation. Speech to reevaluate today Access:  PIV IVF:  yes Proph:  SCDs, may be able to restart xarelto today  Code Status: DNR Family Communication: patient and daughter Disposition Plan:  Transfer to  telemetry   Consultants:  Neurology  Procedures:  CT head  EEG  MRI brain  Antibiotics:  cipro 6/30 >> 7/1  Ceftriaxone 7/1 >>   HPI/Subjective:  Awake, alert, incoherent   Objective: Filed Vitals:   04/16/14 1930 04/16/14 2351 04/17/14 0356 04/17/14 0641  BP: 157/90 165/98 178/101 179/112  Pulse: 83 87 50   Temp: 97.4 F (36.3 C) 97.7 F (36.5 C) 96.8 F (36 C)   TempSrc: Axillary Axillary Axillary   Resp: 24 19 20    Height:      Weight:      SpO2: 95% 100% 98%     Intake/Output Summary (Last 24 hours) at 04/17/14 0803 Last data filed at 04/17/14 0600  Gross per 24 hour  Intake 783.75 ml  Output      0 ml  Net 783.75 ml   Filed Weights   04/16/14 1445  Weight: 45.4 kg (100 lb 1.4 oz)    Exam:   General:  Cachectic CF, No acute distress, awake, alert, and speaking jibberish continuously at me and looking at me intently like I should understand  HEENT:  NCAT, MMM  Cardiovascular:  IRRR, nl S1, S2 no mrg, 2+ pulses, warm extremities  Respiratory:  rhonchous bilateral BS with some rales anterior right chest, no wheezes, no increased WOB  Abdomen:   NABS, soft, NT/ND  MSK:   Increased tone and decreased bulk, no LEE, contractures bilateral lower extremities  Neuro:  Diffusely weak, but able to spontaneously move all extremities.    Data Reviewed: Basic Metabolic Panel:  Recent Labs Lab 04/15/14 1344  04/15/14 1400 04/16/14 0139 04/17/14 0240  NA 141 140 141 144  K 4.3 4.2 4.1 3.9  CL 98 102 101 107  CO2 25  --  25 25  GLUCOSE 110* 108* 87 64*  BUN 27* 28* 22 23  CREATININE 0.91 1.10 0.89 0.99  CALCIUM 10.1  --  9.4 9.7   Liver Function Tests:  Recent Labs Lab 04/15/14 1344  AST 22  ALT 16  ALKPHOS 91  BILITOT 0.5  PROT 8.0  ALBUMIN 4.1   No results found for this basename: LIPASE, AMYLASE,  in the last 168 hours No results found for this basename: AMMONIA,  in the last 168 hours CBC:  Recent Labs Lab 04/15/14 1344  04/15/14 1400 04/16/14 0139 04/17/14 0240  WBC 6.8  --  7.5 6.5  NEUTROABS 4.2  --   --   --   HGB 13.6 15.6* 10.8* 11.4*  HCT 43.9 46.0 34.4* 37.3  MCV 85.4  --  84.5 85.4  PLT 190  --  158 156   Cardiac Enzymes:  Recent Labs Lab 04/16/14 0139 04/16/14 0520  TROPONINI <0.30 <0.30   BNP (last 3 results) No results found for this basename: PROBNP,  in the last 8760 hours CBG: No results found for this basename: GLUCAP,  in the last 168 hours  Recent Results (from the past 240 hour(s))  MRSA PCR SCREENING     Status: None   Collection Time    04/15/14  7:14 PM      Result Value Ref Range Status   MRSA by PCR NEGATIVE  NEGATIVE Final   Comment:            The GeneXpert MRSA Assay (FDA     approved for NASAL specimens     only), is one component of a     comprehensive MRSA colonization     surveillance program. It is not     intended to diagnose MRSA     infection nor to guide or     monitor treatment for     MRSA infections.     Studies: Ct Head Wo Contrast  04/15/2014   CLINICAL DATA:  Code stroke, posturing, left gaze  EXAM: CT HEAD WITHOUT CONTRAST  TECHNIQUE: Contiguous axial images were obtained from the base of the skull through the vertex without intravenous contrast.  COMPARISON:  02/15/2012  FINDINGS: Severe diffuse cortical atrophy and severe low-attenuation diffusely in the deep white matter. Sub cm lacunar infarct left thalamus. Sub cm lacunar infarcts adjacent to 1 another in the inferior left cerebellar hemisphere, stable. Sub cm lacunar infarct deep right periventricular white matter, stable. No evidence of vascular territory infarct. No hemorrhage or extra axial fluid. No hydrocephalus or evidence of mass. Calvarium is intact.  IMPRESSION: Severe chronic involutional change with no acute findings. Critical Value/emergent results were called by telephone at the time of interpretation on 04/15/2014 at 1:58 PM to Dr. Roseanne RenoStewart , who verbally acknowledged these  results.   Electronically Signed   By: Esperanza Heiraymond  Rubner M.D.   On: 04/15/2014 13:58   Mr Brain Wo Contrast  04/16/2014   CLINICAL DATA:  Chronic vascular disease and dementia. Mental status changes.  EXAM: MRI HEAD WITHOUT CONTRAST  TECHNIQUE: Multiplanar, multiecho pulse sequences of the brain and surrounding structures were obtained without intravenous contrast.  COMPARISON:  Head CT 04/15/2014.  FINDINGS: Diffusion imaging does not show any acute or subacute infarction. There chronic small-vessel changes affecting the pons. There are old  small vessel cerebellar infarctions on the left. There are chronic small-vessel changes of the thalami I with left thalamic old lacunar infarction. There are chronic small-vessel ischemic changes throughout the cerebral hemispheric white matter with hemispheric atrophy. No large vessel territory infarction. No mass lesion, hemorrhage, hydrocephalus or extra-axial collection. No pituitary mass. No inflammatory sinus disease. No skull or skullbase lesion.  IMPRESSION: No acute or reversible finding. Chronic small vessel ischemic changes throughout the brain. Old small vessel infarctions affecting the left cerebellum and the left thalamus.   Electronically Signed   By: Paulina Fusi M.D.   On: 04/16/2014 10:10    Scheduled Meds: . aspirin  300 mg Rectal Daily  . cefTRIAXone (ROCEPHIN)  IV  1 g Intravenous Q24H  . citalopram  20 mg Oral Daily  . fluticasone  1 spray Each Nare QHS  . levETIRAcetam  500 mg Intravenous Q12H  . levothyroxine  100 mcg Intravenous Daily  . metoprolol  5 mg Intravenous 4 times per day  . pantoprazole (PROTONIX) IV  40 mg Intravenous Daily  . Rivaroxaban  15 mg Oral Daily   Continuous Infusions: . dextrose 5 % and 0.45 % NaCl with KCl 20 mEq/L      Active Problems:   HYPOTHYROIDISM   Atrial fibrillation   Protein malnutrition   GERD (gastroesophageal reflux disease)   Seizures   Hypertensive urgency   Dementia   Protein-calorie  malnutrition, severe    Time spent: 30 min    Caige Almeda, [facility]  [facility] Pager [phone]. If 7PM-7AM, please contact night-coverage at [email or web address], password [facility] 04/17/2014, 8:03 AM  LOS: 2 days

## 2014-04-18 DIAGNOSIS — F329 Major depressive disorder, single episode, unspecified: Secondary | ICD-10-CM

## 2014-04-18 DIAGNOSIS — F3289 Other specified depressive episodes: Secondary | ICD-10-CM

## 2014-04-18 LAB — GLUCOSE, CAPILLARY
GLUCOSE-CAPILLARY: 119 mg/dL — AB (ref 70–99)
GLUCOSE-CAPILLARY: 163 mg/dL — AB (ref 70–99)
GLUCOSE-CAPILLARY: 173 mg/dL — AB (ref 70–99)
GLUCOSE-CAPILLARY: 141 mg/dL — AB (ref 70–99)
GLUCOSE-CAPILLARY: 155 mg/dL — AB (ref 70–99)
GLUCOSE-CAPILLARY: 130 mg/dL — AB (ref 70–99)
Glucose-Capillary: 123 mg/dL — ABNORMAL HIGH (ref 70–99)
Glucose-Capillary: 162 mg/dL — ABNORMAL HIGH (ref 70–99)
Glucose-Capillary: 146 mg/dL — ABNORMAL HIGH (ref 70–99)
Glucose-Capillary: 131 mg/dL — ABNORMAL HIGH (ref 70–99)

## 2014-04-18 LAB — CBC
HEMATOCRIT: 36.7 % (ref 36.0–46.0)
Hemoglobin: 11.5 g/dL — ABNORMAL LOW (ref 12.0–15.0)
MCH: 26.3 pg (ref 26.0–34.0)
MCHC: 31.3 g/dL (ref 30.0–36.0)
MCV: 84 fL (ref 78.0–100.0)
Platelets: 165 10*3/uL (ref 150–400)
RBC: 4.37 MIL/uL (ref 3.87–5.11)
RDW: 17.8 % — ABNORMAL HIGH (ref 11.5–15.5)
WBC: 6.9 10*3/uL (ref 4.0–10.5)

## 2014-04-18 LAB — BASIC METABOLIC PANEL
Anion gap: 15 (ref 5–15)
BUN: 23 mg/dL (ref 6–23)
CO2: 24 meq/L (ref 19–32)
Calcium: 10 mg/dL (ref 8.4–10.5)
Chloride: 107 mEq/L (ref 96–112)
Creatinine, Ser: 0.97 mg/dL (ref 0.50–1.10)
GFR calc Af Amer: 59 mL/min — ABNORMAL LOW (ref >90)
GFR, EST NON AFRICAN AMERICAN: 51 mL/min — AB (ref >90)
GLUCOSE: 133 mg/dL — AB (ref 70–99)
POTASSIUM: 3.9 meq/L (ref 3.7–5.3)
Sodium: 146 mEq/L (ref 137–147)

## 2014-04-18 LAB — AMMONIA: Ammonia: 22 umol/L (ref 11–60)

## 2014-04-18 MED ORDER — DILTIAZEM HCL ER COATED BEADS 120 MG PO CP24
120.0000 mg | ORAL_CAPSULE | Freq: Every day | ORAL | Status: DC
  Filled 2014-04-18: qty 1

## 2014-04-18 MED ORDER — CLONIDINE HCL 0.1 MG/24HR TD PTWK
0.1000 mg | MEDICATED_PATCH | TRANSDERMAL | Status: DC
  Administered 2014-04-18: 0.1 mg via TRANSDERMAL
  Filled 2014-04-18 (×3): qty 1

## 2014-04-18 MED ORDER — SODIUM CHLORIDE 0.9 % IV SOLN
50.0000 mg | Freq: Two times a day (BID) | INTRAVENOUS | Status: DC
  Administered 2014-04-18 – 2014-04-19 (×3): 50 mg via INTRAVENOUS
  Filled 2014-04-18 (×6): qty 5

## 2014-04-18 NOTE — Progress Notes (Addendum)
Speech Language Pathology Treatment: Dysphagia  Patient Details Name: Laurena BeringDorothy Hungate MRN: [id number] DOB: 06/11/1924 Today's Date: 04/18/2014 Time: [id number] SLP Time Calculation (min): 19 min  Assessment / Plan / Recommendation Clinical Impression  Treatment included diagnostic assessment of oropharyngeal swallow abilities.  Pt. Alert, very confused, speech unintelligible.  Following oral care, total assist required for ice chip and puree texture unsuccessfully.  Pt. Continued to talk with po's pocketing in anterior sulcus without awareness or attempts to manipulate/transit boluses (no swallow initiated with total multimodal cues).  Oral cavity suctioned removing residue.  As requested, SLP  attempted to reach pt's daughter without success (wrong phone #).   Recommend NPO; if pt. Calm, can attempt pills in meds.  Does she need Palliative care for goals of care or thoughts she may return to baseline?   HPI HPI: 80 y.o. female with history of atrial fibrillation on Xarelto, hypertension, previous stroke, dementia and hypothyroidism was brought to the emergency room with acute change in mental status at the facility where she resides.  Dx of new onset focal seizure activity with secondary generalization.  CT scan of her head was unremarkable.  Per RD note, pt's diet at SNF changed recently to pureed; since that time she has been eating very well and has gained 2 lbs.     Pertinent Vitals WDL  SLP Plan  Continue with current plan of care    Recommendations Diet recommendations: NPO (do  not recommend meds at present)              Oral Care Recommendations: Oral care BID Follow up Recommendations: Skilled Nursing facility;24 hour supervision/assistance Plan: Continue with current plan of care    GO     Royce MacadamiaLisa Willis Amiri Riechers M.Ed [employer]-SLP Pager [phone]  04/18/2014

## 2014-04-18 NOTE — Progress Notes (Signed)
CSW continues to follow patient for [facility] once medically ready.  Maree KrabbeLindsay Klair Leising, MSW, Theresia MajorsLCSWA [phone]

## 2014-04-18 NOTE — Progress Notes (Signed)
Daughter Stanton KidneyDebra, P.O.A. Called and stated, " If her mother fails swallowing test Sat. A.M. She would like T.P.N. To be Started on her mother SAT.Marland Kitchen. Also she does not want palliative care consult at this time.

## 2014-04-18 NOTE — Progress Notes (Signed)
[facility] PROGRESS NOTE  [name] [name] NWG:[id number] DOB: [birth year] DOA: 04/15/2014 PCP: Oneal Grout, MD  Assessment/Plan  Seizures, New onset.  May have been triggered by underlying infection, but no previous seizure history.  Changed from keppra to vimpat yesterday by neurology due to persistent sedation. -  Appreciate neurology assistance -  head CT neg -  Change to IV vimpat because patient was not awake enough to take evening dose last night -  EEG with slowing but without epileptiform activity -  MRI demonstrates no acute finding. She has chronic small vessel ischemic changes an old small infarctions affecting the left cerebellum and left thalamus  Somnolence, persistent inability to articulate or tolerate food/PO -  Low suspicion for hypercapnea -  Check ammonia level -  Continue tx for UTI -  Speech therapy assessing daily for safety with medications  UTI - urine culture which was ordered on day of admission was not sent to lab. - continue ceftriaxone, day 4 of antibiotics  Atrial fibrillation RVR, HR around 100bpm - Continue metop 10 mg IV q6h - resume xarelto when tolerating PO - continue daily rectal aspirin  Malignant HTN, BP still elevated -  continue iv metoprolol as above -  Start clonidine patch -  Continue prn hydralazine  HYPOTHYROIDISM, TSH likely to be abnl in setting of acute illness  - iv synthroid levothyroxine for now till able to tolerate PO   GERD (gastroesophageal reflux disease), stable, continue PPI  Severe protein calorie malnutrition -  SLP to reevaluate daily -  Will need liberalized diet and supplements  Hypoglycemia due to poor oral intake and low reserves -  Continue dextrose fluids until tolerating PO  Depression/dementia, resume celexa when able to tolerate   Normocytic anemia, likely partly hemodilutional and in the setting of known iron deficiency and B12 deficiency anemia,  Hemoglobin stable.   -  Resume iron and  B12 supplements when able  Diet:  NPO  Access:  PIV IVF:  yes Proph:  SCDs  Code Status: DNR Family Communication: patient and daughter Disposition Plan:  Telemetry.  I am hoping that she will become more alert as she clears her UTI and her AEDs are adjusted, however, if she continues to be a high aspiration risk with poor mentation, will discuss palliation with family.    Consultants:  Neurology  Procedures:  CT head  EEG  MRI brain  Antibiotics:  cipro 6/30 >> 7/1  Ceftriaxone 7/1 >>   HPI/Subjective:  Asleep but arouseable, still not able to articulate and not safe for medications  Objective: Filed Vitals:   04/17/14 1449 04/17/14 2156 04/17/14 2337 04/18/14 0420  BP: 153/97 174/106 169/76 167/110  Pulse: 90 95 93 112  Temp: 97.6 F (36.4 C) 98 F (36.7 C) 98 F (36.7 C)   TempSrc: Axillary Axillary Axillary Axillary  Resp: 18 20 21 20   Height:      Weight:      SpO2: 99% 98% 97% 95%   No intake or output data in the 24 hours ending 04/18/14 0736 Filed Weights   04/16/14 1445  Weight: 45.4 kg (100 lb 1.4 oz)    Exam:   General:  Cachectic CF, No acute distress, asleep but arouses to voice, makes eye contact/tracts, and speaks jibberish   HEENT:  NCAT, MMM  Cardiovascular:  IRRR, nl S1, S2 no mrg, 2+ pulses, warm extremities  Respiratory:  rhonchous bilateral BS with some rales anterior right chest, no wheezes, no increased WOB  Abdomen:  NABS, soft, NT/ND  MSK:   Increased tone and decreased bulk, no LEE, contractures bilateral lower extremities  Neuro:  Diffusely weak, but able to spontaneously move all extremities.    Data Reviewed: Basic Metabolic Panel:  Recent Labs Lab 04/15/14 1344 04/15/14 1400 04/16/14 0139 04/17/14 0240 04/18/14 0455  NA 141 140 141 144 146  K 4.3 4.2 4.1 3.9 3.9  CL 98 102 101 107 107  CO2 25  --  25 25 24   GLUCOSE 110* 108* 87 64* 133*  BUN 27* 28* 22 23 23   CREATININE 0.91 1.10 0.89 0.99 0.97   CALCIUM 10.1  --  9.4 9.7 10.0   Liver Function Tests:  Recent Labs Lab 04/15/14 1344  AST 22  ALT 16  ALKPHOS 91  BILITOT 0.5  PROT 8.0  ALBUMIN 4.1   No results found for this basename: LIPASE, AMYLASE,  in the last 168 hours No results found for this basename: AMMONIA,  in the last 168 hours CBC:  Recent Labs Lab 04/15/14 1344 04/15/14 1400 04/16/14 0139 04/17/14 0240 04/18/14 0455  WBC 6.8  --  7.5 6.5 6.9  NEUTROABS 4.2  --   --   --   --   HGB 13.6 15.6* 10.8* 11.4* 11.5*  HCT 43.9 46.0 34.4* 37.3 36.7  MCV 85.4  --  84.5 85.4 84.0  PLT 190  --  158 156 165   Cardiac Enzymes:  Recent Labs Lab 04/16/14 0139 04/16/14 0520  TROPONINI <0.30 <0.30   BNP (last 3 results) No results found for this basename: PROBNP,  in the last 8760 hours CBG:  Recent Labs Lab 04/18/14 0205 04/18/14 0415 04/18/14 0628  GLUCAP 123* 119* 163*    Recent Results (from the past 240 hour(s))  MRSA PCR SCREENING     Status: None   Collection Time    04/15/14  7:14 PM      Result Value Ref Range Status   MRSA by PCR NEGATIVE  NEGATIVE Final   Comment:            The GeneXpert MRSA Assay (FDA     approved for NASAL specimens     only), is one component of a     comprehensive MRSA colonization     surveillance program. It is not     intended to diagnose MRSA     infection nor to guide or     monitor treatment for     MRSA infections.     Studies: Mr Brain Wo Contrast  04/16/2014   CLINICAL DATA:  Chronic vascular disease and dementia. Mental status changes.  EXAM: MRI HEAD WITHOUT CONTRAST  TECHNIQUE: Multiplanar, multiecho pulse sequences of the brain and surrounding structures were obtained without intravenous contrast.  COMPARISON:  Head CT 04/15/2014.  FINDINGS: Diffusion imaging does not show any acute or subacute infarction. There chronic small-vessel changes affecting the pons. There are old small vessel cerebellar infarctions on the left. There are chronic  small-vessel changes of the thalami I with left thalamic old lacunar infarction. There are chronic small-vessel ischemic changes throughout the cerebral hemispheric white matter with hemispheric atrophy. No large vessel territory infarction. No mass lesion, hemorrhage, hydrocephalus or extra-axial collection. No pituitary mass. No inflammatory sinus disease. No skull or skullbase lesion.  IMPRESSION: No acute or reversible finding. Chronic small vessel ischemic changes throughout the brain. Old small vessel infarctions affecting the left cerebellum and the left thalamus.   Electronically Signed   By: [name]  Shogry M.D.   On: 04/16/2014 10:10    Scheduled Meds: . aspirin  300 mg Rectal Daily  . cefTRIAXone (ROCEPHIN)  IV  1 g Intravenous Q24H  . citalopram  20 mg Oral Daily  . cloNIDine  0.1 mg Transdermal Weekly  . diltiazem  120 mg Oral Daily  . fluticasone  1 spray Each Nare QHS  . lacosamide (VIMPAT) IV  50 mg Intravenous Q12H  . levothyroxine  100 mcg Intravenous Daily  . metoprolol  10 mg Intravenous 4 times per day  . pantoprazole (PROTONIX) IV  40 mg Intravenous Daily   Continuous Infusions: . dextrose 5 % and 0.45 % NaCl with KCl 20 mEq/L 50 mL/hr at 04/18/14 0701    Active Problems:   HYPOTHYROIDISM   Atrial fibrillation   Protein malnutrition   GERD (gastroesophageal reflux disease)   Seizures   Hypertensive urgency   Dementia   Protein-calorie malnutrition, severe    Time spent: 30 min    Tymara Saur, [facility]  [facility] Pager [phone]. If 7PM-7AM, please contact night-coverage at [email or web address], password [email or web address] 04/18/2014, 7:36 AM  LOS: 3 days

## 2014-04-18 NOTE — Progress Notes (Addendum)
Pt BP rechecked after scheduled lopressor administered.  BP 170s/110s and HR 110s.  Md notified.  Will report to day shift to follow up.    0709 New orders received.  Will report to day RN.

## 2014-04-18 NOTE — Progress Notes (Signed)
Subjective: Patient not significantly changed.  Now on Vimpat.  Keppra discontinued as of yesterday.   Objective: Current vital signs: BP 165/103  Pulse 92  Temp(Src) 97.9 F (36.6 C) (Axillary)  Resp 18  Ht 5\' 4"  (1.626 m)  Wt 45.4 kg (100 lb 1.4 oz)  BMI 17.17 kg/m2  SpO2 95% Vital signs in last 24 hours: Temp:  [97.9 F (36.6 C)-98 F (36.7 C)] 97.9 F (36.6 C) (07/03 1413) Pulse Rate:  [92-112] 92 (07/03 1413) Resp:  [18-21] 18 (07/03 1413) BP: (149-174)/(76-110) 165/103 mmHg (07/03 1413) SpO2:  [95 %-98 %] 95 % (07/03 1413)  Intake/Output from previous day:   Intake/Output this shift: Total I/O In: 1142.5 [I.V.:1142.5] Out: -  Nutritional status: NPO  Neurologic Exam: Mental Status:  Opens eyes when name called and follows some simple commands. Tracks me around the room. Remains dysarthric Cranial Nerves:  II-responded to visual threat bilaterally.  III/IV/VI-Pupils were equal and reactive. She was able to move eyes conjugately to the right as well as to the left and response to verbal stimulation, although gaze was not full.  VII-no facial numbness and no facial weakness.  VII-grossly intact  IX/X: intact gag  XI: bilateral shoulder shrug  ZOX:[id number]:unable to perform  Motor: Able to move all extremities, upper extremities greater than lower extremities.  Sensory: Responds to noxious stimuli throughout  Deep Tendon Reflexes: Trace only on the left and 1+ on the right.  Plantars: Mute bilaterally   Lab Results: Basic Metabolic Panel:  Recent Labs Lab 04/15/14 1344 04/15/14 1400 04/16/14 0139 04/17/14 0240 04/18/14 0455  NA 141 140 141 144 146  K 4.3 4.2 4.1 3.9 3.9  CL 98 102 101 107 107  CO2 25  --  25 25 24   GLUCOSE 110* 108* 87 64* 133*  BUN 27* 28* 22 23 23   CREATININE 0.91 1.10 0.89 0.99 0.97  CALCIUM 10.1  --  9.4 9.7 10.0    Liver Function Tests:  Recent Labs Lab 04/15/14 1344  AST 22  ALT 16  ALKPHOS 91  BILITOT 0.5  PROT 8.0   ALBUMIN 4.1   No results found for this basename: LIPASE, AMYLASE,  in the last 168 hours  Recent Labs Lab 04/18/14 0935  AMMONIA 22    CBC:  Recent Labs Lab 04/15/14 1344 04/15/14 1400 04/16/14 0139 04/17/14 0240 04/18/14 0455  WBC 6.8  --  7.5 6.5 6.9  NEUTROABS 4.2  --   --   --   --   HGB 13.6 15.6* 10.8* 11.4* 11.5*  HCT 43.9 46.0 34.4* 37.3 36.7  MCV 85.4  --  84.5 85.4 84.0  PLT 190  --  158 156 165    Cardiac Enzymes:  Recent Labs Lab 04/16/14 0139 04/16/14 0520  TROPONINI <0.30 <0.30    Lipid Panel: No results found for this basename: CHOL, TRIG, HDL, CHOLHDL, VLDL, LDLCALC,  in the last 168 hours  CBG:  Recent Labs Lab 04/18/14 0415 04/18/14 0628 04/18/14 0753 04/18/14 1001 04/18/14 1206  GLUCAP 119* 163* 162* 173* 141*    Microbiology: Results for orders placed during the hospital encounter of 04/15/14  MRSA PCR SCREENING     Status: None   Collection Time    04/15/14  7:14 PM      Result Value Ref Range Status   MRSA by PCR NEGATIVE  NEGATIVE Final   Comment:            The GeneXpert MRSA Assay (FDA  approved for NASAL specimens     only), is one component of a     comprehensive MRSA colonization     surveillance program. It is not     intended to diagnose MRSA     infection nor to guide or     monitor treatment for     MRSA infections.    Coagulation Studies: No results found for this basename: LABPROT, INR,  in the last 72 hours  Imaging: No results found.  Medications:  I have reviewed the patient's current medications. Scheduled: . aspirin  300 mg Rectal Daily  . cefTRIAXone (ROCEPHIN)  IV  1 g Intravenous Q24H  . cloNIDine  0.1 mg Transdermal Weekly  . fluticasone  1 spray Each Nare QHS  . lacosamide (VIMPAT) IV  50 mg Intravenous Q12H  . levothyroxine  100 mcg Intravenous Daily  . metoprolol  10 mg Intravenous 4 times per day  . pantoprazole (PROTONIX) IV  40 mg Intravenous Daily     Assessment/Plan: Patient unchanged.  AED's just changed yesterday.    Recommendations: 1.  Would continue Vimpat at current dose 2.  Continue seizure precautions   LOS: 3 days   Thana FarrLeslie Onna Nodal, MD [facility] [phone] 04/18/2014  3:21 PM

## 2014-04-19 ENCOUNTER — Encounter (HOSPITAL_COMMUNITY): Payer: Self-pay | Admitting: Radiology

## 2014-04-19 ENCOUNTER — Inpatient Hospital Stay (HOSPITAL_COMMUNITY): Payer: PRIVATE HEALTH INSURANCE

## 2014-04-19 LAB — URINALYSIS, ROUTINE W REFLEX MICROSCOPIC
Bilirubin Urine: NEGATIVE
GLUCOSE, UA: NEGATIVE mg/dL
Hgb urine dipstick: NEGATIVE
Ketones, ur: NEGATIVE mg/dL
Nitrite: NEGATIVE
Protein, ur: 30 mg/dL — AB
Specific Gravity, Urine: 1.021 (ref 1.005–1.030)
Urobilinogen, UA: 0.2 mg/dL (ref 0.0–1.0)
pH: 5.5 (ref 5.0–8.0)

## 2014-04-19 LAB — LIPASE, BLOOD: LIPASE: 68 U/L — AB (ref 11–59)

## 2014-04-19 LAB — HEPATIC FUNCTION PANEL
ALT: 23 U/L (ref 0–35)
AST: 29 U/L (ref 0–37)
Albumin: 3.4 g/dL — ABNORMAL LOW (ref 3.5–5.2)
Alkaline Phosphatase: 74 U/L (ref 39–117)
BILIRUBIN TOTAL: 0.6 mg/dL (ref 0.3–1.2)
Bilirubin, Direct: <0.2 mg/dL (ref 0.0–0.3)
Total Protein: 6.7 g/dL (ref 6.0–8.3)

## 2014-04-19 LAB — BASIC METABOLIC PANEL
Anion gap: 16 — ABNORMAL HIGH (ref 5–15)
BUN: 21 mg/dL (ref 6–23)
CALCIUM: 9.7 mg/dL (ref 8.4–10.5)
CO2: 20 mEq/L (ref 19–32)
CREATININE: 0.91 mg/dL (ref 0.50–1.10)
Chloride: 113 mEq/L — ABNORMAL HIGH (ref 96–112)
GFR calc Af Amer: 64 mL/min — ABNORMAL LOW (ref >90)
GFR, EST NON AFRICAN AMERICAN: 55 mL/min — AB (ref >90)
GLUCOSE: 122 mg/dL — AB (ref 70–99)
POTASSIUM: 4.1 meq/L (ref 3.7–5.3)
Sodium: 149 mEq/L — ABNORMAL HIGH (ref 137–147)

## 2014-04-19 LAB — CBC
HEMATOCRIT: 37.9 % (ref 36.0–46.0)
Hemoglobin: 11.8 g/dL — ABNORMAL LOW (ref 12.0–15.0)
MCH: 26.9 pg (ref 26.0–34.0)
MCHC: 31.1 g/dL (ref 30.0–36.0)
MCV: 86.5 fL (ref 78.0–100.0)
Platelets: 136 10*3/uL — ABNORMAL LOW (ref 150–400)
RBC: 4.38 MIL/uL (ref 3.87–5.11)
RDW: 18.2 % — AB (ref 11.5–15.5)
WBC: 7.3 10*3/uL (ref 4.0–10.5)

## 2014-04-19 LAB — GLUCOSE, CAPILLARY
GLUCOSE-CAPILLARY: 122 mg/dL — AB (ref 70–99)
GLUCOSE-CAPILLARY: 113 mg/dL — AB (ref 70–99)
GLUCOSE-CAPILLARY: 142 mg/dL — AB (ref 70–99)
GLUCOSE-CAPILLARY: 111 mg/dL — AB (ref 70–99)
Glucose-Capillary: 110 mg/dL — ABNORMAL HIGH (ref 70–99)
Glucose-Capillary: 115 mg/dL — ABNORMAL HIGH (ref 70–99)
Glucose-Capillary: 123 mg/dL — ABNORMAL HIGH (ref 70–99)
Glucose-Capillary: 128 mg/dL — ABNORMAL HIGH (ref 70–99)

## 2014-04-19 LAB — URINE MICROSCOPIC-ADD ON

## 2014-04-19 LAB — LACTIC ACID, PLASMA: Lactic Acid, Venous: 1.4 mmol/L (ref 0.5–2.2)

## 2014-04-19 MED ORDER — METOPROLOL TARTRATE 1 MG/ML IV SOLN: 10.0000 mg | Freq: Four times a day (QID) | INTRAVENOUS | Status: DC | PRN

## 2014-04-19 MED ORDER — PANTOPRAZOLE SODIUM 40 MG PO PACK
40.0000 mg | PACK | Freq: Two times a day (BID) | ORAL | Status: DC
  Administered 2014-04-20 – 2014-04-21 (×4): 40 mg
  Filled 2014-04-19 (×5): qty 20

## 2014-04-19 MED ORDER — ASPIRIN 325 MG PO TABS
325.0000 mg | ORAL_TABLET | Freq: Every day | ORAL | Status: DC
  Administered 2014-04-20 – 2014-04-21 (×2): 325 mg
  Filled 2014-04-19 (×2): qty 1

## 2014-04-19 MED ORDER — IOHEXOL 300 MG/ML  SOLN
80.0000 mL | Freq: Once | INTRAMUSCULAR | Status: AC | PRN
  Administered 2014-04-19: 80 mL via INTRAVENOUS

## 2014-04-19 MED ORDER — FERROUS SULFATE 300 (60 FE) MG/5ML PO SYRP
300.0000 mg | ORAL_SOLUTION | Freq: Two times a day (BID) | ORAL | Status: DC
  Administered 2014-04-19 – 2014-04-21 (×4): 300 mg
  Filled 2014-04-19 (×6): qty 5

## 2014-04-19 MED ORDER — LEVALBUTEROL HCL 1.25 MG/0.5ML IN NEBU
1.2500 mg | INHALATION_SOLUTION | RESPIRATORY_TRACT | Status: DC | PRN
  Filled 2014-04-19: qty 0.5

## 2014-04-19 MED ORDER — ACETAMINOPHEN 160 MG/5ML PO SOLN
650.0000 mg | Freq: Four times a day (QID) | ORAL | Status: DC | PRN
  Filled 2014-04-19: qty 20.3

## 2014-04-19 MED ORDER — JEVITY 1.2 CAL PO LIQD
1000.0000 mL | ORAL | Status: DC
  Administered 2014-04-19 – 2014-04-21 (×3): 1000 mL
  Filled 2014-04-19 (×5): qty 1000

## 2014-04-19 MED ORDER — DEXTROSE-NACL 5-0.2 % IV SOLN
INTRAVENOUS | Status: DC
  Administered 2014-04-19: 08:00:00 via INTRAVENOUS

## 2014-04-19 MED ORDER — DOCUSATE SODIUM 50 MG/5ML PO LIQD
200.0000 mg | Freq: Two times a day (BID) | ORAL | Status: DC
  Administered 2014-04-20: 200 mg
  Filled 2014-04-19 (×3): qty 20

## 2014-04-19 MED ORDER — IOHEXOL 300 MG/ML  SOLN
25.0000 mL | INTRAMUSCULAR | Status: AC
  Administered 2014-04-19 (×2): 25 mL via ORAL

## 2014-04-19 MED ORDER — DILTIAZEM 12 MG/ML ORAL SUSPENSION
30.0000 mg | Freq: Four times a day (QID) | ORAL | Status: DC
  Administered 2014-04-19 – 2014-04-20 (×3): 30 mg
  Filled 2014-04-19 (×7): qty 3

## 2014-04-19 MED ORDER — ENALAPRIL 1 MG/ML SUSP: 5.0000 mg | Freq: Two times a day (BID) | ORAL | Status: DC

## 2014-04-19 MED ORDER — FREE WATER
200.0000 mL | Freq: Three times a day (TID) | Status: DC
  Administered 2014-04-19 – 2014-04-21 (×6): 200 mL

## 2014-04-19 MED ORDER — CITALOPRAM HYDROBROMIDE 10 MG/5ML PO SOLN
20.0000 mg | Freq: Every day | ORAL | Status: DC
  Administered 2014-04-19 – 2014-04-21 (×3): 20 mg
  Filled 2014-04-19 (×3): qty 10

## 2014-04-19 MED ORDER — POLYETHYLENE GLYCOL 3350 17 G PO PACK
17.0000 g | PACK | Freq: Every day | ORAL | Status: DC
Start: 2014-04-19 — End: 2014-04-20
  Administered 2014-04-19: 17 g
  Filled 2014-04-19 (×2): qty 1

## 2014-04-19 MED ORDER — LEVOTHYROXINE SODIUM 200 MCG PO TABS
200.0000 ug | ORAL_TABLET | Freq: Every day | ORAL | Status: DC
  Administered 2014-04-21: 200 ug
  Filled 2014-04-19 (×4): qty 1

## 2014-04-19 MED ORDER — FUROSEMIDE 8 MG/ML PO SOLN
40.0000 mg | Freq: Every day | ORAL | Status: DC
  Administered 2014-04-19 – 2014-04-20 (×2): 40 mg
  Filled 2014-04-19 (×3): qty 5

## 2014-04-19 MED ORDER — LACOSAMIDE 50 MG PO TABS
50.0000 mg | ORAL_TABLET | Freq: Two times a day (BID) | ORAL | Status: DC
  Administered 2014-04-20 – 2014-04-21 (×4): 50 mg
  Filled 2014-04-19 (×5): qty 1

## 2014-04-19 MED ORDER — VITAMIN B-12 1000 MCG PO TABS
1000.0000 ug | ORAL_TABLET | Freq: Every day | ORAL | Status: DC
  Administered 2014-04-19 – 2014-04-21 (×3): 1000 ug
  Filled 2014-04-19 (×3): qty 1

## 2014-04-19 MED ORDER — FERROUS SULFATE 220 (44 FE) MG/5ML PO ELIX
220.0000 mg | ORAL_SOLUTION | Freq: Two times a day (BID) | ORAL | Status: DC
  Filled 2014-04-19 (×2): qty 5

## 2014-04-19 MED ORDER — ENALAPRIL MALEATE 5 MG PO TABS
5.0000 mg | ORAL_TABLET | Freq: Two times a day (BID) | ORAL | Status: DC
  Administered 2014-04-20 – 2014-04-21 (×4): 5 mg
  Filled 2014-04-19 (×5): qty 1

## 2014-04-19 NOTE — Progress Notes (Signed)
NUTRITION FOLLOW UP/CONSULT FOR TF MANAGEMENT  Intervention:   - Initiate adult enteral protocol - Start TF via panda tube of Jevity 1.2 start at 64ml/hr, increase by 23ml every 4 hours to goal of 64ml/hr. Goal rate will provide 1440 calories, 67g protein, and 947ml free water and meet 100% of estimated nutritional needs. If IVF d/c, recommend 178ml water flushes 4 times/day - RD to monitor plan of care   Nutrition Dx:   Malnutrition related to inadequate oral intake as evidenced by severe depletion of muscle and subcutaneous fat mass - ongoing   Goal:   Intake to meet >90% of estimated nutrition needs - not met, NPO  New goal: TF to meet >90% of estimated nutritional needs   Monitor:   Weights, labs, TF tolerance  Assessment:   80 y.o. female SNF resident with multiple medical problems including CVA/TIA, Afib on xarelto, HTN, Dementia, Hypothyroidism who presents with unresponsiveness. Per family pt was found slumped to right in her wheelchair at SNF, and son relates that she had jerking activity. Code stroke was called prior to arrival. While in ED she had a grandmal seizure>> she was given IV ativan and then loaded with IV keppra per Neuro, and pt has not had further seizures. Head CT neg for acute findings, severe chronic involutional change noted.   7/1: Patient's family reports that patient was changed to a pureed diet 1-2 months ago because the SNF lost her dentures and she could not eat the regular food. Since diet changed to pureed foods, she has been eating very well and has gained 2 lbs. She likes chocolate Boost supplements, drinks at least one per day.  7/4: Had SLP bedside swallow evaluation 7/1 and was noted to have severe aspiration risk. Per MD notes, family demanding TPN if pt failed swallow evaluation however MD explained TF more appropriate for pt. Seen by SLP today who again recommended NPO and RD received consult for TF initiation and management. Panda tube placed  this morning.    Height: Ht Readings from Last 1 Encounters:  04/16/14 $RemoveB'5\' 4"'EtXsfQaz$  (1.626 m)    Weight Status:   Wt Readings from Last 1 Encounters:  04/19/14 98 lb 12.3 oz (44.8 kg)  Admit wt         100 lb 1.4 oz (45.4 kg)  Re-estimated needs:  Kcal: 1300-1500  Protein: 65-80 gm  Fluid: 1.5 L   Skin: WDL   Diet Order: NPO   Intake/Output Summary (Last 24 hours) at 04/19/14 1112 Last data filed at 04/19/14 1006  Gross per 24 hour  Intake    630 ml  Output      0 ml  Net    630 ml    Last BM: 7/4   Labs:   Recent Labs Lab 04/17/14 0240 04/18/14 0455 04/19/14 0340  NA 144 146 149*  K 3.9 3.9 4.1  CL 107 107 113*  CO2 $Re'25 24 20  'xVU$ BUN $R'23 23 21  'IB$ CREATININE 0.99 0.97 0.91  CALCIUM 9.7 10.0 9.7  GLUCOSE 64* 133* 122*    CBG (last 3)   Recent Labs  04/19/14 0422 04/19/14 0552 04/19/14 0747  GLUCAP 113* 123* 115*    Scheduled Meds: . aspirin  300 mg Rectal Daily  . cefTRIAXone (ROCEPHIN)  IV  1 g Intravenous Q24H  . cloNIDine  0.1 mg Transdermal Weekly  . fluticasone  1 spray Each Nare QHS  . lacosamide (VIMPAT) IV  50 mg Intravenous Q12H  . levothyroxine  100 mcg Intravenous Daily  . metoprolol  10 mg Intravenous 4 times per day  . pantoprazole (PROTONIX) IV  40 mg Intravenous Daily    Continuous Infusions: . dextrose 5 % and 0.2 % NaCl 75 mL/hr at 04/19/14 0809     Carlis Stable MS, RD, LDN [phone] Weekend/After Hours Pager

## 2014-04-19 NOTE — Progress Notes (Addendum)
[facility] PROGRESS NOTE  [name] [name] WGN:[id number] DOB: [birth year] DOA: 04/15/2014 PCP: Oneal GroutPANDEY, MAHIMA, MD  Assessment/Plan  Seizures, New onset.  May have been triggered by underlying infection, but no previous seizure history.  Changed from keppra to vimpat 7/2 by neurology due to persistent confusion, babbling speech.   -  Appreciate neurology assistance -  Head CT neg -  Change to IV vimpat because patient was not awake enough to take evening dose last night -  EEG with slowing but without epileptiform activity -  MRI demonstrates no acute finding. 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 - continue ceftriaxone, day 5 of 5 of antibiotics  Atrial fibrillation RVR, HR around 100bpm - change metop 10 mg IV q6h to prn - xarelto cannot be crushed -  Continue daily aspirin -  Resume dilt  Malignant HTN, BP still elevated,  -  Start dilt  -  Start enalapril -  Change metoprolol to prn HR -  Started clonidine patch 7/3 -  Continue prn hydralazine  HYPOTHYROIDISM, TSH likely to be abnl in setting of acute illness  -transition back to enteric synthroid  GERD (gastroesophageal reflux disease), stable, continue PPI  Severe protein calorie malnutrition, family demanding TPN because this is a form of nutrition they are familiar with.  Spoke at length with HPOA this morning who again reaffirmed family would like TPN.  We had never before discussed risks and benefits and options, but did so this  morning.  Discussed PICC vs. Central line for TPN vs. NG tube to bridge to possible PEG tube if family desired.  Recommended NG tube for nutrition because 1. Less risk of blood stream infection and clots, 2. Better ability to give nutrition, 3. Ability to give additional medications. 4.  Ability to administer oral contrast.   -  SLP to reevaluate daily -  Family okay with panda for now  Hypernatremia due to free water deficit.  Had been getting D51/2 NS, however, sodium creeping up -  Change to D5 0.2 NS and increase rate:  Will D/C now that panda placed -  Start temporary FW flushes 200mL q8h  -  Repeat BMP in AM  Hypoglycemia due to poor oral intake and low reserves -  D/c dextrose fluids now that tube feeds runnding -  Continue CBG  Depression/dementia, resume celexa   Normocytic anemia, likely partly hemodilutional and in the setting of known iron deficiency and B12 deficiency anemia,  Hemoglobin stable.   -  Resume iron and B12 supplements   Diet:  NPO  Access:  PIV IVF:  yes Proph:  SCDs  Code Status: DNR Family Communication: patient and daughter Disposition Plan:  Telemetry.  Family adamantly opposed to palliative care and hospice per my conversation and based on RN notes, although we had never formally discussed the need for hospice or palliation.    Consultants:  Neurology  Procedures:  CT head  EEG  MRI brain  Antibiotics:  cipro  6/30 >> 7/1  Ceftriaxone 7/1 >>   HPI/Subjective:  Awake, alert, still talking gibberish, dry MM.    Objective: Filed Vitals:   04/18/14 1800 04/18/14 2021 04/18/14 2342 04/19/14 0426  BP: 144/89 157/87 173/113 163/94  Pulse: 92 99 112 100  Temp:  98.8 F (37.1 C)  97.8 F (36.6 C)  TempSrc:  Axillary  Oral  Resp:  17  16  Height:      Weight:    44.8 kg (98 lb 12.3 oz)  SpO2:  94% 99% 99%    Intake/Output Summary (Last 24 hours) at 04/19/14 0812 Last data filed at 04/19/14 0557  Gross per 24 hour  Intake    630  ml  Output      0 ml  Net    630 ml   Filed Weights   04/16/14 1445 04/19/14 0426  Weight: 45.4 kg (100 lb 1.4 oz) 44.8 kg (98 lb 12.3 oz)    Exam:   General:  Cachectic CF, No acute distress, awake, alert, speaks jibberish   HEENT:  NCAT, MMM  Cardiovascular:  IRRR, nl S1, S2 no mrg, 2+ pulses, warm extremities  Respiratory:  CTAB anteriorly, no increased WOB  Abdomen:   NABS, soft, ND, TTP in the LLQ without rebound or guarding.  Feels soft, no obvious stool mass there.  MSK:   Increased tone and decreased bulk, no LEE, contractures bilateral lower extremities  Neuro:  Diffusely weak, but able to spontaneously move all extremities.    Data Reviewed: Basic Metabolic Panel:  Recent Labs Lab 04/15/14 1344 04/15/14 1400 04/16/14 0139 04/17/14 0240 04/18/14 0455 04/19/14 0340  NA 141 140 141 144 146 149*  K 4.3 4.2 4.1 3.9 3.9 4.1  CL 98 102 101 107 107 113*  CO2 25  --  25 25 24 20   GLUCOSE 110* 108* 87 64* 133* 122*  BUN 27* 28* 22 23 23 21   CREATININE 0.91 1.10 0.89 0.99 0.97 0.91  CALCIUM 10.1  --  9.4 9.7 10.0 9.7   Liver Function Tests:  Recent Labs Lab 04/15/14 1344  AST 22  ALT 16  ALKPHOS 91  BILITOT 0.5  PROT 8.0  ALBUMIN 4.1   No results found for this basename: LIPASE, AMYLASE,  in the last 168 hours  Recent Labs Lab 04/18/14 0935  AMMONIA 22   CBC:  Recent Labs Lab 04/15/14 1344 04/15/14 1400 04/16/14 0139 04/17/14 0240 04/18/14 0455  WBC 6.8  --  7.5 6.5 6.9  NEUTROABS 4.2  --   --   --   --   HGB 13.6 15.6* 10.8* 11.4* 11.5*  HCT 43.9 46.0 34.4* 37.3 36.7  MCV 85.4  --  84.5 85.4 84.0  PLT 190  --  158 156 165   Cardiac Enzymes:  Recent Labs Lab 04/16/14 0139 04/16/14 0520  TROPONINI <0.30 <0.30   BNP (last 3 results) No results found for this basename: PROBNP,  in the last 8760 hours CBG:  Recent Labs Lab 04/19/14 0014 04/19/14 0159 04/19/14 0422 04/19/14 0552 04/19/14 0747  GLUCAP 128* 122* 113* 123*  115*    Recent Results (from the past 240 hour(s))  MRSA PCR SCREENING     Status: None   Collection Time    04/15/14  7:14 PM      Result Value Ref Range Status   MRSA by PCR NEGATIVE  NEGATIVE Final   Comment:            The GeneXpert  MRSA Assay (FDA     approved for NASAL specimens     only), is one component of a     comprehensive MRSA colonization     surveillance program. It is not     intended to diagnose MRSA     infection nor to guide or     monitor treatment for     MRSA infections.     Studies: No results found.  Scheduled Meds: . aspirin  300 mg Rectal Daily  . cefTRIAXone (ROCEPHIN)  IV  1 g Intravenous Q24H  . cloNIDine  0.1 mg Transdermal Weekly  . fluticasone  1 spray Each Nare QHS  . lacosamide (VIMPAT) IV  50 mg Intravenous Q12H  . levothyroxine  100 mcg Intravenous Daily  . metoprolol  10 mg Intravenous 4 times per day  . pantoprazole (PROTONIX) IV  40 mg Intravenous Daily   Continuous Infusions: . dextrose 5 % and 0.2 % NaCl 75 mL/hr at 04/19/14 0809    Active Problems:   HYPOTHYROIDISM   Atrial fibrillation   Protein malnutrition   GERD (gastroesophageal reflux disease)   Seizures   Hypertensive urgency   Dementia   Protein-calorie malnutrition, severe    Time spent: 30 min    Nakaya Mishkin, [facility]  [facility] Pager [phone]. If 7PM-7AM, please contact night-coverage at [email or web address], password [facility] 04/19/2014, 8:12 AM  LOS: 4 days

## 2014-04-19 NOTE — Progress Notes (Signed)
Speech Language Pathology Treatment: Dysphagia  Patient Details Name: [name] BeringDorothy [name] MRN: [id number] DOB: [birth year] Today's Date: 04/19/2014 Time: [id number] SLP Time Calculation (min): 25 min  Assessment / Plan / Recommendation Clinical Impression  Skilled dysphagia treatment included oral care provided prior to po trials; pt exhibited decreased awareness of bolus in oral cavity (ie: puree, ice chips) with no swallow initiation observed and oral cavity being cleared after 1/2 tsp amount of puree/single ice chip delivered with max tactile/verbal/visual cueing provided; son present during swallow re-assessment; discussed reasoning for NPO status with him and nursing; ST to re-assess swallow on 04/20/14 to determine if po feeding is indicated or non-oral feeding may be indicated at this time; medication should be administered via puree/crushed if pt is alert and able or via IV   HPI HPI: 80 y.o. female with history of atrial fibrillation on Xarelto, hypertension, previous stroke, dementia and hypothyroidism was brought to the emergency room with acute change in mental status at the facility where she resides.  Dx of new onset focal seizure activity with secondary generalization.  CT scan of her head was unremarkable.  Per RD note, pt's diet at SNF changed recently to pureed; since that time she has been eating very well and has gained 2 lbs.     Pertinent Vitals Afebrile; no c/o or observations of pain during session  SLP Plan  Continue with current plan of care    Recommendations Diet recommendations: NPO Medication Administration: Via alternative means              Oral Care Recommendations: Oral care Q4 per protocol Follow up Recommendations: Skilled Nursing facility;24 hour supervision/assistance Plan: Continue with current plan of care         Quandarius Nill,PAT, M.S., CCC-SLP 04/19/2014, 10:16 AM

## 2014-04-19 NOTE — Progress Notes (Signed)
Oral CT contrast finished, radiology notified Archie BalboaStein, Nykeem Citro G, RN

## 2014-04-19 NOTE — Progress Notes (Signed)
Subjective: Patient much more alert today.  Speaks constantly but dysarthric.    Objective: Current vital signs: BP 168/123  Pulse 99  Temp(Src) 97.8 F (36.6 C) (Oral)  Resp 16  Ht 5\' 4"  (1.626 m)  Wt 44.8 kg (98 lb 12.3 oz)  BMI 16.94 kg/m2  SpO2 99% Vital signs in last 24 hours: Temp:  [97.8 F (36.6 C)-98.8 F (37.1 C)] 97.8 F (36.6 C) (07/04 0426) Pulse Rate:  [92-130] 99 (07/04 0848) Resp:  [16-18] 16 (07/04 0426) BP: (144-199)/(87-153) 168/123 mmHg (07/04 0848) SpO2:  [94 %-99 %] 99 % (07/04 0426) Weight:  [44.8 kg (98 lb 12.3 oz)] 44.8 kg (98 lb 12.3 oz) (07/04 0426)  Intake/Output from previous day: 07/03 0701 - 07/04 0700 In: 1772.5 [I.V.:1742.5; IV Piggyback:30] Out: -  Intake/Output this shift:   Nutritional status: NPO  Neurologic Exam: Mental Status:  Opens eyes when name called.  Speaks constantly, much of which is able to be understood.  Dysarthric.  Follows minimal commands. Tracks me around the room.   Cranial Nerves:  II-responded to visual threat bilaterally.  III/IV/VI-Pupils were equal and reactive. She was able to move eyes conjugately to the right as well as to the left and response to verbal stimulation, although gaze was not full.  VII-no facial numbness and no facial weakness.  VII-grossly intact  IX/X: intact gag  XI: bilateral shoulder shrug  ZOX:WRUEAVXII:unable to perform  Motor: Able to move all extremities, upper extremities greater than lower extremities.  Sensory: Responds to noxious stimuli throughout  Deep Tendon Reflexes: Trace only on the left and 1+ on the right.  Plantars: Mute bilaterally    Lab Results: Basic Metabolic Panel:  Recent Labs Lab 04/15/14 1344 04/15/14 1400 04/16/14 0139 04/17/14 0240 04/18/14 0455 04/19/14 0340  NA 141 140 141 144 146 149*  K 4.3 4.2 4.1 3.9 3.9 4.1  CL 98 102 101 107 107 113*  CO2 25  --  25 25 24 20   GLUCOSE 110* 108* 87 64* 133* 122*  BUN 27* 28* 22 23 23 21   CREATININE 0.91 1.10  0.89 0.99 0.97 0.91  CALCIUM 10.1  --  9.4 9.7 10.0 9.7    Liver Function Tests:  Recent Labs Lab 04/15/14 1344  AST 22  ALT 16  ALKPHOS 91  BILITOT 0.5  PROT 8.0  ALBUMIN 4.1   No results found for this basename: LIPASE, AMYLASE,  in the last 168 hours  Recent Labs Lab 04/18/14 0935  AMMONIA 22    CBC:  Recent Labs Lab 04/15/14 1344 04/15/14 1400 04/16/14 0139 04/17/14 0240 04/18/14 0455  WBC 6.8  --  7.5 6.5 6.9  NEUTROABS 4.2  --   --   --   --   HGB 13.6 15.6* 10.8* 11.4* 11.5*  HCT 43.9 46.0 34.4* 37.3 36.7  MCV 85.4  --  84.5 85.4 84.0  PLT 190  --  158 156 165    Cardiac Enzymes:  Recent Labs Lab 04/16/14 0139 04/16/14 0520  TROPONINI <0.30 <0.30    Lipid Panel: No results found for this basename: CHOL, TRIG, HDL, CHOLHDL, VLDL, LDLCALC,  in the last 168 hours  CBG:  Recent Labs Lab 04/19/14 0014 04/19/14 0159 04/19/14 0422 04/19/14 0552 04/19/14 0747  GLUCAP 128* 122* 113* 123* 115*    Microbiology: Results for orders placed during the hospital encounter of 04/15/14  MRSA PCR SCREENING     Status: None   Collection Time    04/15/14  7:14 PM      Result Value Ref Range Status   MRSA by PCR NEGATIVE  NEGATIVE Final   Comment:            The GeneXpert MRSA Assay (FDA     approved for NASAL specimens     only), is one component of a     comprehensive MRSA colonization     surveillance program. It is not     intended to diagnose MRSA     infection nor to guide or     monitor treatment for     MRSA infections.    Coagulation Studies: No results found for this basename: LABPROT, INR,  in the last 72 hours  Imaging: No results found.  Medications:  I have reviewed the patient's current medications. Scheduled: . aspirin  300 mg Rectal Daily  . cefTRIAXone (ROCEPHIN)  IV  1 g Intravenous Q24H  . cloNIDine  0.1 mg Transdermal Weekly  . fluticasone  1 spray Each Nare QHS  . lacosamide (VIMPAT) IV  50 mg Intravenous Q12H  .  levothyroxine  100 mcg Intravenous Daily  . metoprolol  10 mg Intravenous 4 times per day  . pantoprazole (PROTONIX) IV  40 mg Intravenous Daily    Assessment/Plan: Patient much more interactive today.  Not as lethargic.  May be clearing the Keppra better.  Remains on Vimpat.    Recommendations: 1.  Continue Vimpat at current dose.     LOS: 4 days   Thana FarrLeslie Romone Shaff, MD [facility] [phone] 04/19/2014  9:46 AM

## 2014-04-19 NOTE — Progress Notes (Signed)
Panada placement confirmed with xray, stylet removed, oral contrast started Archie BalboaStein, Tallie Dodds G, RN

## 2014-04-19 NOTE — Progress Notes (Signed)
Safety mitts placed on pt [name] BalboaStein, [name] [name] G, RN

## 2014-04-19 NOTE — Progress Notes (Signed)
Panda tube inserted, pt tolerated well, placement confirmed with air bolus and co2 monitor, xray ordered for confirmation Archie BalboaStein, Leland Staszewski G, RN

## 2014-04-20 DIAGNOSIS — A0472 Enterocolitis due to Clostridium difficile, not specified as recurrent: Secondary | ICD-10-CM | POA: Insufficient documentation

## 2014-04-20 DIAGNOSIS — K5641 Fecal impaction: Secondary | ICD-10-CM

## 2014-04-20 LAB — CBC
HEMATOCRIT: 36.3 % (ref 36.0–46.0)
HEMOGLOBIN: 11.3 g/dL — AB (ref 12.0–15.0)
MCH: 26.5 pg (ref 26.0–34.0)
MCHC: 31.1 g/dL (ref 30.0–36.0)
MCV: 85.2 fL (ref 78.0–100.0)
Platelets: 135 10*3/uL — ABNORMAL LOW (ref 150–400)
RBC: 4.26 MIL/uL (ref 3.87–5.11)
RDW: 18.4 % — ABNORMAL HIGH (ref 11.5–15.5)
WBC: 9.3 10*3/uL (ref 4.0–10.5)

## 2014-04-20 LAB — GLUCOSE, CAPILLARY
GLUCOSE-CAPILLARY: 104 mg/dL — AB (ref 70–99)
GLUCOSE-CAPILLARY: 121 mg/dL — AB (ref 70–99)
Glucose-Capillary: 107 mg/dL — ABNORMAL HIGH (ref 70–99)
Glucose-Capillary: 104 mg/dL — ABNORMAL HIGH (ref 70–99)
Glucose-Capillary: 107 mg/dL — ABNORMAL HIGH (ref 70–99)

## 2014-04-20 LAB — URINE CULTURE
Colony Count: NO GROWTH
Culture: NO GROWTH

## 2014-04-20 LAB — BASIC METABOLIC PANEL
Anion gap: 14 (ref 5–15)
BUN: 19 mg/dL (ref 6–23)
CALCIUM: 9.2 mg/dL (ref 8.4–10.5)
CHLORIDE: 106 meq/L (ref 96–112)
CO2: 25 meq/L (ref 19–32)
Creatinine, Ser: 0.88 mg/dL (ref 0.50–1.10)
GFR calc Af Amer: 67 mL/min — ABNORMAL LOW (ref >90)
GFR calc non Af Amer: 57 mL/min — ABNORMAL LOW (ref >90)
GLUCOSE: 114 mg/dL — AB (ref 70–99)
Potassium: 3.3 mEq/L — ABNORMAL LOW (ref 3.7–5.3)
SODIUM: 145 meq/L (ref 137–147)

## 2014-04-20 LAB — MAGNESIUM: Magnesium: 1.9 mg/dL (ref 1.5–2.5)

## 2014-04-20 LAB — CLOSTRIDIUM DIFFICILE BY PCR: CDIFFPCR: POSITIVE — AB

## 2014-04-20 LAB — PHOSPHORUS: Phosphorus: 2.6 mg/dL (ref 2.3–4.6)

## 2014-04-20 MED ORDER — DILTIAZEM 12 MG/ML ORAL SUSPENSION
60.0000 mg | Freq: Four times a day (QID) | ORAL | Status: DC
  Administered 2014-04-20 – 2014-04-21 (×5): 60 mg
  Filled 2014-04-20 (×8): qty 6

## 2014-04-20 MED ORDER — METRONIDAZOLE 500 MG PO TABS
500.0000 mg | ORAL_TABLET | Freq: Three times a day (TID) | ORAL | Status: DC
  Administered 2014-04-20 – 2014-04-21 (×3): 500 mg
  Filled 2014-04-20 (×6): qty 1

## 2014-04-20 MED ORDER — POTASSIUM CHLORIDE 20 MEQ/15ML (10%) PO LIQD
40.0000 meq | Freq: Every day | ORAL | Status: DC
  Administered 2014-04-20: 40 meq
  Filled 2014-04-20 (×3): qty 30

## 2014-04-20 MED ORDER — SACCHAROMYCES BOULARDII 250 MG PO CAPS
250.0000 mg | ORAL_CAPSULE | Freq: Two times a day (BID) | ORAL | Status: DC
  Administered 2014-04-20 – 2014-04-23 (×7): 250 mg via ORAL
  Filled 2014-04-20 (×8): qty 1

## 2014-04-20 NOTE — Progress Notes (Signed)
Subjective: Patient improved.  Speech intelligible and patient able to follow commands.    Objective: Current vital signs: BP 128/82  Pulse 87  Temp(Src) 98.2 F (36.8 C) (Oral)  Resp 18  Ht 5\' 4"  (1.626 m)  Wt 44.8 kg (98 lb 12.3 oz)  BMI 16.94 kg/m2  SpO2 100% Vital signs in last 24 hours: Temp:  [97.4 F (36.3 C)-98.2 F (36.8 C)] 98.2 F (36.8 C) (07/05 0337) Pulse Rate:  [50-114] 87 (07/05 0758) Resp:  [17-18] 18 (07/05 0337) BP: (128-174)/(82-119) 128/82 mmHg (07/05 0758) SpO2:  [95 %-100 %] 100 % (07/05 0337) FiO2 (%):  [2 %] 2 % (07/04 1415) Weight:  [44.8 kg (98 lb 12.3 oz)] 44.8 kg (98 lb 12.3 oz) (07/05 0337)  Intake/Output from previous day: 07/04 0701 - 07/05 0700 In: 720 [NG/GT:720] Out: -  Intake/Output this shift:   Nutritional status: NPO  Neurologic Exam: Mental Status:  Patient awake and alert.  Speech fluent.  On most occasions responses are appropriate but patient does speak often of needing to cool off and wanting cold water.  Follows simple commands.    Cranial Nerves:  II-responds to visual threat bilaterally.  III/IV/VI-Pupils were equal and reactive. EOM's intact VII-no facial numbness and no facial weakness.  VII-grossly intact  IX/X: intact gag  XI: bilateral shoulder shrug  ZOX:WRUEAV to perform  Motor: Able to move all extremities, upper extremities greater than lower extremities.  Sensory: Responds to noxious stimuli throughout  Plantars: Mute bilaterally    Lab Results: Basic Metabolic Panel:  Recent Labs Lab 04/16/14 0139 04/17/14 0240 04/18/14 0455 04/19/14 0340 04/20/14 0344  NA 141 144 146 149* 145  K 4.1 3.9 3.9 4.1 3.3*  CL 101 107 107 113* 106  CO2 25 25 24 20 25   GLUCOSE 87 64* 133* 122* 114*  BUN 22 23 23 21 19   CREATININE 0.89 0.99 0.97 0.91 0.88  CALCIUM 9.4 9.7 10.0 9.7 9.2  MG  --   --   --   --  1.9  PHOS  --   --   --   --  2.6    Liver Function Tests:  Recent Labs Lab 04/15/14 1344  04/19/14 1035  AST 22 29  ALT 16 23  ALKPHOS 91 74  BILITOT 0.5 0.6  PROT 8.0 6.7  ALBUMIN 4.1 3.4*    Recent Labs Lab 04/19/14 1035  LIPASE 68*    Recent Labs Lab 04/18/14 0935  AMMONIA 22    CBC:  Recent Labs Lab 04/15/14 1344  04/16/14 0139 04/17/14 0240 04/18/14 0455 04/19/14 1035 04/20/14 0344  WBC 6.8  --  7.5 6.5 6.9 7.3 9.3  NEUTROABS 4.2  --   --   --   --   --   --   HGB 13.6  < > 10.8* 11.4* 11.5* 11.8* 11.3*  HCT 43.9  < > 34.4* 37.3 36.7 37.9 36.3  MCV 85.4  --  84.5 85.4 84.0 86.5 85.2  PLT 190  --  158 156 165 136* 135*  < > = values in this interval not displayed.  Cardiac Enzymes:  Recent Labs Lab 04/16/14 0139 04/16/14 0520  TROPONINI <0.30 <0.30    Lipid Panel: No results found for this basename: CHOL, TRIG, HDL, CHOLHDL, VLDL, LDLCALC,  in the last 168 hours  CBG:  Recent Labs Lab 04/19/14 2042 04/20/14 0026 04/20/14 0344 04/20/14 0711 04/20/14 1115  GLUCAP 111* 104* 107* 104* 121*    Microbiology: Results for  orders placed during the hospital encounter of 04/15/14  MRSA PCR SCREENING     Status: None   Collection Time    04/15/14  7:14 PM      Result Value Ref Range Status   MRSA by PCR NEGATIVE  NEGATIVE Final   Comment:            The GeneXpert MRSA Assay (FDA     approved for NASAL specimens     only), is one component of a     comprehensive MRSA colonization     surveillance program. It is not     intended to diagnose MRSA     infection nor to guide or     monitor treatment for     MRSA infections.  URINE CULTURE     Status: None   Collection Time    04/19/14  9:09 AM      Result Value Ref Range Status   Specimen Description URINE, CATHETERIZED   Final   Special Requests NONE   Final   Culture  Setup Time     Final   Value: 04/19/2014 11:55     Performed at [facility]   Colony Count     Final   Value: NO GROWTH     [facility]formed at [facility]   Culture     Final   Value: NO GROWTH      Performed at [facility]   Report Status 04/20/2014 FINAL   Final  CLOSTRIDIUM DIFFICILE BY PCR     Status: Abnormal   Collection Time    04/19/14 11:37 PM      Result Value Ref Range Status   C difficile by pcr POSITIVE (*) NEGATIVE Final   Comment: CRITICAL RESULT CALLED TO, READ BACK BY AND VERIFIED WITH:     STEIN,O RN 04/20/14 1020 WOOTEN,K    Coagulation Studies: No results found for this basename: LABPROT, INR,  in the last 72 hours  Imaging: Ct Abdomen Pelvis W Contrast  04/19/2014   CLINICAL DATA:  Left lower quadrant abdominal pain.  EXAM: CT ABDOMEN AND PELVIS WITH CONTRAST  TECHNIQUE: Multidetector CT imaging of the abdomen and pelvis was performed using the standard protocol following bolus administration of intravenous contrast.  CONTRAST:  80mL OMNIPAQUE IOHEXOL 300 MG/ML  SOLN  COMPARISON:  03/13/2011  FINDINGS: The lung bases demonstrate moderate-sized bilateral pleural effusions, right greater than left with overlying atelectasis. The heart is enlarged. No pericardial effusion. There is an NG tube coursing down the esophagus and into the stomach.  The liver is unremarkable. No focal hepatic lesions or intrahepatic biliary dilatation. There is reflux of contrast down the IVC and into the hepatic veins and there may be a component of chronic hepatic congestion. The spleen is normal in size. No focal lesions. The adrenal glands are unremarkable. The kidneys demonstrate scarring changes and cysts.  The stomach, duodenum, small bowel and colon are grossly normal. There is a large amount of stool in the rectum and pneumatosis consistent with fecal impaction. Diffuse colonic diverticulosis without findings for acute diverticulitis. No mesenteric or retroperitoneal mass or adenopathy. The aorta demonstrates advanced atherosclerotic calcifications. The major branch vessels are patent. No focal aneurysm or dissection. Advanced iliac artery calcifications are noted.  The bladder is  unremarkable. The uterus and ovaries are grossly normal.  Advanced degenerative changes noted in the spine but no acute bony findings.  IMPRESSION: Markedly distended stool-filled rectum with pneumatosis consistent with fecal impaction.  Colonic diverticulosis without findings for acute diverticulitis.  Bilateral pleural effusions with overlying atelectasis.  Cardiac enlargement.   Electronically Signed   By: Loralie ChampagneMark  Gallerani M.D.   On: 04/19/2014 15:09   Dg Abd Portable 1v  04/19/2014   CLINICAL DATA:  NG tube confirmation  EXAM: PORTABLE ABDOMEN - 1 VIEW  COMPARISON:  None.  FINDINGS: Enteric tube tip projecting in the region of the stomach. The bowel gas pattern is normal. No radio-opaque calculi or other significant radiographic abnormality are seen. Large amount of stool in the region the rectosigmoid colon.  IMPRESSION: Large amount of fecal retention in the region the rectosigmoid colon. Nonobstructive bowel gas pattern. Enteric tube tip projecting region stomach.   Electronically Signed   By: Salome HolmesHector  Cooper M.D.   On: 04/19/2014 11:44    Medications:  I have reviewed the patient's current medications. Scheduled: . aspirin  325 mg Per Tube Daily  . citalopram  20 mg Per Tube Daily  . diltiazem  60 mg Per Tube 4 times per day  . enalapril  5 mg Per Tube BID  . ferrous sulfate  300 mg Per Tube BID WC  . fluticasone  1 spray Each Nare QHS  . free water  200 mL Per Tube 3 times per day  . furosemide  40 mg Per Tube Daily  . lacosamide  50 mg Per Tube BID  . levothyroxine  200 mcg Per Tube QAC breakfast  . metroNIDAZOLE  500 mg Per Tube 3 times per day  . pantoprazole sodium  40 mg Per Tube BID  . potassium chloride  40 mEq Per Tube Daily  . saccharomyces boulardii  250 mg Oral BID  . vitamin B-12  1,000 mcg Per Tube Daily    Assessment/Plan: Patient continues to improve.  No further seizure activity noted.  On Vimpat.    Recommendations: 1.  Continue Vimpat at current dose   LOS: 5  days   Thana FarrLeslie Danyael Alipio, MD [facility] [phone] 04/20/2014  1:56 PM

## 2014-04-20 NOTE — Progress Notes (Signed)
Pts daughter at bedside on the phone and stated "She took sips of water out of a straw today that I gave her", daughter firmly advised that pt is NPO and to NOT take anything by mouth, daughter advised about the dangers of aspiration, will continue to educate and monitor [name] [name], [name] Looper G, RN

## 2014-04-20 NOTE — Progress Notes (Addendum)
Palliative medicine consult received and chart reviewed. Patient and her family are well known to me from [facility] prior to [facility] where I was her primary care physician for over 4 years. I have had several prior goals of care discussions with her daughter Gavin PoundDeborah and her son [name]. I will schedule a meeting with [name] on Monday 7/6. Until I can solidify goals of care she may have sips of liquids and ice chips with aspiration precautions for her comfort.  Anderson MaltaElizabeth Golding, DO Palliative Medicine [phone]

## 2014-04-20 NOTE — Progress Notes (Addendum)
[facility] PROGRESS NOTE  [name] BeringDorothy [name] WUJ:[id number] DOB: 1924-05-13 DOA: 04/15/2014 PCP: Oneal GroutPANDEY, MAHIMA, MD  Assessment/Plan  Seizures, New onset.  May have been triggered by underlying infection, but no previous seizure history.  Changed from keppra to vimpat 7/2 by neurology due to persistent confusion, babbling speech.   -  Appreciate neurology assistance -  Head CT neg -  EEG with slowing but without epileptiform activity -  MRI demonstrates no acute finding. She has chronic small vessel ischemic changes an old small infarctions affecting the left cerebellum and left thalamus -  Ammonia level 22 -  Continue vimpat  Stool impaction with LLQ abdominal pain -  LFTs, lipase, lactic acid, and repeat UA/UCx:  Lipase mildly elevated but nonspecific -  CBC:  WBC stable -  CT abd/pelvis:  Stool impaction -  Constipation resolved with disimpaction, colace, miralax  Diarrhea, likely secondary to recent impaction and starting tube feeds -  D/c colace and miralax -  C. Diff pending -  Enteric precautions -  Consider imodium if not improving  UTI, continue ceftriaxone, day 5 of 7 of antibiotics  C. Diff,  -  Day 1 of Flagyl  Atrial fibrillation RVR, HR around 80 bpm and no episodes of bradycardia or tachycardia -  d/c IV metop -  Increase dilt  -  Resume xarelto if able to resume PO -  Continue daily aspirin -  D/c telemetry  Malignant HTN, BP still elevated,  -  Increase dilt  -  Continue enalapril -  D/c clonidine patch  -  Continue prn hydralazine  HYPOTHYROIDISM, TSH likely to be abnl in setting of acute illness  - continue synthroid - repeat TFTs in 2-4 weeks  GERD (gastroesophageal reflux disease), stable, continue PPI  Severe protein calorie malnutrition in setting of severe dysphagia -  SLP to reevaluate daily -  Appreciate nutrition assistance -  Panda for a few days to see if mentation recovers -  Palliative care consult Monday  Hypernatremia due to  free water deficit.  Sodium trending down -  Continue temporary FW flushes 200mL q8h today and will reduce tomorrow if sodium nearer 140. -  Repeat BMP in AM  Hypoglycemia due to poor oral intake and low reserves -  D/c dextrose fluids now that tube feeds runnding -  Continue CBG  Depression/dementia, stable, continue celexa   Iron and B12 deficiency anemia, Hemoglobin stable.   -  Continue iron and B12 supplements   Diet:  NPO with tube feeds Access:  PIV IVF:  yes Proph:  SCDs  Code Status: DNR Family Communication: patient and daughter Disposition Plan:  Will allow a few more days for mentation to improve with daily speech evals.  Palliative care to meet with family this week in case her dysphagia persists.   Consultants:  Neurology  Palliative care  Procedures:  CT head  EEG  MRI brain  Panda 7/4  CT abd/pelvis 7/4  KUB 7/4  Antibiotics:  Cipro 6/30 >> 7/1  Ceftriaxone 7/1 >> 7/5  HPI/Subjective:  More coherent today, but sleepier.    Objective: Filed Vitals:   04/19/14 1415 04/19/14 2031 04/20/14 0337 04/20/14 0758  BP: 174/119 158/97 137/96 128/82  Pulse: 50 114 103 87  Temp: 97.7 F (36.5 C) 97.4 F (36.3 C) 98.2 F (36.8 C)   TempSrc: Oral Oral Oral   Resp: 17 18 18    Height:      Weight:   44.8 kg (98 lb 12.3 oz)  SpO2: 99% 95% 100%     Intake/Output Summary (Last 24 hours) at 04/20/14 0821 Last data filed at 04/20/14 0744  Gross per 24 hour  Intake    720 ml  Output      0 ml  Net    720 ml   Filed Weights   04/16/14 1445 04/19/14 0426 04/20/14 0337  Weight: 45.4 kg (100 lb 1.4 oz) 44.8 kg (98 lb 12.3 oz) 44.8 kg (98 lb 12.3 oz)    Exam:   General:  Cachectic CF, No acute distress, asleep but arouseable.  When asked how she is doing, she responds "not well."   HEENT:  NCAT, MMM  Cardiovascular:  IRRR, nl S1, S2 no mrg, 2+ pulses, warm extremities  Respiratory:  CTAB anteriorly, no increased WOB  Abdomen:    Hyperactive BS, soft, mildly distended, nontender.  MSK:   Increased tone and decreased bulk, no LEE, contractures bilateral lower extremities  Neuro:  Diffusely weak, but able to spontaneously move all extremities.    Data Reviewed: Basic Metabolic Panel:  Recent Labs Lab 04/16/14 0139 04/17/14 0240 04/18/14 0455 04/19/14 0340 04/20/14 0344  NA 141 144 146 149* 145  K 4.1 3.9 3.9 4.1 3.3*  CL 101 107 107 113* 106  CO2 25 25 24 20 25   GLUCOSE 87 64* 133* 122* 114*  BUN 22 23 23 21 19   CREATININE 0.89 0.99 0.97 0.91 0.88  CALCIUM 9.4 9.7 10.0 9.7 9.2  MG  --   --   --   --  1.9  PHOS  --   --   --   --  2.6   Liver Function Tests:  Recent Labs Lab 04/15/14 1344 04/19/14 1035  AST 22 29  ALT 16 23  ALKPHOS 91 74  BILITOT 0.5 0.6  PROT 8.0 6.7  ALBUMIN 4.1 3.4*    Recent Labs Lab 04/19/14 1035  LIPASE 68*    Recent Labs Lab 04/18/14 0935  AMMONIA 22   CBC:  Recent Labs Lab 04/15/14 1344  04/16/14 0139 04/17/14 0240 04/18/14 0455 04/19/14 1035 04/20/14 0344  WBC 6.8  --  7.5 6.5 6.9 7.3 9.3  NEUTROABS 4.2  --   --   --   --   --   --   HGB 13.6  < > 10.8* 11.4* 11.5* 11.8* 11.3*  HCT 43.9  < > 34.4* 37.3 36.7 37.9 36.3  MCV 85.4  --  84.5 85.4 84.0 86.5 85.2  PLT 190  --  158 156 165 136* 135*  < > = values in this interval not displayed. Cardiac Enzymes:  Recent Labs Lab 04/16/14 0139 04/16/14 0520  TROPONINI <0.30 <0.30   BNP (last 3 results) No results found for this basename: PROBNP,  in the last 8760 hours CBG:  Recent Labs Lab 04/19/14 1615 04/19/14 2042 04/20/14 0026 04/20/14 0344 04/20/14 0711  GLUCAP 110* 111* 104* 107* 104*    Recent Results (from the past 240 hour(s))  MRSA PCR SCREENING     Status: None   Collection Time    04/15/14  7:14 PM      Result Value Ref Range Status   MRSA by PCR NEGATIVE  NEGATIVE Final   Comment:            The GeneXpert MRSA Assay (FDA     approved for NASAL specimens     only),  is one component of a     comprehensive MRSA colonization  surveillance program. It is not     intended to diagnose MRSA     infection nor to guide or     monitor treatment for     MRSA infections.     Studies: Ct Abdomen Pelvis W Contrast  04/19/2014   CLINICAL DATA:  Left lower quadrant abdominal pain.  EXAM: CT ABDOMEN AND PELVIS WITH CONTRAST  TECHNIQUE: Multidetector CT imaging of the abdomen and pelvis was performed using the standard protocol following bolus administration of intravenous contrast.  CONTRAST:  80mL OMNIPAQUE IOHEXOL 300 MG/ML  SOLN  COMPARISON:  03/13/2011  FINDINGS: The lung bases demonstrate moderate-sized bilateral pleural effusions, right greater than left with overlying atelectasis. The heart is enlarged. No pericardial effusion. There is an NG tube coursing down the esophagus and into the stomach.  The liver is unremarkable. No focal hepatic lesions or intrahepatic biliary dilatation. There is reflux of contrast down the IVC and into the hepatic veins and there may be a component of chronic hepatic congestion. The spleen is normal in size. No focal lesions. The adrenal glands are unremarkable. The kidneys demonstrate scarring changes and cysts.  The stomach, duodenum, small bowel and colon are grossly normal. There is a large amount of stool in the rectum and pneumatosis consistent with fecal impaction. Diffuse colonic diverticulosis without findings for acute diverticulitis. No mesenteric or retroperitoneal mass or adenopathy. The aorta demonstrates advanced atherosclerotic calcifications. The major branch vessels are patent. No focal aneurysm or dissection. Advanced iliac artery calcifications are noted.  The bladder is unremarkable. The uterus and ovaries are grossly normal.  Advanced degenerative changes noted in the spine but no acute bony findings.  IMPRESSION: Markedly distended stool-filled rectum with pneumatosis consistent with fecal impaction.  Colonic  diverticulosis without findings for acute diverticulitis.  Bilateral pleural effusions with overlying atelectasis.  Cardiac enlargement.   Electronically Signed   By: Loralie Champagne M.D.   On: 04/19/2014 15:09   Dg Abd Portable 1v  04/19/2014   CLINICAL DATA:  NG tube confirmation  EXAM: PORTABLE ABDOMEN - 1 VIEW  COMPARISON:  None.  FINDINGS: Enteric tube tip projecting in the region of the stomach. The bowel gas pattern is normal. No radio-opaque calculi or other significant radiographic abnormality are seen. Large amount of stool in the region the rectosigmoid colon.  IMPRESSION: Large amount of fecal retention in the region the rectosigmoid colon. Nonobstructive bowel gas pattern. Enteric tube tip projecting region stomach.   Electronically Signed   By: Salome Holmes M.D.   On: 04/19/2014 11:44    Scheduled Meds: . aspirin  325 mg Per Tube Daily  . cefTRIAXone (ROCEPHIN)  IV  1 g Intravenous Q24H  . citalopram  20 mg Per Tube Daily  . cloNIDine  0.1 mg Transdermal Weekly  . diltiazem  60 mg Per Tube 4 times per day  . docusate  200 mg Per Tube BID  . enalapril  5 mg Per Tube BID  . ferrous sulfate  300 mg Per Tube BID WC  . fluticasone  1 spray Each Nare QHS  . free water  200 mL Per Tube 3 times per day  . furosemide  40 mg Per Tube Daily  . lacosamide  50 mg Per Tube BID  . levothyroxine  200 mcg Per Tube QAC breakfast  . pantoprazole sodium  40 mg Per Tube BID  . polyethylene glycol  17 g Per Tube Daily  . potassium chloride  40 mEq Per Tube Daily  .  vitamin B-12  1,000 mcg Per Tube Daily   Continuous Infusions: . feeding supplement (JEVITY 1.2 CAL) 1,000 mL (04/20/14 0719)    Active Problems:   HYPOTHYROIDISM   Atrial fibrillation   Protein malnutrition   GERD (gastroesophageal reflux disease)   Seizures   Hypertensive urgency   Dementia   Protein-calorie malnutrition, severe    Time spent: 30 min    Shemar Plemmons, [facility]  [facility] Pager [phone]. If  7PM-7AM, please contact night-coverage at [email or web address], password [facility] 04/20/2014, 8:21 AM  LOS: 5 days

## 2014-04-20 NOTE — Progress Notes (Signed)
Speech Language Pathology Treatment: Dysphagia  Patient Details Name: [name] BeringDorothy [name] MRN: [id number] DOB: Nov 06, 1923 Today's Date: 04/20/2014 Time: [id number] SLP Time Calculation (min): 17 min  Assessment / Plan / Recommendation Clinical Impression  Pt became extremely agitated today following brief oral care and refused any POs. Large thick secretions noted during oral care; was able to remove about 75% of these as agitation increased to the point of refusal of further oral care or any PO intake. Even without any PO intake pt presents with wet vocal quality. Given this, pt continues to be at high risk of aspiration. Recommend continuing NPO status and would recommend meds via alternative means when possible; may try crushed with applesauce if necessary. Please continue oral care 4x/ day and prior to providing any meds orally. ST will continue to check daily for PO trials.      HPI HPI: 80 y.o. female with history of atrial fibrillation on Xarelto, hypertension, previous stroke, dementia and hypothyroidism was brought to the emergency room with acute change in mental status at the facility where she resides.  Dx of new onset focal seizure activity with secondary generalization.  CT scan of her head was unremarkable.  Per RD note, pt's diet at SNF changed recently to pureed; since that time she has been eating very well and has gained 2 lbs.     Pertinent Vitals n/a  SLP Plan  Continue with current plan of care    Recommendations Diet recommendations: NPO Medication Administration: Via alternative means              Oral Care Recommendations: Oral care Q4 per protocol Follow up Recommendations: Skilled Nursing facility;24 hour supervision/assistance Plan: Continue with current plan of care    GO     Met[place] KungOleksiak, Amy K, MA, CCC-SLP 04/20/2014, 12:37 PM

## 2014-04-20 NOTE — Plan of Care (Signed)
Problem: SLP Dysphagia Goals Goal: Patient will demonstrate readiness for PO's Patient will demonstrate readiness for PO's and/or instrumental swallow study as evidenced by:  Outcome: Not Progressing Refusal today

## 2014-04-21 DIAGNOSIS — A0472 Enterocolitis due to Clostridium difficile, not specified as recurrent: Secondary | ICD-10-CM

## 2014-04-21 DIAGNOSIS — R197 Diarrhea, unspecified: Secondary | ICD-10-CM

## 2014-04-21 LAB — CBC
HEMATOCRIT: 35.2 % — AB (ref 36.0–46.0)
Hemoglobin: 10.9 g/dL — ABNORMAL LOW (ref 12.0–15.0)
MCH: 27 pg (ref 26.0–34.0)
MCHC: 31 g/dL (ref 30.0–36.0)
MCV: 87.3 fL (ref 78.0–100.0)
Platelets: 122 10*3/uL — ABNORMAL LOW (ref 150–400)
RBC: 4.03 MIL/uL (ref 3.87–5.11)
RDW: 18.6 % — ABNORMAL HIGH (ref 11.5–15.5)
WBC: 4.9 10*3/uL (ref 4.0–10.5)

## 2014-04-21 LAB — BASIC METABOLIC PANEL
Anion gap: 11 (ref 5–15)
BUN: 14 mg/dL (ref 6–23)
CHLORIDE: 108 meq/L (ref 96–112)
CO2: 28 meq/L (ref 19–32)
Calcium: 8.8 mg/dL (ref 8.4–10.5)
Creatinine, Ser: 0.76 mg/dL (ref 0.50–1.10)
GFR calc non Af Amer: 74 mL/min — ABNORMAL LOW (ref >90)
GFR, EST AFRICAN AMERICAN: 85 mL/min — AB (ref >90)
GLUCOSE: 89 mg/dL (ref 70–99)
Potassium: 3.2 mEq/L — ABNORMAL LOW (ref 3.7–5.3)
Sodium: 147 mEq/L (ref 137–147)

## 2014-04-21 LAB — GLUCOSE, CAPILLARY
GLUCOSE-CAPILLARY: 132 mg/dL — AB (ref 70–99)
GLUCOSE-CAPILLARY: 111 mg/dL — AB (ref 70–99)
Glucose-Capillary: 109 mg/dL — ABNORMAL HIGH (ref 70–99)
Glucose-Capillary: 118 mg/dL — ABNORMAL HIGH (ref 70–99)
Glucose-Capillary: 118 mg/dL — ABNORMAL HIGH (ref 70–99)
Glucose-Capillary: 89 mg/dL (ref 70–99)

## 2014-04-21 LAB — MAGNESIUM: MAGNESIUM: 2 mg/dL (ref 1.5–2.5)

## 2014-04-21 LAB — PHOSPHORUS: Phosphorus: 2.7 mg/dL (ref 2.3–4.6)

## 2014-04-21 MED ORDER — FERROUS SULFATE 325 (65 FE) MG PO TABS
325.0000 mg | ORAL_TABLET | Freq: Every day | ORAL | Status: DC
  Administered 2014-04-22 – 2014-04-23 (×2): 325 mg via ORAL
  Filled 2014-04-21 (×5): qty 1

## 2014-04-21 MED ORDER — CITALOPRAM HYDROBROMIDE 20 MG PO TABS
20.0000 mg | ORAL_TABLET | Freq: Every day | ORAL | Status: DC
Start: 2014-04-21 — End: 2014-04-23
  Administered 2014-04-22 – 2014-04-23 (×2): 20 mg via ORAL
  Filled 2014-04-21 (×3): qty 1

## 2014-04-21 MED ORDER — POTASSIUM CHLORIDE 20 MEQ/15ML (10%) PO LIQD
60.0000 meq | Freq: Every day | ORAL | Status: DC
  Administered 2014-04-21: 60 meq
  Filled 2014-04-21: qty 45

## 2014-04-21 MED ORDER — LEVOTHYROXINE SODIUM 200 MCG PO TABS
200.0000 ug | ORAL_TABLET | Freq: Every day | ORAL | Status: DC
  Administered 2014-04-22 – 2014-04-23 (×2): 200 ug via ORAL
  Filled 2014-04-21 (×4): qty 1

## 2014-04-21 MED ORDER — LACOSAMIDE 50 MG PO TABS
50.0000 mg | ORAL_TABLET | Freq: Two times a day (BID) | ORAL | Status: DC
  Administered 2014-04-21 – 2014-04-23 (×4): 50 mg via ORAL
  Filled 2014-04-21 (×5): qty 1

## 2014-04-21 MED ORDER — VITAMIN B-12 1000 MCG PO TABS
1000.0000 ug | ORAL_TABLET | Freq: Every day | ORAL | Status: DC
  Filled 2014-04-21 (×3): qty 1

## 2014-04-21 MED ORDER — POTASSIUM CHLORIDE CRYS ER 20 MEQ PO TBCR
60.0000 meq | EXTENDED_RELEASE_TABLET | Freq: Every day | ORAL | Status: DC
  Administered 2014-04-22: 60 meq via ORAL
  Administered 2014-04-23: 20 meq via ORAL
  Filled 2014-04-21 (×2): qty 3

## 2014-04-21 MED ORDER — LISINOPRIL 5 MG PO TABS
5.0000 mg | ORAL_TABLET | Freq: Every day | ORAL | Status: DC
  Administered 2014-04-22 – 2014-04-23 (×2): 5 mg via ORAL
  Filled 2014-04-21 (×3): qty 1

## 2014-04-21 MED ORDER — PANTOPRAZOLE SODIUM 40 MG PO TBEC
40.0000 mg | DELAYED_RELEASE_TABLET | Freq: Two times a day (BID) | ORAL | Status: DC
  Administered 2014-04-22 – 2014-04-23 (×3): 40 mg via ORAL
  Filled 2014-04-21 (×4): qty 1

## 2014-04-21 MED ORDER — METRONIDAZOLE 500 MG PO TABS
500.0000 mg | ORAL_TABLET | Freq: Three times a day (TID) | ORAL | Status: DC
  Administered 2014-04-21 – 2014-04-23 (×6): 500 mg via ORAL
  Filled 2014-04-21 (×9): qty 1

## 2014-04-21 MED ORDER — ASPIRIN 325 MG PO TABS
325.0000 mg | ORAL_TABLET | Freq: Every day | ORAL | Status: DC
  Administered 2014-04-22 – 2014-04-23 (×2): 325 mg via ORAL
  Filled 2014-04-21 (×3): qty 1

## 2014-04-21 MED ORDER — DILTIAZEM HCL 60 MG PO TABS
60.0000 mg | ORAL_TABLET | Freq: Four times a day (QID) | ORAL | Status: DC
  Administered 2014-04-21 – 2014-04-23 (×6): 60 mg via ORAL
  Filled 2014-04-21 (×14): qty 1

## 2014-04-21 MED ORDER — FREE WATER
200.0000 mL | Freq: Four times a day (QID) | Status: DC
  Administered 2014-04-21: 200 mL

## 2014-04-21 NOTE — Progress Notes (Addendum)
[facility] PROGRESS NOTE  [name] [name] ZOX:[id number] DOB: March 24, 1924 DOA: 04/15/2014 PCP: Oneal Grout, MD  Assessment/Plan  Seizures, New onset.  May have been triggered by underlying infection, but no previous seizure history.  Changed from keppra to vimpat 7/2 by neurology due to persistent confusion, babbling speech.  Having more coherent speech daily.   -  Appreciate neurology assistance -  Head CT neg -  EEG with slowing but without epileptiform activity -  MRI demonstrates no acute finding. She has chronic small vessel ischemic changes an old small infarctions affecting the left cerebellum and left thalamus -  Ammonia level 22 -  Continue vimpat  C. Diff colitis, she developed diarrhea immediately after her impaction was cleared.  Hx of C. Diff colitis last year.    -  Flagyl day 2  -  Enteric precautions  Stool impaction with LLQ abdominal pain -  LFTs, lipase, lactic acid, and repeat UA/UCx:  Lipase mildly elevated but nonspecific -  CT abd/pelvis:  Stool impaction -  Constipation resolved with disimpaction, colace, miralax  UTI, s/p 5 days of ceftriaxone  Atrial fibrillation with RVR, resolved.  Currently rate controlled. -  Continue dilt  -  Resume xarelto if able to resume PO -  Continue daily aspirin  Acute on chronic diastolic heart failure  -  Hold lasix in setting of frequent diarrhea.  Malignant HTN, BP now wnl  -  Continue dilt and enalapril -  Continue prn hydralazine  HYPOTHYROIDISM, TSH likely to be abnl in setting of acute illness  - continue synthroid - repeat TFTs in 2-4 weeks  GERD (gastroesophageal reflux disease), stable, continue PPI  Severe protein calorie malnutrition in setting of severe dysphagia -  SLP to reevaluate daily -  Appreciate nutrition assistance -  Continue Panda for a few days to see if mentation recovers:  Okay to increase tube feed rate -  Palliative care consult today  Hypernatremia due to free water  deficit.  Sodium trending back up, probably from excessive diarrhea -  Increase FW flushes to q6h today and will reduce tomorrow if sodium nearer 140. -  Repeat BMP in AM  Hypokalemia, likely secondary to inadequate tube feed rate and diarrhea -  Increase potassium to daily  Hypoglycemia due to poor oral intake and low reserves -  Continue tube feeds -  Continue CBG  Depression/dementia, stable, continue celexa   Iron and B12 deficiency anemia, Hemoglobin stable.   -  Continue iron and B12 supplements   Diet:  NPO with tube feeds Access:  PIV IVF:  yes Proph:  SCDs  Code Status: DNR Family Communication: patient and daughter Disposition Plan:  Continue daily speech evals - I am hopeful that she will be able to start eating again soon with dysphagia 1 diet given improvement in mentation.  Palliative care to meet with family today.    Consultants:  Neurology  Palliative care  Procedures:  CT head  EEG  MRI brain  Panda 7/4  CT abd/pelvis 7/4  KUB 7/4  Antibiotics:  Cipro 6/30 >> 7/1  Ceftriaxone 7/1 >> 7/5  HPI/Subjective:  States she feels better.  She perseverates on breathing.  Unable to consistently answer questions, but more articulate and does not seem to be in discomfort  Objective: Filed Vitals:   04/20/14 0758 04/20/14 1500 04/20/14 2000 04/21/14 0350  BP: 128/82 131/88 127/81 133/80  Pulse: 87 88 73 73  Temp:  97.5 F (36.4 C) 97.7 F (36.5 C)  97.4 F (36.3 C)  TempSrc:  Oral Oral Oral  Resp:  16 18 18   Height:      Weight:    34.6 kg (76 lb 4.5 oz)  SpO2:  93% 98% 98%    Intake/Output Summary (Last 24 hours) at 04/21/14 0732 Last data filed at 04/20/14 2300  Gross per 24 hour  Intake    345 ml  Output      0 ml  Net    345 ml   Filed Weights   04/19/14 0426 04/20/14 0337 04/21/14 0350  Weight: 44.8 kg (98 lb 12.3 oz) 44.8 kg (98 lb 12.3 oz) 34.6 kg (76 lb 4.5 oz)    Exam:   General:  Cachectic CF, No acute  distress, awake.  When asked how she is doing, she responds "better."   HEENT:  NCAT, MMM  Cardiovascular:  IRRR, nl S1, S2 no mrg, 2+ pulses, warm extremities  Respiratory:  CTAB anteriorly, no increased WOB  Abdomen:   Hyperactive BS, soft, mildly distended, nontender.  MSK:   Increased tone and decreased bulk, no LEE, contractures bilateral lower extremities  Neuro:  Diffusely weak, but able to spontaneously move all extremities.    Data Reviewed: Basic Metabolic Panel:  Recent Labs Lab 04/17/14 0240 04/18/14 0455 04/19/14 0340 04/20/14 0344 04/21/14 0350  NA 144 146 149* 145 147  K 3.9 3.9 4.1 3.3* 3.2*  CL 107 107 113* 106 108  CO2 25 24 20 25 28   GLUCOSE 64* 133* 122* 114* 89  BUN 23 23 21 19 14   CREATININE 0.99 0.97 0.91 0.88 0.76  CALCIUM 9.7 10.0 9.7 9.2 8.8  MG  --   --   --  1.9 2.0  PHOS  --   --   --  2.6 2.7   Liver Function Tests:  Recent Labs Lab 04/15/14 1344 04/19/14 1035  AST 22 29  ALT 16 23  ALKPHOS 91 74  BILITOT 0.5 0.6  PROT 8.0 6.7  ALBUMIN 4.1 3.4*    Recent Labs Lab 04/19/14 1035  LIPASE 68*    Recent Labs Lab 04/18/14 0935  AMMONIA 22   CBC:  Recent Labs Lab 04/15/14 1344  04/17/14 0240 04/18/14 0455 04/19/14 1035 04/20/14 0344 04/21/14 0350  WBC 6.8  < > 6.5 6.9 7.3 9.3 4.9  NEUTROABS 4.2  --   --   --   --   --   --   HGB 13.6  < > 11.4* 11.5* 11.8* 11.3* 10.9*  HCT 43.9  < > 37.3 36.7 37.9 36.3 35.2*  MCV 85.4  < > 85.4 84.0 86.5 85.2 87.3  PLT 190  < > 156 165 136* 135* 122*  < > = values in this interval not displayed. Cardiac Enzymes:  Recent Labs Lab 04/16/14 0139 04/16/14 0520  TROPONINI <0.30 <0.30   BNP (last 3 results) No results found for this basename: PROBNP,  in the last 8760 hours CBG:  Recent Labs Lab 04/20/14 1115 04/20/14 1623 04/20/14 1959 04/20/14 2335 04/21/14 0349  GLUCAP 121* 107* 109* 118* 89    Recent Results (from the past 240 hour(s))  MRSA PCR SCREENING      Status: None   Collection Time    04/15/14  7:14 PM      Result Value Ref Range Status   MRSA by PCR NEGATIVE  NEGATIVE Final   Comment:            The GeneXpert MRSA Assay (FDA  approved for NASAL specimens     only), is one component of a     comprehensive MRSA colonization     surveillance program. It is not     intended to diagnose MRSA     infection nor to guide or     monitor treatment for     MRSA infections.  URINE CULTURE     Status: None   Collection Time    04/19/14  9:09 AM      Result Value Ref Range Status   Specimen Description URINE, CATHETERIZED   Final   Special Requests NONE   Final   Culture  Setup Time     Final   Value: 04/19/2014 11:55     Performed at [facility]   Colony Count     Final   Value: NO GROWTH     Performed at [facility]   Culture     Final   Value: NO GROWTH     Performed at [facility]   Report Status 04/20/2014 FINAL   Final  CLOSTRIDIUM DIFFICILE BY PCR     Status: Abnormal   Collection Time    04/19/14 11:37 PM      Result Value Ref Range Status   C difficile by pcr POSITIVE (*) NEGATIVE Final   Comment: CRITICAL RESULT CALLED TO, READ BACK BY AND VERIFIED WITH:     STEIN,O RN 04/20/14 1020 WOOTEN,K     Studies: Ct Abdomen Pelvis W Contrast  04/19/2014   CLINICAL DATA:  Left lower quadrant abdominal pain.  EXAM: CT ABDOMEN AND PELVIS WITH CONTRAST  TECHNIQUE: Multidetector CT imaging of the abdomen and pelvis was performed using the standard protocol following bolus administration of intravenous contrast.  CONTRAST:  80mL OMNIPAQUE IOHEXOL 300 MG/ML  SOLN  COMPARISON:  03/13/2011  FINDINGS: The lung bases demonstrate moderate-sized bilateral pleural effusions, right greater than left with overlying atelectasis. The heart is enlarged. No pericardial effusion. There is an NG tube coursing down the esophagus and into the stomach.  The liver is unremarkable. No focal hepatic lesions or intrahepatic biliary  dilatation. There is reflux of contrast down the IVC and into the hepatic veins and there may be a component of chronic hepatic congestion. The spleen is normal in size. No focal lesions. The adrenal glands are unremarkable. The kidneys demonstrate scarring changes and cysts.  The stomach, duodenum, small bowel and colon are grossly normal. There is a large amount of stool in the rectum and pneumatosis consistent with fecal impaction. Diffuse colonic diverticulosis without findings for acute diverticulitis. No mesenteric or retroperitoneal mass or adenopathy. The aorta demonstrates advanced atherosclerotic calcifications. The major branch vessels are patent. No focal aneurysm or dissection. Advanced iliac artery calcifications are noted.  The bladder is unremarkable. The uterus and ovaries are grossly normal.  Advanced degenerative changes noted in the spine but no acute bony findings.  IMPRESSION: Markedly distended stool-filled rectum with pneumatosis consistent with fecal impaction.  Colonic diverticulosis without findings for acute diverticulitis.  Bilateral pleural effusions with overlying atelectasis.  Cardiac enlargement.   Electronically Signed   By: Loralie Champagne M.D.   On: 04/19/2014 15:09   Dg Abd Portable 1v  04/19/2014   CLINICAL DATA:  NG tube confirmation  EXAM: PORTABLE ABDOMEN - 1 VIEW  COMPARISON:  None.  FINDINGS: Enteric tube tip projecting in the region of the stomach. The bowel gas pattern is normal. No radio-opaque calculi or other significant radiographic abnormality are  seen. Large amount of stool in the region the rectosigmoid colon.  IMPRESSION: Large amount of fecal retention in the region the rectosigmoid colon. Nonobstructive bowel gas pattern. Enteric tube tip projecting region stomach.   Electronically Signed   By: Salome HolmesHector  Cooper M.D.   On: 04/19/2014 11:44    Scheduled Meds: . aspirin  325 mg Per Tube Daily  . citalopram  20 mg Per Tube Daily  . diltiazem  60 mg Per Tube 4  times per day  . enalapril  5 mg Per Tube BID  . ferrous sulfate  300 mg Per Tube BID WC  . fluticasone  1 spray Each Nare QHS  . free water  200 mL Per Tube 3 times per day  . furosemide  40 mg Per Tube Daily  . lacosamide  50 mg Per Tube BID  . levothyroxine  200 mcg Per Tube QAC breakfast  . metroNIDAZOLE  500 mg Per Tube 3 times per day  . pantoprazole sodium  40 mg Per Tube BID  . potassium chloride  60 mEq Per Tube Daily  . saccharomyces boulardii  250 mg Oral BID  . vitamin B-12  1,000 mcg Per Tube Daily   Continuous Infusions: . feeding supplement (JEVITY 1.2 CAL) 1,000 mL (04/20/14 1729)    Active Problems:   HYPOTHYROIDISM   Atrial fibrillation   Protein malnutrition   GERD (gastroesophageal reflux disease)   Seizures   Hypertensive urgency   Dementia   Protein-calorie malnutrition, severe   Impacted stool in rectum   Diarrhea    Time spent: 30 min    Molly Savarino, [facility]  [facility] Pager [phone]. If 7PM-7AM, please contact night-coverage at [email or web address], password [email or web address] 04/21/2014, 7:32 AM  LOS: 6 days

## 2014-04-21 NOTE — Progress Notes (Signed)
Palliative Medicine Team  Patient cleared for a diet now. Her daughter reports that she is doing much better and will not be back in town until Wednesday unless needed. Patient's son [name] will be checking in on her in afternoon. At this time she feels their goals are established and plan is for [facility]. If formal PMT consultation is still needed or desired by family please re-consult [phone].  Anderson MaltaElizabeth Donise Woodle, DO Palliative Medicine

## 2014-04-21 NOTE — Progress Notes (Signed)
Weighed pt this morning with a bed weight of 34.6 kg without mattress overlay machine. On previous weights the mattress overlay machine has not been removed from the bed for the weight. The machine weighs about 10kg. Therefore there appears to be a 10 kg drop in weight from yesterday til today.  Though the difference in weight is just the machine. The patient should be weighed on the bed without the mattress overlay machine to get an accurate weight.

## 2014-04-21 NOTE — Progress Notes (Signed)
CSW continues to follow patient for [facility] when medically ready.  Maree KrabbeLindsay Idamae Coccia, MSW, Theresia MajorsLCSWA [phone]

## 2014-04-21 NOTE — Progress Notes (Signed)
Speech Language Pathology Treatment: Dysphagia  Patient Details Name: [name] BeringDorothy Fowles MRN: [id number] DOB: September 11, 1924 Today's Date: 04/21/2014 Time: [id number] SLP Time Calculation (min): 17 min  Assessment / Plan / Recommendation Clinical Impression  Pt demonstrates improved mentation today, requesting water and orally accepting POs with moderate verbal and tactile cues. Pt manipulated a straw much more easily that an a cup, but was noted to have a slightly delayed swallow response and intermittent coughing with liquids. She accepted small bites of ice cream with moderate tactile cues with total assist feeding. Pt remains confused and slowly responsive with language of confusion and verbalizing visual hallucinations. Recommend modified diet of puree (dys 1) and nectar thick liquids to reduce risk of aspiration. SLP will f/u for diet advancement. Pt/family to have Goals of care meeting today - are they ok with diet modification or should pt be on diet for full comfort?     HPI HPI: 80 y.o. female with history of atrial fibrillation on Xarelto, hypertension, previous stroke, dementia and hypothyroidism was brought to the emergency room with acute change in mental status at the facility where she resides.  Dx of new onset focal seizure activity with secondary generalization.  CT scan of her head was unremarkable.  Per RD note, pt's diet at SNF changed recently to pureed; since that time she has been eating very well and has gained 2 lbs.     Pertinent Vitals NA  SLP Plan  Continue with current plan of care    Recommendations Diet recommendations: Dysphagia 1 (puree);Nectar-thick liquid Liquids provided via: Straw Medication Administration: Crushed with puree Supervision: Full supervision/cueing for compensatory strategies Compensations: Slow rate;Small sips/bites Postural Changes and/or Swallow Maneuvers: Seated upright 90 degrees              Oral Care Recommendations: Oral care BID Follow up  Recommendations: Skilled Nursing facility;24 hour supervision/assistance Plan: Continue with current plan of care    GO    [facility] CampusBonnie Amily Depp, MA CCC-SLP [phone]  Claudine MoutonDeBlois, Jazzalyn Loewenstein Caroline 04/21/2014, 10:54 AM

## 2014-04-22 DIAGNOSIS — E870 Hyperosmolality and hypernatremia: Secondary | ICD-10-CM

## 2014-04-22 LAB — CBC
HCT: 35.9 % — ABNORMAL LOW (ref 36.0–46.0)
HEMOGLOBIN: 11 g/dL — AB (ref 12.0–15.0)
MCH: 26.4 pg (ref 26.0–34.0)
MCHC: 30.6 g/dL (ref 30.0–36.0)
MCV: 86.1 fL (ref 78.0–100.0)
PLATELETS: 157 10*3/uL (ref 150–400)
RBC: 4.17 MIL/uL (ref 3.87–5.11)
RDW: 18.5 % — ABNORMAL HIGH (ref 11.5–15.5)
WBC: 6.9 10*3/uL (ref 4.0–10.5)

## 2014-04-22 LAB — PHOSPHORUS: PHOSPHORUS: 2.7 mg/dL (ref 2.3–4.6)

## 2014-04-22 LAB — BASIC METABOLIC PANEL
ANION GAP: 13 (ref 5–15)
BUN: 16 mg/dL (ref 6–23)
CHLORIDE: 106 meq/L (ref 96–112)
CO2: 27 mEq/L (ref 19–32)
CREATININE: 0.76 mg/dL (ref 0.50–1.10)
Calcium: 9.3 mg/dL (ref 8.4–10.5)
GFR calc non Af Amer: 74 mL/min — ABNORMAL LOW (ref >90)
GFR, EST AFRICAN AMERICAN: 85 mL/min — AB (ref >90)
Glucose, Bld: 114 mg/dL — ABNORMAL HIGH (ref 70–99)
POTASSIUM: 4.3 meq/L (ref 3.7–5.3)
Sodium: 146 mEq/L (ref 137–147)

## 2014-04-22 LAB — GLUCOSE, CAPILLARY
GLUCOSE-CAPILLARY: 115 mg/dL — AB (ref 70–99)
GLUCOSE-CAPILLARY: 121 mg/dL — AB (ref 70–99)
Glucose-Capillary: 84 mg/dL (ref 70–99)
Glucose-Capillary: 98 mg/dL (ref 70–99)

## 2014-04-22 LAB — MAGNESIUM: Magnesium: 2.2 mg/dL (ref 1.5–2.5)

## 2014-04-22 MED ORDER — METRONIDAZOLE 500 MG PO TABS: 500.0000 mg | ORAL_TABLET | Freq: Three times a day (TID) | ORAL | Status: DC

## 2014-04-22 MED ORDER — LACOSAMIDE 50 MG PO TABS: 50.0000 mg | ORAL_TABLET | Freq: Two times a day (BID) | ORAL | Status: DC

## 2014-04-22 MED ORDER — RESOURCE THICKENUP CLEAR PO POWD
ORAL | Status: DC | PRN
  Filled 2014-04-22: qty 125

## 2014-04-22 MED ORDER — DILTIAZEM HCL 60 MG PO TABS: 60.0000 mg | ORAL_TABLET | Freq: Four times a day (QID) | ORAL | Status: DC

## 2014-04-22 MED ORDER — ASPIRIN 81 MG PO TABS: 81.0000 mg | ORAL_TABLET | Freq: Every day | ORAL | Status: DC

## 2014-04-22 NOTE — Progress Notes (Addendum)
Update: CSW spoke to patient's daughter over the phone to discuss possible dc tomorrow back to [facility]. Daughter is agreeable to plan and would like social worker to update daughter tomorrow.  Per RN, patient's daughter would like to speak with Child psychotherapistsocial worker. CSW attempted to call patient's daughter. Social Worker Data processing managerleft voicemail and is awaiting call back.

## 2014-04-22 NOTE — Discharge Summary (Addendum)
Physician Discharge Summary  Karsen Fellows ZOX:[id number] DOB: 09-Jul-1924 DOA: 04/15/2014  PCP: Oneal Grout, MD  Admit date: 04/15/2014 Discharge date: 04/23/2014  Recommendations for Outpatient Follow-up:  1. Transfer back to [facility].  Continue speech therapy assessments and practice for meals 2. Please continue to encourage her to eat frequently and assist with meals, particularly while she is still recovering from this illness.  See below for speech therapy recommendations.   3. Air mattress if available and frequent turning  4. Start vimpat for seizures 5. Continue metronidazole through 7/19 and may stop on 7/20 if diarrhea resolved.  If diarrhea persists, continue for an additional week.   6. If she regains ability to tolerate whole pills, consider stopping aspirin and restarting xarelto 7. Repeat TFTs in 2-4 weeks. 8. DNR 9. Daily weights.  If she gains more than 3-lbs in 1 day or 5-lbs in 1 week, or if she develops worsening SOB or swelling of the legs, please notify supervising MD.    Discharge Diagnoses:  Principal Problem:   Seizures Active Problems:   Enteritis due to Clostridium difficile   HYPOTHYROIDISM   Atrial fibrillation   Protein malnutrition   UTI (lower urinary tract infection)   GERD (gastroesophageal reflux disease)   Hypertensive urgency   Dementia   Protein-calorie malnutrition, severe   Impacted stool in rectum   Hypernatremia   Discharge Condition: stable, improved  Diet recommendation:   Dysphagia 1 (puree);Nectar-thick liquid  Liquids provided via: Straw  Medication Administration: Crushed with puree  Supervision: Full supervision/cueing for compensatory strategies  Compensations: Slow rate;Small sips/bites  Postural Changes and/or Swallow Maneuvers: Seated upright 90 degrees    Wt Readings from Last 3 Encounters:  04/22/14 44.3 kg (97 lb 10.6 oz)  06/11/13 46.176 kg (101 lb 12.8 oz)  05/20/13 46.993 kg (103 lb 9.6 oz)    History  of present illness:  [name] [name] is a 80 y.o. female SNF resident with multiple medical problems as listed below including CVA/TIA, Afib on xarelto, HTN, Dementia, Hypothyroidism who presents with the above complaints. Per family pt was found slumped to right in her wheelchair at SNF, and son relates that she had jerking activity. When EMS arrived they reported that pt's eyes were gazing to the L. and she was unresponsive. Code stroke was called prior to arrival. Upon arrival she was found to be markedly elevated BPS, and tachy up to 14Os per EDP and she was given IV labetalol. Also while in ED she had a grandmal seizure>> she was given IV ativan and then loaded with IV keppra per Neuro, and pt has not had further seizures. At the time of my exam, pt is postictal. Head CT neg for acute findings, severe chronic involutional change noted.   Hospital Course:   Seizures, New onset. May have been triggered by underlying infection, but no previous seizure history.  Head CT neg. MRI demonstrates no acute finding. She has chronic small vessel ischemic changes an old small infarctions affecting the left cerebellum and left thalamus.  EEG performed after initiation of AEDs demonstrated slowing without epileptiform activity.  She was found to have a urinary tract infection and C. Diff colitis.  Ammonia level was wnl.  Initially, she was started on keppra by Neurology, however, she remained lethargic for several days and was unable to eat and drink.  She was changed to vimpat and finally recovered her alertness and ability to follow instructions.  She had some expressive aphasia which gradually has improved but  has not fully resolved.    UTI/cystitis.  She completed 5 days of ceftriaxone  C. Diff colitis, started on flagyl on 7/5.  Please continue flagyl through 7/19 and if diarrhea has stopped, may stop flagyl and monitor for diarrhea.  If diarrhea continues, continue flagyl for an additional week and reassess.     Severe protein calorie malnutrition in setting of severe dysphagia:  She was evaluated daily by speech therapy and initially was not awake enough to eat.  She had a panda placed for tube feeds for a few days and her mentation improved.  She was cleared for a dysphagia 1 diet with nectar thick liquids which she should continue for now.  Please have speech therapy reassess after she returns to SNF.    Stool impaction with LLQ abdominal pain.  She developed abdominal pain (before she developed diarrhea.  It was reported that she had been having bowel movements.  Because her LFTs, lipase, lactic acid only demonstrated a mildly abnl lipase, she underwent CT scan which showed impaction without colitis.  She underwent manual disimpaction and afterwards started having diarrhea which was positive for C. Diff.  Once her diarrhea starts to slow, she may need to restart her stool softeners to prevent further impactions.  Atrial fibrillation with RVR, resolved. Currently rate controlled.  Initially tachycardic to the 120s and was started on IV metoprolol and rectal aspirin.  After she had panda placed, she was transitioned back to diltiazem, ahtlugh this was changed to non-extended release which can be crushed in purees.  She may NOT resume her xarelto which cannot be crushed and should continue daily aspirin for now.  If her eating improves and she is able to tolerate whole pills, consider restarting xarelto.  Consider transitioning to coumadin if her dysphagia does not improve.    Acute on chronic diastolic heart failure, hold lasix in setting of frequent diarrhea and poor oral intake.  Malignant HTN, BP initially high, however, it has been wnl on dilt + ACEI.  HYPOTHYROIDISM, TSH likely to be abnl in setting of acute illness.  Continue synthroid.  Repeat TFTs in 2-4 weeks   GERD (gastroesophageal reflux disease), stable, hold PPI due to C. Diff colitis.  Hypernatremia due to free water deficit. She received  free water flushes while tube feeds were running and this normalized.  Encourage patient hand-feeding.  Repeat BMP in 1 week.  Hypokalemia, likely secondary to inadequate tube feed rate and diarrhea.  Oral supplements.  Hypoglycemia due to poor oral intake and low reserves, resolved.   Depression/dementia, stable, continue celexa  Iron and B12 deficiency anemia, Hemoglobin stable.  Continue iron and B12 supplements  Consultants:  Neurology  Palliative care Procedures:  CT head  EEG  MRI brain  Panda 7/4  CT abd/pelvis 7/4  KUB 7/4 Antibiotics:  Cipro 6/30 >> 7/1  Ceftriaxone 7/1 >> 7/5 Flagyl 7/5 >>  Discharge Exam: Filed Vitals:   04/22/14 0429  BP: 128/78  Pulse: 78  Temp: 98.2 F (36.8 C)  Resp: 16   Filed Vitals:   04/21/14 1018 04/21/14 1445 04/21/14 2029 04/22/14 0429  BP: 154/94 138/94 123/76 128/78  Pulse: 91 84 87 78  Temp:  97.4 F (36.3 C)  98.2 F (36.8 C)  TempSrc:  Oral  Oral  Resp:  20 17 16   Height:      Weight:    44.3 kg (97 lb 10.6 oz)  SpO2:  97% 97% 99%   General: Cachectic CF, No acute  distress, awake. When asked how she is doing, she responds "better."  HEENT: NCAT, MMM  Cardiovascular: IRRR, nl S1, S2 no mrg, 2+ pulses, warm extremities  Respiratory: CTAB anteriorly, no increased WOB  Abdomen: Hyperactive BS, soft, mildly distended, nontender.  MSK: Increased tone and decreased bulk, no LEE, contractures bilateral lower extremities  Neuro: Diffusely weak, but able to spontaneously move all extremities.    Discharge Instructions     Medication List    ASK your doctor about these medications       acetaminophen 500 MG tablet  Commonly known as:  TYLENOL  Take 500 mg by mouth 2 (two) times daily.     bisacodyl 10 MG suppository  Commonly known as:  DULCOLAX  Place 10 mg rectally See admin instructions. Every 3 days as needed for no bowl movement     citalopram 20 MG tablet  Commonly known as:  CELEXA  Take 20 mg by mouth  daily.     diltiazem 120 MG 24 hr capsule  Commonly known as:  DILACOR XR  Take 120 mg by mouth daily.     fluticasone 50 MCG/ACT nasal spray  Commonly known as:  FLONASE  Place 1 spray into the nose at bedtime.     furosemide 40 MG tablet  Commonly known as:  LASIX  Take 40 mg by mouth 2 (two) times daily.     HYDROcodone-acetaminophen 5-325 MG per tablet  Commonly known as:  NORCO/VICODIN  Take two tablets by mouth every morning. Take one tablet by mouth every 8 hours as needed for OA/Pain     hydroxypropyl methylcellulose 2.5 % ophthalmic solution  Commonly known as:  ISOPTO TEARS  Place 1 drop into both eyes 4 (four) times daily as needed.     ipratropium-albuterol 0.5-2.5 (3) MG/3ML Soln  Commonly known as:  DUONEB  Take 3 mLs by nebulization every 6 (six) hours as needed (for congestion).     iron polysaccharides 150 MG capsule  Commonly known as:  NIFEREX  Take 150 mg by mouth 2 (two) times daily.     levothyroxine 200 MCG tablet  Commonly known as:  SYNTHROID, LEVOTHROID  Take 200 mcg by mouth daily before breakfast.     omeprazole 40 MG capsule  Commonly known as:  PRILOSEC  Take 40 mg by mouth daily.     PEG 3350 Powd  Take 17 g by mouth daily.     potassium chloride SA 20 MEQ tablet  Commonly known as:  K-DUR,KLOR-CON  Take 20 mEq by mouth daily.     PRESERVISION AREDS 2 PO  Take 1 capsule by mouth 2 (two) times daily.     PROCEL Powd  Take 1 scoop by mouth 3 (three) times daily.     Rivaroxaban 15 MG Tabs tablet  Commonly known as:  XARELTO  Take 15 mg by mouth daily.     sennosides-docusate sodium 8.6-50 MG tablet  Commonly known as:  SENOKOT-S  Take 1 tablet by mouth 2 (two) times daily.     vitamin B-12 1000 MCG tablet  Commonly known as:  CYANOCOBALAMIN  Take 1,000 mcg by mouth daily.       Follow-up Information   Follow up with Oneal GroutPANDEY, MAHIMA, MD.   Specialty:  Internal Medicine   Contact information:   [address]  [place] [place] [id number] [phone]        The results of significant diagnostics from this hospitalization (including imaging, microbiology, ancillary and laboratory) are  listed below for reference.    Significant Diagnostic Studies: Ct Head Wo Contrast  04/15/2014   CLINICAL DATA:  Code stroke, posturing, left gaze  EXAM: CT HEAD WITHOUT CONTRAST  TECHNIQUE: Contiguous axial images were obtained from the base of the skull through the vertex without intravenous contrast.  COMPARISON:  02/15/2012  FINDINGS: Severe diffuse cortical atrophy and severe low-attenuation diffusely in the deep white matter. Sub cm lacunar infarct left thalamus. Sub cm lacunar infarcts adjacent to 1 another in the inferior left cerebellar hemisphere, stable. Sub cm lacunar infarct deep right periventricular white matter, stable. No evidence of vascular territory infarct. No hemorrhage or extra axial fluid. No hydrocephalus or evidence of mass. Calvarium is intact.  IMPRESSION: Severe chronic involutional change with no acute findings. Critical Value/emergent results were called by telephone at the time of interpretation on 04/15/2014 at 1:58 PM to Dr. Roseanne Reno , who verbally acknowledged these results.   Electronically Signed   By: Esperanza Heir M.D.   On: 04/15/2014 13:58   Mr Brain Wo Contrast  04/16/2014   CLINICAL DATA:  Chronic vascular disease and dementia. Mental status changes.  EXAM: MRI HEAD WITHOUT CONTRAST  TECHNIQUE: Multiplanar, multiecho pulse sequences of the brain and surrounding structures were obtained without intravenous contrast.  COMPARISON:  Head CT 04/15/2014.  FINDINGS: Diffusion imaging does not show any acute or subacute infarction. There chronic small-vessel changes affecting the pons. There are old small vessel cerebellar infarctions on the left. There are chronic small-vessel changes of the thalami I with left thalamic old lacunar infarction. There are chronic small-vessel ischemic changes  throughout the cerebral hemispheric white matter with hemispheric atrophy. No large vessel territory infarction. No mass lesion, hemorrhage, hydrocephalus or extra-axial collection. No pituitary mass. No inflammatory sinus disease. No skull or skullbase lesion.  IMPRESSION: No acute or reversible finding. Chronic small vessel ischemic changes throughout the brain. Old small vessel infarctions affecting the left cerebellum and the left thalamus.   Electronically Signed   By: Paulina Fusi M.D.   On: 04/16/2014 10:10   Ct Abdomen Pelvis W Contrast  04/19/2014   CLINICAL DATA:  Left lower quadrant abdominal pain.  EXAM: CT ABDOMEN AND PELVIS WITH CONTRAST  TECHNIQUE: Multidetector CT imaging of the abdomen and pelvis was performed using the standard protocol following bolus administration of intravenous contrast.  CONTRAST:  80mL OMNIPAQUE IOHEXOL 300 MG/ML  SOLN  COMPARISON:  03/13/2011  FINDINGS: The lung bases demonstrate moderate-sized bilateral pleural effusions, right greater than left with overlying atelectasis. The heart is enlarged. No pericardial effusion. There is an NG tube coursing down the esophagus and into the stomach.  The liver is unremarkable. No focal hepatic lesions or intrahepatic biliary dilatation. There is reflux of contrast down the IVC and into the hepatic veins and there may be a component of chronic hepatic congestion. The spleen is normal in size. No focal lesions. The adrenal glands are unremarkable. The kidneys demonstrate scarring changes and cysts.  The stomach, duodenum, small bowel and colon are grossly normal. There is a large amount of stool in the rectum and pneumatosis consistent with fecal impaction. Diffuse colonic diverticulosis without findings for acute diverticulitis. No mesenteric or retroperitoneal mass or adenopathy. The aorta demonstrates advanced atherosclerotic calcifications. The major branch vessels are patent. No focal aneurysm or dissection. Advanced iliac artery  calcifications are noted.  The bladder is unremarkable. The uterus and ovaries are grossly normal.  Advanced degenerative changes noted in the spine but no acute bony findings.  IMPRESSION: Markedly distended stool-filled rectum with pneumatosis consistent with fecal impaction.  Colonic diverticulosis without findings for acute diverticulitis.  Bilateral pleural effusions with overlying atelectasis.  Cardiac enlargement.   Electronically Signed   By: Loralie ChampagneMark  Gallerani M.D.   On: 04/19/2014 15:09   Dg Abd Portable 1v  04/19/2014   CLINICAL DATA:  NG tube confirmation  EXAM: PORTABLE ABDOMEN - 1 VIEW  COMPARISON:  None.  FINDINGS: Enteric tube tip projecting in the region of the stomach. The bowel gas pattern is normal. No radio-opaque calculi or other significant radiographic abnormality are seen. Large amount of stool in the region the rectosigmoid colon.  IMPRESSION: Large amount of fecal retention in the region the rectosigmoid colon. Nonobstructive bowel gas pattern. Enteric tube tip projecting region stomach.   Electronically Signed   By: Salome HolmesHector  Cooper M.D.   On: 04/19/2014 11:44    Microbiology: Recent Results (from the past 240 hour(s))  MRSA PCR SCREENING     Status: None   Collection Time    04/15/14  7:14 PM      Result Value Ref Range Status   MRSA by PCR NEGATIVE  NEGATIVE Final   Comment:            The GeneXpert MRSA Assay (FDA     approved for NASAL specimens     only), is one component of a     comprehensive MRSA colonization     surveillance program. It is not     intended to diagnose MRSA     infection nor to guide or     monitor treatment for     MRSA infections.  URINE CULTURE     Status: None   Collection Time    04/19/14  9:09 AM      Result Value Ref Range Status   Specimen Description URINE, CATHETERIZED   Final   Special Requests NONE   Final   Culture  Setup Time     Final   Value: 04/19/2014 11:55     Performed at [facility]   Colony Count     Final    Value: NO GROWTH     Performed at [facility]   Culture     Final   Value: NO GROWTH     Performed at [facility]   Report Status 04/20/2014 FINAL   Final  CLOSTRIDIUM DIFFICILE BY PCR     Status: Abnormal   Collection Time    04/19/14 11:37 PM      Result Value Ref Range Status   C difficile by pcr POSITIVE (*) NEGATIVE Final   Comment: CRITICAL RESULT CALLED TO, READ BACK BY AND VERIFIED WITH:     STEIN,O RN 04/20/14 1020 WOOTEN,K     Labs: Basic Metabolic Panel:  Recent Labs Lab 04/18/14 0455 04/19/14 0340 04/20/14 0344 04/21/14 0350 04/22/14 0505  NA 146 149* 145 147 146  K 3.9 4.1 3.3* 3.2* 4.3  CL 107 113* 106 108 106  CO2 24 20 25 28 27   GLUCOSE 133* 122* 114* 89 114*  BUN 23 21 19 14 16   CREATININE 0.97 0.91 0.88 0.76 0.76  CALCIUM 10.0 9.7 9.2 8.8 9.3  MG  --   --  1.9 2.0 2.2  PHOS  --   --  2.6 2.7 2.7   Liver Function Tests:  Recent Labs Lab 04/15/14 1344 04/19/14 1035  AST 22 29  ALT 16 23  ALKPHOS 91 74  BILITOT  0.5 0.6  PROT 8.0 6.7  ALBUMIN 4.1 3.4*    Recent Labs Lab 04/19/14 1035  LIPASE 68*    Recent Labs Lab 04/18/14 0935  AMMONIA 22   CBC:  Recent Labs Lab 04/15/14 1344  04/18/14 0455 04/19/14 1035 04/20/14 0344 04/21/14 0350 04/22/14 0505  WBC 6.8  < > 6.9 7.3 9.3 4.9 6.9  NEUTROABS 4.2  --   --   --   --   --   --   HGB 13.6  < > 11.5* 11.8* 11.3* 10.9* 11.0*  HCT 43.9  < > 36.7 37.9 36.3 35.2* 35.9*  MCV 85.4  < > 84.0 86.5 85.2 87.3 86.1  PLT 190  < > 165 136* 135* 122* 157  < > = values in this interval not displayed. Cardiac Enzymes:  Recent Labs Lab 04/16/14 0139 04/16/14 0520  TROPONINI <0.30 <0.30   BNP: BNP (last 3 results) No results found for this basename: PROBNP,  in the last 8760 hours CBG:  Recent Labs Lab 04/20/14 2335 04/21/14 0349 04/21/14 0811 04/21/14 1126 04/21/14 1634  GLUCAP 118* 89 118* 132* 111*    Time coordinating discharge: 45  minutes  Signed:  Duval Macleod  [facility] 04/22/2014, 8:04 AM

## 2014-04-22 NOTE — Progress Notes (Signed)
Subjective: Patient has no complaints.  Able to follow simple commands. Speech minimal but fluent.    Objective: Current vital signs: BP 128/78  Pulse 78  Temp(Src) 98.2 F (36.8 C) (Oral)  Resp 16  Ht 5\' 4"  (1.626 m)  Wt 44.3 kg (97 lb 10.6 oz)  BMI 16.76 kg/m2  SpO2 99% Vital signs in last 24 hours: Temp:  [97.4 F (36.3 C)-98.2 F (36.8 C)] 98.2 F (36.8 C) (07/07 0429) Pulse Rate:  [78-87] 78 (07/07 0429) Resp:  [16-20] 16 (07/07 0429) BP: (123-138)/(76-94) 128/78 mmHg (07/07 0429) SpO2:  [97 %-99 %] 99 % (07/07 0429) Weight:  [44.3 kg (97 lb 10.6 oz)] 44.3 kg (97 lb 10.6 oz) (07/07 0429)  Intake/Output from previous day: 07/06 0701 - 07/07 0700 In: 400 [P.O.:400] Out: -  Intake/Output this shift:   Nutritional status: Dysphagia  Neurologic Exam: Mental Status:  Patient awake and alert. Speech fluent. Follows simple commands.  Cranial Nerves:  II-responds to visual threat bilaterally.  III/IV/VI-Pupils were equal and reactive. EOM's intact  VII-no facial numbness and no facial weakness.  VII-grossly intact  IX/X: intact gag  XI: bilateral shoulder shrug  ZOX:[id number]:unable to perform  Motor: Able to move all extremities, upper extremities greater than lower extremities.  Sensory: Responds to noxious stimuli throughout  Plantars: Mute bilaterally    Lab Results: Basic Metabolic Panel:  Recent Labs Lab 04/18/14 0455 04/19/14 0340 04/20/14 0344 04/21/14 0350 04/22/14 0505  NA 146 149* 145 147 146  K 3.9 4.1 3.3* 3.2* 4.3  CL 107 113* 106 108 106  CO2 24 20 25 28 27   GLUCOSE 133* 122* 114* 89 114*  BUN 23 21 19 14 16   CREATININE 0.97 0.91 0.88 0.76 0.76  CALCIUM 10.0 9.7 9.2 8.8 9.3  MG  --   --  1.9 2.0 2.2  PHOS  --   --  2.6 2.7 2.7    Liver Function Tests:  Recent Labs Lab 04/15/14 1344 04/19/14 1035  AST 22 29  ALT 16 23  ALKPHOS 91 74  BILITOT 0.5 0.6  PROT 8.0 6.7  ALBUMIN 4.1 3.4*    Recent Labs Lab 04/19/14 1035  LIPASE 68*     Recent Labs Lab 04/18/14 0935  AMMONIA 22    CBC:  Recent Labs Lab 04/15/14 1344  04/18/14 0455 04/19/14 1035 04/20/14 0344 04/21/14 0350 04/22/14 0505  WBC 6.8  < > 6.9 7.3 9.3 4.9 6.9  NEUTROABS 4.2  --   --   --   --   --   --   HGB 13.6  < > 11.5* 11.8* 11.3* 10.9* 11.0*  HCT 43.9  < > 36.7 37.9 36.3 35.2* 35.9*  MCV 85.4  < > 84.0 86.5 85.2 87.3 86.1  PLT 190  < > 165 136* 135* 122* 157  < > = values in this interval not displayed.  Cardiac Enzymes:  Recent Labs Lab 04/16/14 0139 04/16/14 0520  TROPONINI <0.30 <0.30    Lipid Panel: No results found for this basename: CHOL, TRIG, HDL, CHOLHDL, VLDL, LDLCALC,  in the last 168 hours  CBG:  Recent Labs Lab 04/20/14 2335 04/21/14 0349 04/21/14 0811 04/21/14 1126 04/21/14 1634  GLUCAP 118* 89 118* 132* 111*    Microbiology: Results for orders placed during the hospital encounter of 04/15/14  MRSA PCR SCREENING     Status: None   Collection Time    04/15/14  7:14 PM      Result Value Ref Range  Status   MRSA by PCR NEGATIVE  NEGATIVE Final   Comment:            The GeneXpert MRSA Assay (FDA     approved for NASAL specimens     only), is one component of a     comprehensive MRSA colonization     surveillance program. It is not     intended to diagnose MRSA     infection nor to guide or     monitor treatment for     MRSA infections.  URINE CULTURE     Status: None   Collection Time    04/19/14  9:09 AM      Result Value Ref Range Status   Specimen Description URINE, CATHETERIZED   Final   Special Requests NONE   Final   Culture  Setup Time     Final   Value: 04/19/2014 11:55     [facility]formed at [facility]   Colony Count     Final   Value: NO GROWTH     [facility]   Culture     Final   Value: NO GROWTH     Performed at [facility]   Report Status 04/20/2014 FINAL   Final  CLOSTRIDIUM DIFFICILE BY PCR     Status: Abnormal   Collection Time     04/19/14 11:37 PM      Result Value Ref Range Status   C difficile by pcr POSITIVE (*) NEGATIVE Final   Comment: CRITICAL RESULT CALLED TO, READ BACK BY AND VERIFIED WITH:     STEIN,O RN 04/20/14 1020 WOOTEN,K    Coagulation Studies: No results found for this basename: LABPROT, INR,  in the last 72 hours  Imaging: No results found.  Medications:  Scheduled: . aspirin  325 mg Oral Daily  . citalopram  20 mg Oral Daily  . diltiazem  60 mg Oral 4 times per day  . ferrous sulfate  325 mg Oral Q breakfast  . fluticasone  1 spray Each Nare QHS  . lacosamide  50 mg Oral BID  . levothyroxine  200 mcg Oral QAC breakfast  . lisinopril  5 mg Oral Daily  . metroNIDAZOLE  500 mg Oral 3 times per day  . pantoprazole  40 mg Oral BID AC  . potassium chloride  60 mEq Oral Daily  . saccharomyces boulardii  250 mg Oral BID  . vitamin B-12  1,000 mcg Oral Daily    Assessment/Plan:  Patient continues to improve. No further seizure activity noted. On Vimpat.   Recommendations:  1. Continue Vimpat at current dose  No further neurologic intervention is recommended at this time.  If further questions arise, please call or page at that time.  Thank you for allowing neurology to participate in the care of this patient.  Felicie MornDavid Geron Mulford PA-C [facility] [phone]  04/22/2014, 1:23 PM

## 2014-04-22 NOTE — Progress Notes (Signed)
Speech Language Pathology Treatment: Dysphagia  Patient Details Name: [name] [name] MRN: [id number] DOB: [birth year] Today's Date: 04/22/2014 Time: 0830-0920 SLP Time Calculation (minLaurena [name]): 50 min  Assessment / Plan / Recommendation Clinical Impression  Pt seen to facilitate intake of am meals with safe feeding strategies and demonstration of methods to caregiver and discussion of risk with son.   Pt has apparently not been successful with am meal with staff as tray was mostly untouched. SLP positioned pt and set up environment to increase attention to PO. Provided hand over hand assist - letting pt hold cup and giving assist to move cup to lips and increase tactile awareness of straw (resistant to cup) and taste POs to initiate more natural, automatic intake. Pt with significantly delayed swallow response with intermittent cough with PO. Coughing reduced by SLP facilitating timing of swallow, removing straw with each sip, waiting for swallow and replacing straw. Provided verbal and written instruction to new caregiver and demonstrated with her return demonstrating technique.   Despite these strategies and modified diet, pt still showing evidence of a moderate to severe dysphagia with high risk of aspiration. Discussed this risk with pts son [name] LericheMark, and attempted to [facility], but had to leave message. Pt will need f/u at SNF with SLP for continued treatment.    HPI HPI: 80 y.o. female with history of atrial fibrillation on Xarelto, hypertension, previous stroke, dementia and hypothyroidism was brought to the emergency room with acute change in mental status at the facility where she resides.  Dx of new onset focal seizure activity with secondary generalization.  CT scan of her head was unremarkable.  Per RD note, pt's diet at SNF changed recently to pureed; since that time she has been eating very well and has gained 2 lbs.     Pertinent Vitals NA  SLP Plan  Continue with current plan  of care    Recommendations Diet recommendations: Dysphagia 1 (puree);Nectar-thick liquid Liquids provided via: Straw Medication Administration: Crushed with puree Supervision: Full supervision/cueing for compensatory strategies Compensations: Slow rate;Small sips/bites Postural Changes and/or Swallow Maneuvers: Seated upright 90 degrees              Oral Care Recommendations: Oral care BID Follow up Recommendations: Skilled Nursing facility;24 hour supervision/assistance Plan: Continue with current plan of care    GO    [facility] WatermanBonnie Xzavier Swinger, MA CCC-SLP [phone]  [name] [name], [name] [name] 04/22/2014, 9:20 AM

## 2014-04-23 LAB — GLUCOSE, CAPILLARY
GLUCOSE-CAPILLARY: 103 mg/dL — AB (ref 70–99)
Glucose-Capillary: 103 mg/dL — ABNORMAL HIGH (ref 70–99)
Glucose-Capillary: 128 mg/dL — ABNORMAL HIGH (ref 70–99)

## 2014-04-23 MED ORDER — FLUCONAZOLE 100 MG PO TABS: 100.0000 mg | ORAL_TABLET | Freq: Every day | ORAL | Status: DC

## 2014-04-23 MED ORDER — FLUCONAZOLE 100 MG PO TABS
100.0000 mg | ORAL_TABLET | Freq: Every day | ORAL | Status: DC
  Filled 2014-04-23: qty 1

## 2014-04-23 NOTE — Progress Notes (Signed)
Family hired a Engineer, siteprivate sitter Elita Quick(Pam) to stay at bedside with patient. Stanton KidneyCURRY, Kilynn Fitzsimmons R

## 2014-04-23 NOTE — Discharge Summary (Signed)
Physician Discharge Summary  Tranisha Tissue ZOX:[id number] DOB: 01/18/1924 DOA: 04/15/2014  PCP: Oneal Grout, MD  Admit date: 04/15/2014 Discharge date: 04/23/2014  Recommendations for Outpatient Follow-up:  1. Transfer back to [facility].  Continue speech therapy assessments and practice for meals 2. Please continue to encourage her to eat frequently and assist with meals, particularly while she is still recovering from this illness.  See below for speech therapy recommendations.   3. Air mattress if available and frequent turning  4. Start vimpat for seizures 5. Continue metronidazole through 7/19 and may stop on 7/20 if diarrhea resolved.  If diarrhea persists, continue for an additional week.   6. If she regains ability to tolerate whole pills, consider stopping aspirin and restarting xarelto 7. Repeat TFTs in 2-4 weeks. 8. DNR 9. Daily weights.  If she gains more than 3-lbs in 1 day or 5-lbs in 1 week, or if she develops worsening SOB or swelling of the legs, please notify supervising MD.    Discharge Diagnoses:  Principal Problem:   Seizures Active Problems:   HYPOTHYROIDISM   Atrial fibrillation   Protein malnutrition   UTI (lower urinary tract infection)   GERD (gastroesophageal reflux disease)   Hypertensive urgency   Dementia   Protein-calorie malnutrition, severe   Impacted stool in rectum   Enteritis due to Clostridium difficile   Hypernatremia   Discharge Condition: stable, improved  Diet recommendation:   Dysphagia 1 (puree);Nectar-thick liquid  Liquids provided via: Straw  Medication Administration: Crushed with puree  Supervision: Full supervision/cueing for compensatory strategies  Compensations: Slow rate;Small sips/bites  Postural Changes and/or Swallow Maneuvers: Seated upright 90 degrees    Wt Readings from Last 3 Encounters:  04/23/14 44.2 kg (97 lb 7.1 oz)  06/11/13 46.176 kg (101 lb 12.8 oz)  05/20/13 46.993 kg (103 lb 9.6 oz)    History  of present illness:  [name] [name] is a 80 y.o. female SNF resident with multiple medical problems as listed below including CVA/TIA, Afib on xarelto, HTN, Dementia, Hypothyroidism who presents with the above complaints. Per family pt was found slumped to right in her wheelchair at SNF, and son relates that she had jerking activity. When EMS arrived they reported that pt's eyes were gazing to the L. and she was unresponsive. Code stroke was called prior to arrival. Upon arrival she was found to be markedly elevated BPS, and tachy up to 14Os per EDP and she was given IV labetalol. Also while in ED she had a grandmal seizure>> she was given IV ativan and then loaded with IV keppra per Neuro, and pt has not had further seizures. At the time of my exam, pt is postictal. Head CT neg for acute findings, severe chronic involutional change noted.   Hospital Course:  Patient seen and examined, no change in medical condition. Ok to be transfer to SNF today.   Seizures, New onset. May have been triggered by underlying infection, but no previous seizure history.  Head CT neg. MRI demonstrates no acute finding. She has chronic small vessel ischemic changes an old small infarctions affecting the left cerebellum and left thalamus.  EEG performed after initiation of AEDs demonstrated slowing without epileptiform activity.  She was found to have a urinary tract infection and C. Diff colitis.  Ammonia level was wnl.  Initially, she was started on keppra by Neurology, however, she remained lethargic for several days and was unable to eat and drink.  She was changed to vimpat and finally recovered her  alertness and ability to follow instructions.  She had some expressive aphasia which gradually has improved but has not fully resolved.    UTI/cystitis.  She completed 5 days of ceftriaxone  C. Diff colitis, started on flagyl on 7/5.  Please continue flagyl through 7/19 and if diarrhea has stopped, may stop flagyl and monitor  for diarrhea.  If diarrhea continues, continue flagyl for an additional week and reassess.    Severe protein calorie malnutrition in setting of severe dysphagia:  She was evaluated daily by speech therapy and initially was not awake enough to eat.  She had a panda placed for tube feeds for a few days and her mentation improved.  She was cleared for a dysphagia 1 diet with nectar thick liquids which she should continue for now.  Please have speech therapy reassess after she returns to SNF.    Stool impaction with LLQ abdominal pain.  She developed abdominal pain (before she developed diarrhea.  It was reported that she had been having bowel movements.  Because her LFTs, lipase, lactic acid only demonstrated a mildly abnl lipase, she underwent CT scan which showed impaction without colitis.  She underwent manual disimpaction and afterwards started having diarrhea which was positive for C. Diff.  Once her diarrhea starts to slow, she may need to restart her stool softeners to prevent further impactions.  Atrial fibrillation with RVR, resolved. Currently rate controlled.  Initially tachycardic to the 120s and was started on IV metoprolol and rectal aspirin.  After she had panda placed, she was transitioned back to diltiazem, ahtlugh this was changed to non-extended release which can be crushed in purees.  She may NOT resume her xarelto which cannot be crushed and should continue daily aspirin for now.  If her eating improves and she is able to tolerate whole pills, consider restarting xarelto.  Consider transitioning to coumadin if her dysphagia does not improve.    Acute on chronic diastolic heart failure, hold lasix in setting of frequent diarrhea and poor oral intake.  Malignant HTN, BP initially high, however, it has been wnl on dilt + ACEI.  HYPOTHYROIDISM, TSH likely to be abnl in setting of acute illness.  Continue synthroid.  Repeat TFTs in 2-4 weeks   GERD (gastroesophageal reflux disease),  stable, hold PPI due to C. Diff colitis.  Hypernatremia due to free water deficit. She received free water flushes while tube feeds were running and this normalized.  Encourage patient hand-feeding.  Repeat BMP in 1 week.  Hypokalemia, likely secondary to inadequate tube feed rate and diarrhea.  Oral supplements.  Hypoglycemia due to poor oral intake and low reserves, resolved.   Depression/dementia, stable, continue celexa  Iron and B12 deficiency anemia, Hemoglobin stable.  Continue iron and B12 supplements  Oral candidiasis; start oral fluconazole, for 5 days.   Consultants:  Neurology  Palliative care Procedures:  CT head  EEG  MRI brain  Panda 7/4  CT abd/pelvis 7/4  KUB 7/4 Antibiotics:  Cipro 6/30 >> 7/1  Ceftriaxone 7/1 >> 7/5 Flagyl 7/5 >>  Discharge Exam: Filed Vitals:   04/23/14 0510  BP: 135/99  Pulse: 99  Temp: 97.7 F (36.5 C)  Resp: 18   Filed Vitals:   04/22/14 0429 04/22/14 1355 04/22/14 2200 04/23/14 0510  BP: 128/78 132/87 130/79 135/99  Pulse: 78 86 81 99  Temp: 98.2 F (36.8 C) 97.9 F (36.6 C) 97.4 F (36.3 C) 97.7 F (36.5 C)  TempSrc: Oral Oral Oral Oral  Resp: 16  16 18 18   Height:      Weight: 44.3 kg (97 lb 10.6 oz)   44.2 kg (97 lb 7.1 oz)  SpO2: 99% 97% 97% 93%   General: Cachectic CF, No acute distress, awake.  HEENT: NCAT, MMM white plaque tongue.  Cardiovascular: IRRR, nl S1, S2  Positive murmur. Respiratory: CTAB anteriorly, no increased WOB  Abdomen: Hyperactive BS, soft, mildly distended, nontender.    Discharge Instructions      Discharge Instructions   Call MD for:  difficulty breathing, headache or visual disturbances    Complete by:  As directed      Call MD for:  extreme fatigue    Complete by:  As directed      Call MD for:  hives    Complete by:  As directed      Call MD for:  persistant dizziness or light-headedness    Complete by:  As directed      Call MD for:  persistant nausea and vomiting     Complete by:  As directed      Call MD for:  severe uncontrolled pain    Complete by:  As directed      Call MD for:  temperature >100.4    Complete by:  As directed      Increase activity slowly    Complete by:  As directed             Medication List    STOP taking these medications       diltiazem 120 MG 24 hr capsule  Commonly known as:  DILACOR XR  Replaced by:  diltiazem 60 MG tablet     furosemide 40 MG tablet  Commonly known as:  LASIX     HYDROcodone-acetaminophen 5-325 MG per tablet  Commonly known as:  NORCO/VICODIN     omeprazole 40 MG capsule  Commonly known as:  PRILOSEC     PEG 3350 Powd     Rivaroxaban 15 MG Tabs tablet  Commonly known as:  XARELTO      TAKE these medications       acetaminophen 500 MG tablet  Commonly known as:  TYLENOL  Take 500 mg by mouth 2 (two) times daily.     aspirin 81 MG tablet  Take 1 tablet (81 mg total) by mouth daily.     bisacodyl 10 MG suppository  Commonly known as:  DULCOLAX  Place 10 mg rectally See admin instructions. Every 3 days as needed for no bowl movement     citalopram 20 MG tablet  Commonly known as:  CELEXA  Take 20 mg by mouth daily.     diltiazem 60 MG tablet  Commonly known as:  CARDIZEM  Take 1 tablet (60 mg total) by mouth every 6 (six) hours.     fluconazole 100 MG tablet  Commonly known as:  DIFLUCAN  Take 1 tablet (100 mg total) by mouth daily.     fluticasone 50 MCG/ACT nasal spray  Commonly known as:  FLONASE  Place 1 spray into the nose at bedtime.     hydroxypropyl methylcellulose 2.5 % ophthalmic solution  Commonly known as:  ISOPTO TEARS  Place 1 drop into both eyes 4 (four) times daily as needed.     ipratropium-albuterol 0.5-2.5 (3) MG/3ML Soln  Commonly known as:  DUONEB  Take 3 mLs by nebulization every 6 (six) hours as needed (for congestion).     iron polysaccharides 150 MG capsule  Commonly known  as:  NIFEREX  Take 150 mg by mouth 2 (two) times daily.      lacosamide 50 MG Tabs tablet  Commonly known as:  VIMPAT  Take 1 tablet (50 mg total) by mouth 2 (two) times daily.     levothyroxine 200 MCG tablet  Commonly known as:  SYNTHROID, LEVOTHROID  Take 200 mcg by mouth daily before breakfast.     metroNIDAZOLE 500 MG tablet  Commonly known as:  FLAGYL  Take 1 tablet (500 mg total) by mouth every 8 (eight) hours.     potassium chloride SA 20 MEQ tablet  Commonly known as:  K-DUR,KLOR-CON  Take 20 mEq by mouth daily.     PRESERVISION AREDS 2 PO  Take 1 capsule by mouth 2 (two) times daily.     PROCEL Powd  Take 1 scoop by mouth 3 (three) times daily.     sennosides-docusate sodium 8.6-50 MG tablet  Commonly known as:  SENOKOT-S  Take 1 tablet by mouth 2 (two) times daily.     vitamin B-12 1000 MCG tablet  Commonly known as:  CYANOCOBALAMIN  Take 1,000 mcg by mouth daily.       Follow-up Information   Follow up with Oneal Grout, MD.   Specialty:  Internal Medicine   Contact information:   [address] [place] [address] [phone]        The results of significant diagnostics from this hospitalization (including imaging, microbiology, ancillary and laboratory) are listed below for reference.    Significant Diagnostic Studies: Ct Head Wo Contrast  04/15/2014   CLINICAL DATA:  Code stroke, posturing, left gaze  EXAM: CT HEAD WITHOUT CONTRAST  TECHNIQUE: Contiguous axial images were obtained from the base of the skull through the vertex without intravenous contrast.  COMPARISON:  02/15/2012  FINDINGS: Severe diffuse cortical atrophy and severe low-attenuation diffusely in the deep white matter. Sub cm lacunar infarct left thalamus. Sub cm lacunar infarcts adjacent to 1 another in the inferior left cerebellar hemisphere, stable. Sub cm lacunar infarct deep right periventricular white matter, stable. No evidence of vascular territory infarct. No hemorrhage or extra axial fluid. No hydrocephalus or evidence of mass.  Calvarium is intact.  IMPRESSION: Severe chronic involutional change with no acute findings. Critical Value/emergent results were called by telephone at the time of interpretation on 04/15/2014 at 1:58 PM to Dr. Roseanne Reno , who verbally acknowledged these results.   Electronically Signed   By: Esperanza Heir M.D.   On: 04/15/2014 13:58   Mr Brain Wo Contrast  04/16/2014   CLINICAL DATA:  Chronic vascular disease and dementia. Mental status changes.  EXAM: MRI HEAD WITHOUT CONTRAST  TECHNIQUE: Multiplanar, multiecho pulse sequences of the brain and surrounding structures were obtained without intravenous contrast.  COMPARISON:  Head CT 04/15/2014.  FINDINGS: Diffusion imaging does not show any acute or subacute infarction. There chronic small-vessel changes affecting the pons. There are old small vessel cerebellar infarctions on the left. There are chronic small-vessel changes of the thalami I with left thalamic old lacunar infarction. There are chronic small-vessel ischemic changes throughout the cerebral hemispheric white matter with hemispheric atrophy. No large vessel territory infarction. No mass lesion, hemorrhage, hydrocephalus or extra-axial collection. No pituitary mass. No inflammatory sinus disease. No skull or skullbase lesion.  IMPRESSION: No acute or reversible finding. Chronic small vessel ischemic changes throughout the brain. Old small vessel infarctions affecting the left cerebellum and the left thalamus.   Electronically Signed   By: Loraine Leriche  Shogry M.D.   On: 04/16/2014 10:10   Ct Abdomen Pelvis W Contrast  04/19/2014   CLINICAL DATA:  Left lower quadrant abdominal pain.  EXAM: CT ABDOMEN AND PELVIS WITH CONTRAST  TECHNIQUE: Multidetector CT imaging of the abdomen and pelvis was performed using the standard protocol following bolus administration of intravenous contrast.  CONTRAST:  80mL OMNIPAQUE IOHEXOL 300 MG/ML  SOLN  COMPARISON:  03/13/2011  FINDINGS: The lung bases demonstrate moderate-sized  bilateral pleural effusions, right greater than left with overlying atelectasis. The heart is enlarged. No pericardial effusion. There is an NG tube coursing down the esophagus and into the stomach.  The liver is unremarkable. No focal hepatic lesions or intrahepatic biliary dilatation. There is reflux of contrast down the IVC and into the hepatic veins and there may be a component of chronic hepatic congestion. The spleen is normal in size. No focal lesions. The adrenal glands are unremarkable. The kidneys demonstrate scarring changes and cysts.  The stomach, duodenum, small bowel and colon are grossly normal. There is a large amount of stool in the rectum and pneumatosis consistent with fecal impaction. Diffuse colonic diverticulosis without findings for acute diverticulitis. No mesenteric or retroperitoneal mass or adenopathy. The aorta demonstrates advanced atherosclerotic calcifications. The major branch vessels are patent. No focal aneurysm or dissection. Advanced iliac artery calcifications are noted.  The bladder is unremarkable. The uterus and ovaries are grossly normal.  Advanced degenerative changes noted in the spine but no acute bony findings.  IMPRESSION: Markedly distended stool-filled rectum with pneumatosis consistent with fecal impaction.  Colonic diverticulosis without findings for acute diverticulitis.  Bilateral pleural effusions with overlying atelectasis.  Cardiac enlargement.   Electronically Signed   By: Loralie Champagne M.D.   On: 04/19/2014 15:09   Dg Abd Portable 1v  04/19/2014   CLINICAL DATA:  NG tube confirmation  EXAM: PORTABLE ABDOMEN - 1 VIEW  COMPARISON:  None.  FINDINGS: Enteric tube tip projecting in the region of the stomach. The bowel gas pattern is normal. No radio-opaque calculi or other significant radiographic abnormality are seen. Large amount of stool in the region the rectosigmoid colon.  IMPRESSION: Large amount of fecal retention in the region the rectosigmoid colon.  Nonobstructive bowel gas pattern. Enteric tube tip projecting region stomach.   Electronically Signed   By: Salome Holmes M.D.   On: 04/19/2014 11:44    Microbiology: Recent Results (from the past 240 hour(s))  MRSA PCR SCREENING     Status: None   Collection Time    04/15/14  7:14 PM      Result Value Ref Range Status   MRSA by PCR NEGATIVE  NEGATIVE Final   Comment:            The GeneXpert MRSA Assay (FDA     approved for NASAL specimens     only), is one component of a     comprehensive MRSA colonization     surveillance program. It is not     intended to diagnose MRSA     infection nor to guide or     monitor treatment for     MRSA infections.  URINE CULTURE     Status: None   Collection Time    04/19/14  9:09 AM      Result Value Ref Range Status   Specimen Description URINE, CATHETERIZED   Final   Special Requests NONE   Final   Culture  Setup Time     Final  Value: 04/19/2014 11:55     Performed at [facility]   Colony Count     Final   Value: NO GROWTH     Performed at [facility]   Culture     Final   Value: NO GROWTH     Performed at [facility]   Report Status 04/20/2014 FINAL   Final  CLOSTRIDIUM DIFFICILE BY PCR     Status: Abnormal   Collection Time    04/19/14 11:37 PM      Result Value Ref Range Status   C difficile by pcr POSITIVE (*) NEGATIVE Final   Comment: CRITICAL RESULT CALLED TO, READ BACK BY AND VERIFIED WITH:     STEIN,O RN 04/20/14 1020 WOOTEN,K     Labs: Basic Metabolic Panel:  Recent Labs Lab 04/18/14 0455 04/19/14 0340 04/20/14 0344 04/21/14 0350 04/22/14 0505  NA 146 149* 145 147 146  K 3.9 4.1 3.3* 3.2* 4.3  CL 107 113* 106 108 106  CO2 24 20 25 28 27   GLUCOSE 133* 122* 114* 89 114*  BUN 23 21 19 14 16   CREATININE 0.97 0.91 0.88 0.76 0.76  CALCIUM 10.0 9.7 9.2 8.8 9.3  MG  --   --  1.9 2.0 2.2  PHOS  --   --  2.6 2.7 2.7   Liver Function Tests:  Recent Labs Lab 04/19/14 1035  AST 29   ALT 23  ALKPHOS 74  BILITOT 0.6  PROT 6.7  ALBUMIN 3.4*    Recent Labs Lab 04/19/14 1035  LIPASE 68*    Recent Labs Lab 04/18/14 0935  AMMONIA 22   CBC:  Recent Labs Lab 04/18/14 0455 04/19/14 1035 04/20/14 0344 04/21/14 0350 04/22/14 0505  WBC 6.9 7.3 9.3 4.9 6.9  HGB 11.5* 11.8* 11.3* 10.9* 11.0*  HCT 36.7 37.9 36.3 35.2* 35.9*  MCV 84.0 86.5 85.2 87.3 86.1  PLT 165 136* 135* 122* 157   Cardiac Enzymes: No results found for this basename: CKTOTAL, CKMB, CKMBINDEX, TROPONINI,  in the last 168 hours BNP: BNP (last 3 results) No results found for this basename: PROBNP,  in the last 8760 hours CBG:  Recent Labs Lab 04/21/14 2356 04/22/14 0423 04/22/14 1145 04/23/14 0032 04/23/14 0613  GLUCAP 98 84 121* 103* 103*    Time coordinating discharge: 45 minutes  Signed:  Hartley Barefootegalado, Slyvester Latona A  [facility] 04/23/2014, 10:33 AM

## 2014-04-23 NOTE — Progress Notes (Signed)
IV discontinued. Patient discharged back [facility] transport. Report called and given to RN. Stanton KidneyCURRY, Dres[place] Lozito R

## 2014-04-23 NOTE — Progress Notes (Addendum)
Clinical Social Worker facilitated patient discharge including contacting patient's daughter and facility to confirm patient discharge plans.  Clinical information faxed to facility and patient's daughter agreeable with plan.  CSW arranged ambulance transport via [facility] to EMS for 2pm.  RN to call report prior to discharge.  Clinical Social Worker will sign off for now as social work intervention is no longer needed. Please consult us again if new need arises.  Maree KrabbeLindsay Murad Staples, MSW, Theresia MajorsLCSWA [phone]

## 2014-04-24 ENCOUNTER — Other Ambulatory Visit: Payer: Self-pay | Admitting: *Deleted

## 2014-04-24 MED ORDER — HYDROCODONE-ACETAMINOPHEN 5-325 MG PO TABS: 1.0000 | ORAL_TABLET | Freq: Four times a day (QID) | ORAL | Status: DC | PRN

## 2014-04-24 MED ORDER — LACOSAMIDE 50 MG PO TABS: ORAL_TABLET | ORAL | Status: DC

## 2014-04-24 NOTE — Telephone Encounter (Signed)
[facility] @ [phone]

## 2014-04-24 NOTE — Telephone Encounter (Signed)
[facility] 

## 2014-04-28 ENCOUNTER — Non-Acute Institutional Stay (SKILLED_NURSING_FACILITY): Payer: PRIVATE HEALTH INSURANCE | Admitting: Internal Medicine

## 2014-04-28 DIAGNOSIS — I10 Essential (primary) hypertension: Secondary | ICD-10-CM

## 2014-04-28 DIAGNOSIS — R5381 Other malaise: Secondary | ICD-10-CM

## 2014-04-28 DIAGNOSIS — B370 Candidal stomatitis: Secondary | ICD-10-CM

## 2014-04-28 DIAGNOSIS — E878 Other disorders of electrolyte and fluid balance, not elsewhere classified: Secondary | ICD-10-CM

## 2014-04-28 DIAGNOSIS — L8990 Pressure ulcer of unspecified site, unspecified stage: Secondary | ICD-10-CM

## 2014-04-28 DIAGNOSIS — L89309 Pressure ulcer of unspecified buttock, unspecified stage: Secondary | ICD-10-CM

## 2014-04-28 DIAGNOSIS — A0472 Enterocolitis due to Clostridium difficile, not specified as recurrent: Secondary | ICD-10-CM

## 2014-04-28 DIAGNOSIS — E46 Unspecified protein-calorie malnutrition: Secondary | ICD-10-CM

## 2014-04-28 DIAGNOSIS — R569 Unspecified convulsions: Secondary | ICD-10-CM

## 2014-04-28 DIAGNOSIS — F329 Major depressive disorder, single episode, unspecified: Secondary | ICD-10-CM

## 2014-04-28 DIAGNOSIS — N390 Urinary tract infection, site not specified: Secondary | ICD-10-CM

## 2014-04-28 DIAGNOSIS — F3289 Other specified depressive episodes: Secondary | ICD-10-CM

## 2014-04-28 DIAGNOSIS — F32A Depression, unspecified: Secondary | ICD-10-CM

## 2014-04-28 DIAGNOSIS — E039 Hypothyroidism, unspecified: Secondary | ICD-10-CM

## 2014-04-28 DIAGNOSIS — E40 Kwashiorkor: Secondary | ICD-10-CM

## 2014-04-28 DIAGNOSIS — K219 Gastro-esophageal reflux disease without esophagitis: Secondary | ICD-10-CM

## 2014-04-28 DIAGNOSIS — R531 Weakness: Secondary | ICD-10-CM

## 2014-04-28 DIAGNOSIS — I4891 Unspecified atrial fibrillation: Secondary | ICD-10-CM

## 2014-04-28 DIAGNOSIS — R5383 Other fatigue: Secondary | ICD-10-CM

## 2014-04-28 NOTE — Progress Notes (Signed)
Patient ID: [name] [name], [name]   DOB: Nov 15, 1923, 80 y.o.   MRN: [id number]     Facility: [facility]   PCP: Oneal Grout, MD  Code Status: dnr  Allergies  Allergen Reactions  . Penicillins     "red faced" and on [place]    Chief Complaint: readmission  HPI:  80 y/o [name] patient is readmitted to the facility for long term care after hospital admission from 04/15/14-04/23/14 with new onset seizures. Ct head and mri were negative for acute finding. EEG showed no epileptiform activity. [name] [name] was treated for uti and c.diff colitis. [name] [name] was seen by neurology and started on keppra and later switched to vimpat. Has some residual aphasia. [name] [name] also had fecal impaction and required manual disimpaction. [name] [name] also had afib with RVR.  [name] [name] is seen in her room sitting on her chair. [name] [name] now has a Engineer, site and is eating with assistance. Still has some loose stool but improved from before. [name] [name] is working with speech and physical therapy sand is under contact precautions at present. [name] [name] has dementia at baseline making hx taking and ros difficult. No falls reported. On nectar thick diet. Getting wound care for right buttock pressure ulcer  Review of Systems:  Constitutional: Negative for fever, chills, diaphoresis. Marland Kitchen  Respiratory: Negative for cough, sputum production, shortness of breath and wheezing.   Cardiovascular: Negative for chest pain, palpitations Gastrointestinal: Negative for heartburn, nausea, vomiting, abdominal pain Genitourinary: Negative for dysuria Musculoskeletal: has generalized weakness Skin: Negative for itching and rash.    Past Medical History  Diagnosis Date  . Renal insufficiency   . Hypertension   . A-fib   . Anxiety   . Depression   . Hypothyroidism   . Inguinal hernia     repaired 2011, right  . Osteoarthritis   . Cerebral vascular accident   . DJD (degenerative joint disease)   . Recurrent UTI   . C. difficile  colitis     recurrent  . Dementia   . Anemia   . Cataract   . Asteatotic eczema   . Peripheral vascular disease   . Hyponatremia   . Malnutrition   . Sepsis(995.91)   . TIA (transient ischemic attack)   . Urinary incontinence    Past Surgical History  Procedure Laterality Date  . Inguinal hernia repair    . Bladder surgery      prolapsed bladder  . Cataract extraction      bilateral   Social History:   reports that [name] [name] quit smoking about [name] years ago. Her smoking use included Cigarettes. [name] [name] smoked 0.00 packs per day. [name] [name] has never used smokeless tobacco. [name] [name] reports that [name] [name] does not drink alcohol or use illicit drugs.  Family History  Problem Relation Age of Onset  . Kidney Stones Mother   . Colon cancer Neg Hx     Medications: Patient's Medications  New Prescriptions   No medications on file  Previous Medications   ACETAMINOPHEN (TYLENOL) 500 MG TABLET    Take 500 mg by mouth 2 (two) times daily.   ASPIRIN 81 MG TABLET    Take 1 tablet (81 mg total) by mouth daily.   BISACODYL (DULCOLAX) 10 MG SUPPOSITORY    Place 10 mg rectally See admin instructions. Every 3 days as needed for no bowl movement   CITALOPRAM (CELEXA) 20 MG TABLET    Take 20 mg by mouth daily.     DILTIAZEM (CARDIZEM) 60 MG  TABLET    Take 1 tablet (60 mg total) by mouth every 6 (six) hours.   FLUCONAZOLE (DIFLUCAN) 100 MG TABLET    Take 1 tablet (100 mg total) by mouth daily.   FLUTICASONE (FLONASE) 50 MCG/ACT NASAL SPRAY    Place 1 spray into the nose at bedtime.    HYDROCODONE-ACETAMINOPHEN (NORCO/VICODIN) 5-325 MG PER TABLET    Take 1 tablet by mouth every 6 (six) hours as needed for moderate pain. Take 1 tablet by mouth every 6 hours as needed for pain *DO NO EXCEED 4 GM OF TYLENOL IN 24 HOURS*   HYDROXYPROPYL METHYLCELLULOSE (ISOPTO TEARS) 2.5 % OPHTHALMIC SOLUTION    Place 1 drop into both eyes 4 (four) times daily as needed.   IPRATROPIUM-ALBUTEROL (DUONEB) 0.5-2.5 (3) MG/3ML SOLN    Take 3 mLs by  nebulization every 6 (six) hours as needed (for congestion).   IRON POLYSACCHARIDES (NIFEREX) 150 MG CAPSULE    Take 150 mg by mouth 2 (two) times daily.   LACOSAMIDE (VIMPAT) 50 MG TABS TABLET    Take one tablet by mouth twice daily for seizures   LEVOTHYROXINE (SYNTHROID, LEVOTHROID) 200 MCG TABLET    Take 200 mcg by mouth daily before breakfast.   METRONIDAZOLE (FLAGYL) 500 MG TABLET    Take 1 tablet (500 mg total) by mouth every 8 (eight) hours.   MULTIPLE VITAMINS-MINERALS (PRESERVISION AREDS 2 PO)    Take 1 capsule by mouth 2 (two) times daily.   POTASSIUM CHLORIDE SA (K-DUR,KLOR-CON) 20 MEQ TABLET    Take 20 mEq by mouth daily.   PROTEIN (PROCEL) POWD    Take 1 scoop by mouth 3 (three) times daily.   SENNOSIDES-DOCUSATE SODIUM (SENOKOT-S) 8.6-50 MG TABLET    Take 1 tablet by mouth 2 (two) times daily.   VITAMIN B-12 (CYANOCOBALAMIN) 1000 MCG TABLET    Take 1,000 mcg by mouth daily.  Modified Medications   No medications on file  Discontinued Medications   No medications on file    Physical Exam: Filed Vitals:   04/28/14 1713  BP: 145/86  Pulse: 82  Temp: 97.5 F (36.4 C)  Resp: 18  SpO2: 95%   Constitutional: frail , in NAD Neck: Neck supple. No JVD present. No thyromegaly present.   Cardiovascular: Normal rate, regular rhythm and diminished peripheral pulses Respiratory: Effort normal and breath sounds normal. No respiratory distress. [name] [name] has no wheezes.   GI: Soft. Bowel sounds are normal. [name] [name] exhibits no distension. There is no tenderness.  Musculoskeletal: [name] [name] exhibits no edema. Is able to move extremities  but has generalized weakness Neurological: [name] [name] is alert.   Skin: Skin is warm and dry. Has pressure ulcer in right buttock, easy bruising noted   Labs reviewed: Basic Metabolic Panel:  Recent Labs  40/98/11 0344 04/21/14 0350 04/22/14 0505  NA 145 147 146  K 3.3* 3.2* 4.3  CL 106 108 106  CO2 25 28 27   GLUCOSE 114* 89 114*  BUN 19 14 16   CREATININE 0.88  0.76 0.76  CALCIUM 9.2 8.8 9.3  MG 1.9 2.0 2.2  PHOS 2.6 2.7 2.7   Liver Function Tests:  Recent Labs  04/15/14 1344 04/19/14 1035  AST 22 29  ALT 16 23  ALKPHOS 91 74  BILITOT 0.5 0.6  PROT 8.0 6.7  ALBUMIN 4.1 3.4*    Recent Labs  04/19/14 1035  LIPASE 68*    Recent Labs  04/18/14 0935  AMMONIA 22   CBC:  Recent Labs  04/15/14 1344  04/20/14 0344 04/21/14 0350 04/22/14 0505  WBC 6.8  < > 9.3 4.9 6.9  NEUTROABS 4.2  --   --   --   --   HGB 13.6  < > 11.3* 10.9* 11.0*  HCT 43.9  < > 36.3 35.2* 35.9*  MCV 85.4  < > 85.2 87.3 86.1  PLT 190  < > 135* 122* 157  < > = values in this interval not displayed. Cardiac Enzymes:  Recent Labs  04/16/14 0139 04/16/14 0520  TROPONINI <0.30 <0.30   BNP: No components found with this basename: POCBNP,  CBG:  Recent Labs  04/23/14 0032 04/23/14 0613 04/23/14 1211  GLUCAP 103* 103* 128*    Radiological Exams: Ct Head Wo Contrast  04/15/2014   CLINICAL DATA:  Code stroke, posturing, left gaze  EXAM: CT HEAD WITHOUT CONTRAST  TECHNIQUE: Contiguous axial images were obtained from the base of the skull through the vertex without intravenous contrast.  COMPARISON:  02/15/2012  FINDINGS: Severe diffuse cortical atrophy and severe low-attenuation diffusely in the deep white matter. Sub cm lacunar infarct left thalamus. Sub cm lacunar infarcts adjacent to 1 another in the inferior left cerebellar hemisphere, stable. Sub cm lacunar infarct deep right periventricular white matter, stable. No evidence of vascular territory infarct. No hemorrhage or extra axial fluid. No hydrocephalus or evidence of mass. Calvarium is intact.  IMPRESSION: Severe chronic involutional change with no acute findings. Critical Value/emergent results were called by telephone at the time of interpretation on 04/15/2014 at 1:58 PM to Dr. Roseanne RenoStewart , who verbally acknowledged these results.   Electronically Signed   By: Esperanza Heiraymond  Rubner M.D.   On: 04/15/2014  13:58   Mr Brain Wo Contrast  04/16/2014   CLINICAL DATA:  Chronic vascular disease and dementia. Mental status changes.  EXAM: MRI HEAD WITHOUT CONTRAST  TECHNIQUE: Multiplanar, multiecho pulse sequences of the brain and surrounding structures were obtained without intravenous contrast.  COMPARISON:  Head CT 04/15/2014.  FINDINGS: Diffusion imaging does not show any acute or subacute infarction. There chronic small-vessel changes affecting the pons. There are old small vessel cerebellar infarctions on the left. There are chronic small-vessel changes of the thalami I with left thalamic old lacunar infarction. There are chronic small-vessel ischemic changes throughout the cerebral hemispheric white matter with hemispheric atrophy. No large vessel territory infarction. No mass lesion, hemorrhage, hydrocephalus or extra-axial collection. No pituitary mass. No inflammatory sinus disease. No skull or skullbase lesion.  IMPRESSION: No acute or reversible finding. Chronic small vessel ischemic changes throughout the brain. Old small vessel infarctions affecting the left cerebellum and the left thalamus.   Electronically Signed   By: Paulina FusiMark  Shogry M.D.   On: 04/16/2014 10:10   Ct Abdomen Pelvis W Contrast  04/19/2014   CLINICAL DATA:  Left lower quadrant abdominal pain.  EXAM: CT ABDOMEN AND PELVIS WITH CONTRAST  TECHNIQUE: Multidetector CT imaging of the abdomen and pelvis was performed using the standard protocol following bolus administration of intravenous contrast.  CONTRAST:  80mL OMNIPAQUE IOHEXOL 300 MG/ML  SOLN  COMPARISON:  03/13/2011  FINDINGS: The lung bases demonstrate moderate-sized bilateral pleural effusions, right greater than left with overlying atelectasis. The heart is enlarged. No pericardial effusion. There is an NG tube coursing down the esophagus and into the stomach.  The liver is unremarkable. No focal hepatic lesions or intrahepatic biliary dilatation. There is reflux of contrast down the IVC  and into the hepatic veins and there may be a component  of chronic hepatic congestion. The spleen is normal in size. No focal lesions. The adrenal glands are unremarkable. The kidneys demonstrate scarring changes and cysts.  The stomach, duodenum, small bowel and colon are grossly normal. There is a large amount of stool in the rectum and pneumatosis consistent with fecal impaction. Diffuse colonic diverticulosis without findings for acute diverticulitis. No mesenteric or retroperitoneal mass or adenopathy. The aorta demonstrates advanced atherosclerotic calcifications. The major branch vessels are patent. No focal aneurysm or dissection. Advanced iliac artery calcifications are noted.  The bladder is unremarkable. The uterus and ovaries are grossly normal.  Advanced degenerative changes noted in the spine but no acute bony findings.  IMPRESSION: Markedly distended stool-filled rectum with pneumatosis consistent with fecal impaction.  Colonic diverticulosis without findings for acute diverticulitis.  Bilateral pleural effusions with overlying atelectasis.  Cardiac enlargement.   Electronically Signed   By: Loralie Champagne M.D.   On: 04/19/2014 15:09   Dg Abd Portable 1v  04/19/2014   CLINICAL DATA:  NG tube confirmation  EXAM: PORTABLE ABDOMEN - 1 VIEW  COMPARISON:  None.  FINDINGS: Enteric tube tip projecting in the region of the stomach. The bowel gas pattern is normal. No radio-opaque calculi or other significant radiographic abnormality are seen. Large amount of stool in the region the rectosigmoid colon.  IMPRESSION: Large amount of fecal retention in the region the rectosigmoid colon. Nonobstructive bowel gas pattern. Enteric tube tip projecting region stomach.   Electronically Signed   By: Salome Holmes M.D.   On: 04/19/2014 11:44    Assessment/Plan  Generalized weakness Will have her work with physical therapy and occupational therapy team to help with gait training and muscle strengthening  exercises.fall precautions. Skin care. Encourage to be out of bed.   Dysphagia Continue nectar thick feed, aspiration precautions, assistance with feed  Pressure ulcer Needs pressure ulcer prophylaxis. Continue vit c and zince supplement with skin care  C.diff colitis Continue flagyl until 05/04/14, continue contact precautions, on probiotics. Monitor stool  Seizures New onset. Continue vimpat 50 mg bid for now. Seizure precautions  UTI Completed her antibiotics, encourage hydration and monitor clinically  afib Continue aspirin and diltiazem for now. Off xarelto with her dysphagia  Severe protein calorie malnutrition  With her dementia, medical comorbidities and dysphagia. Monitor po intake. Continue skin care.   Hypothyroidism Continue her syntrhoid 200 mcg daily,monitor tsh  GERD Controlled. Off ppi with her c.diff colitis for now  Oral candidiasis Completed her fluconazole today and currently symptom stable  Electrolyte abnormality Monitor bmp. Encourage hydration  Depression continue celexa   Family/ staff Communication: reviewed care plan with nursing supervisor   Goals of care: long term care   Labs/tests ordered: cbc, cmp, tsh next lab    Oneal Grout, MD  [facility] [phone] (Monday-Friday 8 am - 5 pm) [phone] (afterhours)

## 2014-05-03 DIAGNOSIS — B370 Candidal stomatitis: Secondary | ICD-10-CM | POA: Insufficient documentation

## 2014-05-03 DIAGNOSIS — R531 Weakness: Secondary | ICD-10-CM | POA: Insufficient documentation

## 2014-05-03 DIAGNOSIS — L89309 Pressure ulcer of unspecified buttock, unspecified stage: Secondary | ICD-10-CM | POA: Insufficient documentation

## 2014-05-04 ENCOUNTER — Encounter: Payer: Self-pay | Admitting: Internal Medicine

## 2014-05-04 DIAGNOSIS — I130 Hypertensive heart and chronic kidney disease with heart failure and stage 1 through stage 4 chronic kidney disease, or unspecified chronic kidney disease: Secondary | ICD-10-CM | POA: Insufficient documentation

## 2014-05-04 DIAGNOSIS — I509 Heart failure, unspecified: Secondary | ICD-10-CM | POA: Insufficient documentation

## 2014-05-04 DIAGNOSIS — N039 Chronic nephritic syndrome with unspecified morphologic changes: Secondary | ICD-10-CM

## 2014-05-04 DIAGNOSIS — N183 Chronic kidney disease, stage 3 unspecified: Secondary | ICD-10-CM | POA: Insufficient documentation

## 2014-05-04 HISTORY — DX: Heart failure, unspecified: I50.9

## 2014-05-04 NOTE — Progress Notes (Signed)
Patient ID: [name] BeringDorothy [name], female   DOB: 02/25/1924, 80 y.o.   MRN: [id number]    Facility: [facility]  Allergies reviewed  Code status- dnr  HPI 80 y/o female patient is seen today. She has dementia, ckd, chf, htn and is at her baseline. No new concern from staff. She is on healthy shake bid. She was seen by podiatrist on 01/15/14. She is participating in restorative activities. She is in no acute distress. No new skin concern. No falls reported.   ROS-  Unable to obtain  Past Medical History  Diagnosis Date  . Renal insufficiency   . Hypertension   . A-fib   . Anxiety   . Depression   . Hypothyroidism   . Inguinal hernia     repaired 2011, right  . Osteoarthritis   . Cerebral vascular accident   . DJD (degenerative joint disease)   . Recurrent UTI   . C. difficile colitis     recurrent  . Dementia   . Anemia   . Cataract   . Asteatotic eczema   . Peripheral vascular disease   . Hyponatremia   . Malnutrition   . Sepsis(995.91)   . TIA (transient ischemic attack)   . Urinary incontinence    [facility]. [facility]  Physical exam BP 118/70  Pulse 76  Temp(Src) 97.1 F (36.2 C)  Resp 18  SpO2 98%  Constitutional: frail , in NAD Neck: Neck supple. No JVD present. No thyromegaly present.   Cardiovascular: Normal rate, regular rhythm and diminished peripheral pulses Respiratory: Effort normal and breath sounds normal. No respiratory distress. She has no wheezes.   GI: Soft. Bowel sounds are normal. She exhibits no distension. There is no tenderness.  Musculoskeletal: She exhibits no edema. Transferred to wheelchair with assistance and her wheelchair is propelled. Bilateral foot eversion, has left 2nd toe with hammer toe and bunion. Is able to move extremities   Neurological: She is alert.   Skin: Skin is warm and dry.   Labs reviewed- 07/09/13 wbc 4, hb 10.5, hct 34.9, plt 239, na 137, k 4.5, co2 29, bun 33, cr 1.1,  glu 88, ca 9.6. T.chol 97, tg 35, hdl 37, ldl 53 08/15/13 wbc 6.9, hb 10.5, hct 35.1, plt 242 10/31/13 na 140, k 4.5, bun 28, cr 1, ca 8.9 11/18/13 wbc 4.2, hb 9.8, hct 33, mcv 81.1, plt 311 12/23/13 wbc 4.9, hb 10.8, hct 36.2, plt 196, na 137, k 4.2, bun 21, cr 0.7, glu 70 12/26/13 tsh 1.009  Assessment/plan  Hypothyroidism Stable, continue levothyroxine 200 mcg daily. Monitor tsh annually  chf Stabe. Continue lasix 60 mg bid. Monitor clinically, currently euvolemic  Hypertensive heart and kidney disease Stable. Continue her diltiazem with lasix and kcl supplement. Monitor vitals  ckd stage 3 On lasix, monitor renal function, avoid NSAIDs.

## 2014-06-05 ENCOUNTER — Non-Acute Institutional Stay (SKILLED_NURSING_FACILITY): Payer: PRIVATE HEALTH INSURANCE | Admitting: Internal Medicine

## 2014-06-05 DIAGNOSIS — F329 Major depressive disorder, single episode, unspecified: Secondary | ICD-10-CM

## 2014-06-05 DIAGNOSIS — R1310 Dysphagia, unspecified: Secondary | ICD-10-CM | POA: Insufficient documentation

## 2014-06-05 DIAGNOSIS — R569 Unspecified convulsions: Secondary | ICD-10-CM

## 2014-06-05 DIAGNOSIS — F32A Depression, unspecified: Secondary | ICD-10-CM

## 2014-06-05 DIAGNOSIS — K219 Gastro-esophageal reflux disease without esophagitis: Secondary | ICD-10-CM

## 2014-06-05 DIAGNOSIS — R1319 Other dysphagia: Secondary | ICD-10-CM

## 2014-06-05 DIAGNOSIS — F3289 Other specified depressive episodes: Secondary | ICD-10-CM

## 2014-06-05 NOTE — Progress Notes (Signed)
Patient ID: [name] [name], female   DOB: [birth year], 80 y.o.   MRN: [id number]    Facility[facility]  Chief complaint- routine follow up visit  Allergies reviewed  HPI Pt seen for RV/ she has dementia, afib on anticoagulation and has OA, constipation, thyroid problem. Pain is under control. No new concerns  ROS Limited due to memory loss No chest pain or dyspnea No falls No nausea or vomiting No heartburn  Past Medical History  Diagnosis Date  . Renal insufficiency   . Hypertension   . A-fib   . Anxiety   . Depression   . Hypothyroidism   . Inguinal hernia     repaired 2011, right  . Osteoarthritis   . Cerebral vascular accident   . DJD (degenerative joint disease)   . Recurrent UTI   . C. difficile colitis     recurrent  . Dementia   . Anemia   . Cataract   . Asteatotic eczema   . Peripheral vascular disease   . Hyponatremia   . Malnutrition   . Sepsis(995.91)   . TIA (transient ischemic attack)   . Urinary incontinence   . Congestive heart failure, unspecified 05/04/2014   [facility]. [facility]  Physical exam BP 115/68  Pulse 68  Temp(Src) 98.2 F (36.8 C)  Resp 19  Constitutional: frail , in NAD Cardiovascular: Normal rate, regular rhythm and diminished peripheral pulses Respiratory: Effort normal and breath sounds normal. No respiratory distress. She has no wheezes.   GI: Soft. Bowel sounds are normal. She exhibits no distension. There is no tenderness.  Musculoskeletal: She exhibits no edema. Transferred to wheelchair with assistance    Neurological: She is alert.   Skin: Skin is warm and dry.   Labs reviewed- 07/09/13 wbc 4, hb 10.5, hct 34.9, plt 239, na 137, k 4.5, co2 29, bun 33, cr 1.1, glu 88, ca 9.6. T.chol 97, tg 35, hdl 37, ldl 53 08/15/13 wbc 6.9, hb 10.5, hct 35.1, plt 242 10/31/13 na 140, k 4.5, bun 28, cr 1, ca 8.9 11/18/13 wbc 4.2, hb 9.8, hct 33, mcv 81.1, plt  311   Assessment/plan  Generalized OA continue tylenol 500 mg twice daily and  vicodin 5/325 mg 2 tabs in the am and every 8 hours as needed and will continue to monitor her status. Continue ca-vit d supplement  Constipation Continue dulcolax suppository prn and daily miralax  b12 def Continue b12 supplement

## 2014-06-05 NOTE — Progress Notes (Signed)
Patient ID: [name] BeringDorothy [name], female   DOB: 08-Mar-1924, 80 y.o.   MRN: [id number]    Facility[facility]  Chief complaint- routine follow up visit  Allergies reviewed  HPI:   80 y/o female patient is seen for routine visit. No new concerns from staff. No new seizure activity reported. She has dementia at baseline making hx taking and ros difficult. No falls reported.   Review of Systems:  Pleasantly confused. In no distress. Unable to obtain ROS fully. Denies chest pain or SOB. No pain complained of  Past Medical History  Diagnosis Date  . Renal insufficiency   . Hypertension   . A-fib   . Anxiety   . Depression   . Hypothyroidism   . Inguinal hernia     repaired 2011, right  . Osteoarthritis   . Cerebral vascular accident   . DJD (degenerative joint disease)   . Recurrent UTI   . C. difficile colitis     recurrent  . Dementia   . Anemia   . Cataract   . Asteatotic eczema   . Peripheral vascular disease   . Hyponatremia   . Malnutrition   . Sepsis(995.91)   . TIA (transient ischemic attack)   . Urinary incontinence   . Congestive heart failure, unspecified 05/04/2014   Current Outpatient Prescriptions on File Prior to Visit  Medication Sig Dispense Refill  . acetaminophen (TYLENOL) 500 MG tablet Take 500 mg by mouth 2 (two) times daily.      Marland Kitchen. aspirin 81 MG tablet Take 1 tablet (81 mg total) by mouth daily.      . bisacodyl (DULCOLAX) 10 MG suppository Place 10 mg rectally See admin instructions. Every 3 days as needed for no bowl movement      . citalopram (CELEXA) 20 MG tablet Take 20 mg by mouth daily.        Marland Kitchen. diltiazem (CARDIZEM) 60 MG tablet Take 1 tablet (60 mg total) by mouth every 6 (six) hours.  120 tablet  0  . fluconazole (DIFLUCAN) 100 MG tablet Take 1 tablet (100 mg total) by mouth daily.  5 tablet  0  . fluticasone (FLONASE) 50 MCG/ACT nasal spray Place 1 spray into the nose at bedtime.       Marland Kitchen.  HYDROcodone-acetaminophen (NORCO/VICODIN) 5-325 MG per tablet Take 1 tablet by mouth every 6 (six) hours as needed for moderate pain. Take 1 tablet by mouth every 6 hours as needed for pain *DO NO EXCEED 4 GM OF TYLENOL IN 24 HOURS*  120 tablet  0  . hydroxypropyl methylcellulose (ISOPTO TEARS) 2.5 % ophthalmic solution Place 1 drop into both eyes 4 (four) times daily as needed.      Marland Kitchen. ipratropium-albuterol (DUONEB) 0.5-2.5 (3) MG/3ML SOLN Take 3 mLs by nebulization every 6 (six) hours as needed (for congestion).      . iron polysaccharides (NIFEREX) 150 MG capsule Take 150 mg by mouth 2 (two) times daily.      Marland Kitchen. lacosamide (VIMPAT) 50 MG TABS tablet Take one tablet by mouth twice daily for seizures  60 tablet  0  . levothyroxine (SYNTHROID, LEVOTHROID) 200 MCG tablet Take 200 mcg by mouth daily before breakfast.      . metroNIDAZOLE (FLAGYL) 500 MG tablet Take 1 tablet (500 mg total) by mouth every 8 (eight) hours.  45 tablet  0  . Multiple Vitamins-Minerals (PRESERVISION AREDS 2 PO) Take 1 capsule by mouth 2 (two) times daily.      .Marland Kitchen  potassium chloride SA (K-DUR,KLOR-CON) 20 MEQ tablet Take 20 mEq by mouth daily.      . Protein (PROCEL) POWD Take 1 scoop by mouth 3 (three) times daily.      . sennosides-docusate sodium (SENOKOT-S) 8.6-50 MG tablet Take 1 tablet by mouth 2 (two) times daily.      . vitamin B-12 (CYANOCOBALAMIN) 1000 MCG tablet Take 1,000 mcg by mouth daily.       No current facility-administered medications on file prior to visit.   Physical exam BP 144/62  Pulse 89  Temp(Src) 97.9 F (36.6 C)  Resp 18  Constitutional: frail , in NAD Neck: Neck supple. No JVD present. No thyromegaly present.   Cardiovascular: Normal rate, regular rhythm and diminished peripheral pulses Respiratory: Effort normal and breath sounds normal. No respiratory distress. She has no wheezes.   GI: Soft. Bowel sounds are normal. She exhibits no distension. There is no tenderness.  Musculoskeletal:  She exhibits no edema. Is able to move extremities and on [facility] Neurological: She is alert.   Skin: Skin is warm and dry. Has pressure ulcer in right buttock  Assessment/plan  Assessment/Plan  Dysphagia Continue nectar thick feed, aspiration precautions, assistance with feed  Depression Tolerating decreased dose of celexa of 10 mg daily well. Continue this  Seizures Continue vimpat 50 mg bid for now. Seizure precautions  GERD Controlled with prilosec 20 mg daily

## 2014-06-05 NOTE — Progress Notes (Signed)
Patient ID: [name] BeringDorothy [name], female   DOB: [birth year], 80 y.o.   MRN: [id number]    [facility]  Chief complaint- routine follow up visit  Allergies reviewed  Code status- dnr  HPI 80 y/o female patient is seen today. She has dementia and is at her baseline. She is in no acute distress. No new skin concern. No falls reported.   ROS-  Unable to obtain  Past Medical History  Diagnosis Date  . Renal insufficiency   . Hypertension   . A-fib   . Anxiety   . Depression   . Hypothyroidism   . Inguinal hernia     repaired 2011, right  . Osteoarthritis   . Cerebral vascular accident   . DJD (degenerative joint disease)   . Recurrent UTI   . C. difficile colitis     recurrent  . Dementia   . Anemia   . Cataract   . Asteatotic eczema   . Peripheral vascular disease   . Hyponatremia   . Malnutrition   . Sepsis(995.91)   . TIA (transient ischemic attack)   . Urinary incontinence   . Congestive heart failure, unspecified 05/04/2014   Medication reviewed. See MAR  Physical exam Vss, afebrile  Constitutional: frail , in NAD Cardiovascular: Normal rate, regular rhythm and diminished peripheral pulses Respiratory: Effort normal and breath sounds normal. No respiratory distress. She has no wheezes.   GI: Soft. Bowel sounds are normal. She exhibits no distension. There is no tenderness.  Musculoskeletal: She exhibits no edema. Transferred to wheelchair with assistance and her wheelchair is propelled.    Neurological: She is alert.   Skin: Skin is warm and dry.   Labs reviewed- 10/31/13 na 140, k 4.5, bun 28, cr 1, ca 8.9 11/18/13 wbc 4.2, hb 9.8, hct 33, mcv 81.1, plt 311   Assessment/plan  Atrial fibrillation Continue diltiazem 120 mg CD daily for rate control and continue xarelto 15 mg daily for secondary stroke prophylaxis  depression continue celexa 20 mg daily will monitor   HYPOTHYROIDISM Reviewed tsh level. On  levothyroxine to 200 mcg daily.

## 2014-07-14 ENCOUNTER — Non-Acute Institutional Stay (SKILLED_NURSING_FACILITY): Payer: PRIVATE HEALTH INSURANCE | Admitting: Internal Medicine

## 2014-07-14 DIAGNOSIS — I4891 Unspecified atrial fibrillation: Secondary | ICD-10-CM

## 2014-07-14 DIAGNOSIS — I482 Chronic atrial fibrillation, unspecified: Secondary | ICD-10-CM

## 2014-07-14 DIAGNOSIS — E43 Unspecified severe protein-calorie malnutrition: Secondary | ICD-10-CM

## 2014-07-14 DIAGNOSIS — K219 Gastro-esophageal reflux disease without esophagitis: Secondary | ICD-10-CM

## 2014-07-22 NOTE — Progress Notes (Signed)
Patient ID: [name] BeringDorothy [name], female   DOB: [birth year], 80 y.o.   MRN: [id number]    [facility]  Chief complaint- routine follow up visit  Allergies reviewed  HPI:   80 y/o female patient is seen for routine visit. Weight has been stable. Remains seizure free. She has dementia at baseline making hx taking and ros difficult. No falls reported. Denies any concerns  Review of Systems:  Constitutional: Negative for fever, chills, diaphoresis. Marland Kitchen.  Respiratory: Negative for cough, shortness of breath and wheezing.   Cardiovascular: Negative for chest pain, palpitations Gastrointestinal: Negative for heartburn, nausea, vomiting, abdominal pain Genitourinary: Negative for dysuria  [facility]. [facility]  Physical exam BP 135/80  Pulse 86  Temp(Src) 98 F (36.7 C)  Resp 18  SpO2 95%  Constitutional: frail , in NAD Neck: Neck supple. No JVD present. No thyromegaly present.   Cardiovascular: Normal rate, regular rhythm and diminished peripheral pulses Respiratory: Effort normal and breath sounds normal. No respiratory distress. She has no wheezes.   GI: Soft. Bowel sounds are normal. She exhibits no distension. There is no tenderness.  Musculoskeletal: She exhibits no edema. Is able to move extremities. On wheelchair Neurological: She is alert.   Skin: Skin is warm and dry. Has pressure ulcer in right buttock, easy bruising noted  Labs- 04/30/14 wbc 5.9, hb 12, hct 39.9, plt 262, na 140, k 4.2, bun 18, cr 0.9, glu 77  Assessment/plan  afib- rate controlled. Continue cardizem and aspirin  gerd- continue prilosec for now  Protein calorie malnutrition- decline anticipated with her dementia. Continue remeron, monitor weight, encourage po intake, continue supplements, skin care, fall precautions

## 2014-08-11 ENCOUNTER — Emergency Department (HOSPITAL_COMMUNITY): Payer: PRIVATE HEALTH INSURANCE

## 2014-08-11 ENCOUNTER — Encounter (HOSPITAL_COMMUNITY): Payer: Self-pay | Admitting: Emergency Medicine

## 2014-08-11 ENCOUNTER — Inpatient Hospital Stay (HOSPITAL_COMMUNITY)
Admission: EM | Admit: 2014-08-11 | Discharge: 2014-08-15 | DRG: 640 | Disposition: A | Discharge status: Skilled Nursing Facility | Payer: PRIVATE HEALTH INSURANCE | Attending: Family Medicine | Admitting: Family Medicine

## 2014-08-11 DIAGNOSIS — R4182 Altered mental status, unspecified: Secondary | ICD-10-CM | POA: Diagnosis not present

## 2014-08-11 DIAGNOSIS — I10 Essential (primary) hypertension: Secondary | ICD-10-CM | POA: Diagnosis present

## 2014-08-11 DIAGNOSIS — F0390 Unspecified dementia without behavioral disturbance: Secondary | ICD-10-CM | POA: Diagnosis present

## 2014-08-11 DIAGNOSIS — F03C0 Unspecified dementia, severe, without behavioral disturbance, psychotic disturbance, mood disturbance, and anxiety: Secondary | ICD-10-CM

## 2014-08-11 DIAGNOSIS — F32A Depression, unspecified: Secondary | ICD-10-CM

## 2014-08-11 DIAGNOSIS — R531 Weakness: Secondary | ICD-10-CM

## 2014-08-11 DIAGNOSIS — A0471 Enterocolitis due to Clostridium difficile, recurrent: Secondary | ICD-10-CM

## 2014-08-11 DIAGNOSIS — R627 Adult failure to thrive: Secondary | ICD-10-CM

## 2014-08-11 DIAGNOSIS — Z8673 Personal history of transient ischemic attack (TIA), and cerebral infarction without residual deficits: Secondary | ICD-10-CM

## 2014-08-11 DIAGNOSIS — R1310 Dysphagia, unspecified: Secondary | ICD-10-CM

## 2014-08-11 DIAGNOSIS — R8281 Pyuria: Secondary | ICD-10-CM

## 2014-08-11 DIAGNOSIS — E860 Dehydration: Secondary | ICD-10-CM | POA: Diagnosis not present

## 2014-08-11 DIAGNOSIS — M1990 Unspecified osteoarthritis, unspecified site: Secondary | ICD-10-CM | POA: Diagnosis present

## 2014-08-11 DIAGNOSIS — A0472 Enterocolitis due to Clostridium difficile, not specified as recurrent: Secondary | ICD-10-CM

## 2014-08-11 DIAGNOSIS — I4891 Unspecified atrial fibrillation: Secondary | ICD-10-CM

## 2014-08-11 DIAGNOSIS — M159 Polyosteoarthritis, unspecified: Secondary | ICD-10-CM

## 2014-08-11 DIAGNOSIS — Z66 Do not resuscitate: Secondary | ICD-10-CM | POA: Diagnosis present

## 2014-08-11 DIAGNOSIS — Z7982 Long term (current) use of aspirin: Secondary | ICD-10-CM | POA: Diagnosis not present

## 2014-08-11 DIAGNOSIS — Z880 Allergy status to penicillin: Secondary | ICD-10-CM | POA: Diagnosis not present

## 2014-08-11 DIAGNOSIS — E43 Unspecified severe protein-calorie malnutrition: Secondary | ICD-10-CM

## 2014-08-11 DIAGNOSIS — N179 Acute kidney failure, unspecified: Secondary | ICD-10-CM | POA: Diagnosis present

## 2014-08-11 DIAGNOSIS — R609 Edema, unspecified: Secondary | ICD-10-CM

## 2014-08-11 DIAGNOSIS — Z681 Body mass index (BMI) 19 or less, adult: Secondary | ICD-10-CM | POA: Diagnosis not present

## 2014-08-11 DIAGNOSIS — R569 Unspecified convulsions: Secondary | ICD-10-CM

## 2014-08-11 DIAGNOSIS — R739 Hyperglycemia, unspecified: Secondary | ICD-10-CM | POA: Diagnosis present

## 2014-08-11 DIAGNOSIS — E870 Hyperosmolality and hypernatremia: Secondary | ICD-10-CM | POA: Diagnosis present

## 2014-08-11 DIAGNOSIS — E876 Hypokalemia: Secondary | ICD-10-CM | POA: Diagnosis present

## 2014-08-11 DIAGNOSIS — H353190 Nonexudative age-related macular degeneration, unspecified eye, stage unspecified: Secondary | ICD-10-CM

## 2014-08-11 DIAGNOSIS — Z8701 Personal history of pneumonia (recurrent): Secondary | ICD-10-CM

## 2014-08-11 DIAGNOSIS — Z87891 Personal history of nicotine dependence: Secondary | ICD-10-CM | POA: Diagnosis not present

## 2014-08-11 DIAGNOSIS — E46 Unspecified protein-calorie malnutrition: Secondary | ICD-10-CM

## 2014-08-11 DIAGNOSIS — E039 Hypothyroidism, unspecified: Secondary | ICD-10-CM | POA: Diagnosis present

## 2014-08-11 DIAGNOSIS — N390 Urinary tract infection, site not specified: Secondary | ICD-10-CM

## 2014-08-11 DIAGNOSIS — J069 Acute upper respiratory infection, unspecified: Secondary | ICD-10-CM

## 2014-08-11 DIAGNOSIS — N183 Chronic kidney disease, stage 3 unspecified: Secondary | ICD-10-CM

## 2014-08-11 DIAGNOSIS — I160 Hypertensive urgency: Secondary | ICD-10-CM

## 2014-08-11 DIAGNOSIS — A047 Enterocolitis due to Clostridium difficile: Secondary | ICD-10-CM | POA: Diagnosis present

## 2014-08-11 DIAGNOSIS — L89153 Pressure ulcer of sacral region, stage 3: Secondary | ICD-10-CM | POA: Diagnosis present

## 2014-08-11 DIAGNOSIS — J90 Pleural effusion, not elsewhere classified: Secondary | ICD-10-CM

## 2014-08-11 DIAGNOSIS — F419 Anxiety disorder, unspecified: Secondary | ICD-10-CM | POA: Diagnosis present

## 2014-08-11 DIAGNOSIS — R64 Cachexia: Secondary | ICD-10-CM | POA: Diagnosis present

## 2014-08-11 DIAGNOSIS — M898X9 Other specified disorders of bone, unspecified site: Secondary | ICD-10-CM

## 2014-08-11 DIAGNOSIS — N178 Other acute kidney failure: Secondary | ICD-10-CM

## 2014-08-11 DIAGNOSIS — F329 Major depressive disorder, single episode, unspecified: Secondary | ICD-10-CM | POA: Diagnosis present

## 2014-08-11 DIAGNOSIS — G40901 Epilepsy, unspecified, not intractable, with status epilepticus: Secondary | ICD-10-CM

## 2014-08-11 DIAGNOSIS — N3090 Cystitis, unspecified without hematuria: Secondary | ICD-10-CM

## 2014-08-11 DIAGNOSIS — B370 Candidal stomatitis: Secondary | ICD-10-CM

## 2014-08-11 DIAGNOSIS — J690 Pneumonitis due to inhalation of food and vomit: Secondary | ICD-10-CM | POA: Diagnosis present

## 2014-08-11 DIAGNOSIS — K5641 Fecal impaction: Secondary | ICD-10-CM

## 2014-08-11 DIAGNOSIS — M47812 Spondylosis without myelopathy or radiculopathy, cervical region: Secondary | ICD-10-CM

## 2014-08-11 LAB — URINE MICROSCOPIC-ADD ON

## 2014-08-11 LAB — CBC
HCT: 45.7 % (ref 36.0–46.0)
Hemoglobin: 13.6 g/dL (ref 12.0–15.0)
MCH: 30.2 pg (ref 26.0–34.0)
MCHC: 29.8 g/dL — ABNORMAL LOW (ref 30.0–36.0)
MCV: 101.3 fL — AB (ref 78.0–100.0)
PLATELETS: 203 10*3/uL (ref 150–400)
RBC: 4.51 MIL/uL (ref 3.87–5.11)
RDW: 16.8 % — AB (ref 11.5–15.5)
WBC: 10.1 10*3/uL (ref 4.0–10.5)

## 2014-08-11 LAB — COMPREHENSIVE METABOLIC PANEL
ALBUMIN: 3.2 g/dL — AB (ref 3.5–5.2)
ALT: 24 U/L (ref 0–35)
AST: 33 U/L (ref 0–37)
Alkaline Phosphatase: 54 U/L (ref 39–117)
Anion gap: 11 (ref 5–15)
BUN: 51 mg/dL — ABNORMAL HIGH (ref 6–23)
CALCIUM: 10 mg/dL (ref 8.4–10.5)
CO2: 31 mEq/L (ref 19–32)
CREATININE: 1.42 mg/dL — AB (ref 0.50–1.10)
Chloride: 124 mEq/L — ABNORMAL HIGH (ref 96–112)
GFR calc Af Amer: 37 mL/min — ABNORMAL LOW (ref >90)
GFR calc non Af Amer: 32 mL/min — ABNORMAL LOW (ref >90)
Glucose, Bld: 171 mg/dL — ABNORMAL HIGH (ref 70–99)
Potassium: 4.6 mEq/L (ref 3.7–5.3)
Sodium: 166 mEq/L (ref 137–147)
Total Bilirubin: 0.6 mg/dL (ref 0.3–1.2)
Total Protein: 7.6 g/dL (ref 6.0–8.3)

## 2014-08-11 LAB — URINALYSIS, ROUTINE W REFLEX MICROSCOPIC
Bilirubin Urine: NEGATIVE
GLUCOSE, UA: NEGATIVE mg/dL
Hgb urine dipstick: NEGATIVE
Ketones, ur: NEGATIVE mg/dL
NITRITE: NEGATIVE
PH: 5.5 (ref 5.0–8.0)
Protein, ur: NEGATIVE mg/dL
Specific Gravity, Urine: 1.021 (ref 1.005–1.030)
Urobilinogen, UA: 0.2 mg/dL (ref 0.0–1.0)

## 2014-08-11 LAB — I-STAT CG4 LACTIC ACID, ED: Lactic Acid, Venous: 2.25 mmol/L — ABNORMAL HIGH (ref 0.5–2.2)

## 2014-08-11 MED ORDER — RESOURCE THICKENUP CLEAR PO POWD
ORAL | Status: DC | PRN
  Filled 2014-08-11: qty 125

## 2014-08-11 MED ORDER — VANCOMYCIN HCL IN DEXTROSE 750-5 MG/150ML-% IV SOLN
750.0000 mg | Freq: Once | INTRAVENOUS | Status: AC
  Administered 2014-08-11: 750 mg via INTRAVENOUS
  Filled 2014-08-11: qty 150

## 2014-08-11 MED ORDER — BISACODYL 10 MG RE SUPP: 10.0000 mg | Freq: Every day | RECTAL | Status: DC | PRN

## 2014-08-11 MED ORDER — LEVOFLOXACIN IN D5W 500 MG/100ML IV SOLN: 500.0000 mg | INTRAVENOUS | Status: DC

## 2014-08-11 MED ORDER — LEVOFLOXACIN IN D5W 750 MG/150ML IV SOLN
750.0000 mg | Freq: Once | INTRAVENOUS | Status: AC
  Administered 2014-08-11: 750 mg via INTRAVENOUS
  Filled 2014-08-11 (×2): qty 150

## 2014-08-11 MED ORDER — HYPROMELLOSE (GONIOSCOPIC) 2.5 % OP SOLN
1.0000 [drp] | Freq: Four times a day (QID) | OPHTHALMIC | Status: DC | PRN
  Filled 2014-08-11: qty 15

## 2014-08-11 MED ORDER — LACOSAMIDE 50 MG PO TABS
50.0000 mg | ORAL_TABLET | Freq: Two times a day (BID) | ORAL | Status: DC
  Administered 2014-08-11 – 2014-08-14 (×6): 50 mg via ORAL
  Filled 2014-08-11 (×8): qty 1

## 2014-08-11 MED ORDER — LEVOTHYROXINE SODIUM 100 MCG PO TABS
200.0000 ug | ORAL_TABLET | Freq: Every day | ORAL | Status: DC
  Administered 2014-08-12: 200 ug via ORAL
  Filled 2014-08-11: qty 2

## 2014-08-11 MED ORDER — VANCOMYCIN HCL IN DEXTROSE 1-5 GM/200ML-% IV SOLN: 1000.0000 mg | Freq: Once | INTRAVENOUS | Status: DC

## 2014-08-11 MED ORDER — ASPIRIN 81 MG PO CHEW
81.0000 mg | CHEWABLE_TABLET | Freq: Every morning | ORAL | Status: DC
  Administered 2014-08-12 – 2014-08-14 (×3): 81 mg via ORAL
  Filled 2014-08-11 (×4): qty 1

## 2014-08-11 MED ORDER — VANCOMYCIN HCL 500 MG IV SOLR: 500.0000 mg | INTRAVENOUS | Status: DC

## 2014-08-11 MED ORDER — DILTIAZEM HCL 25 MG/5ML IV SOLN
10.0000 mg | Freq: Once | INTRAVENOUS | Status: AC
  Administered 2014-08-11: 10 mg via INTRAVENOUS

## 2014-08-11 MED ORDER — CITALOPRAM HYDROBROMIDE 10 MG PO TABS
10.0000 mg | ORAL_TABLET | ORAL | Status: DC
  Administered 2014-08-12 – 2014-08-15 (×4): 10 mg via ORAL
  Filled 2014-08-11 (×4): qty 1

## 2014-08-11 MED ORDER — DEXTROSE 5 % IV SOLN
2.0000 g | Freq: Once | INTRAVENOUS | Status: AC
  Administered 2014-08-11: 2 g via INTRAVENOUS
  Filled 2014-08-11 (×2): qty 2

## 2014-08-11 MED ORDER — SODIUM CHLORIDE 0.9 % IJ SOLN
3.0000 mL | Freq: Two times a day (BID) | INTRAMUSCULAR | Status: DC
  Administered 2014-08-11 – 2014-08-14 (×5): 3 mL via INTRAVENOUS

## 2014-08-11 MED ORDER — SODIUM CHLORIDE 0.9 % IV BOLUS (SEPSIS)
500.0000 mL | Freq: Once | INTRAVENOUS | Status: AC
  Administered 2014-08-11: 500 mL via INTRAVENOUS

## 2014-08-11 MED ORDER — IPRATROPIUM-ALBUTEROL 0.5-2.5 (3) MG/3ML IN SOLN: 3.0000 mL | Freq: Four times a day (QID) | RESPIRATORY_TRACT | Status: DC | PRN

## 2014-08-11 MED ORDER — SODIUM CHLORIDE 0.9 % IV BOLUS (SEPSIS)
1000.0000 mL | Freq: Once | INTRAVENOUS | Status: AC
  Administered 2014-08-11: 1000 mL via INTRAVENOUS

## 2014-08-11 MED ORDER — ONDANSETRON HCL 4 MG/2ML IJ SOLN: 4.0000 mg | Freq: Four times a day (QID) | INTRAMUSCULAR | Status: DC | PRN

## 2014-08-11 MED ORDER — METRONIDAZOLE IN NACL 5-0.79 MG/ML-% IV SOLN
500.0000 mg | Freq: Three times a day (TID) | INTRAVENOUS | Status: DC
  Administered 2014-08-11 – 2014-08-14 (×9): 500 mg via INTRAVENOUS
  Filled 2014-08-11 (×10): qty 100

## 2014-08-11 MED ORDER — DILTIAZEM HCL 60 MG PO TABS
60.0000 mg | ORAL_TABLET | Freq: Four times a day (QID) | ORAL | Status: AC
  Administered 2014-08-12 – 2014-08-14 (×10): 60 mg via ORAL
  Filled 2014-08-11 (×12): qty 1

## 2014-08-11 MED ORDER — DILTIAZEM HCL 100 MG IV SOLR
5.0000 mg/h | Freq: Once | INTRAVENOUS | Status: AC
  Administered 2014-08-11: 5 mg/h via INTRAVENOUS

## 2014-08-11 MED ORDER — DEXTROSE 5 % IV SOLN
10.0000 mg/h | Freq: Once | INTRAVENOUS | Status: DC
  Filled 2014-08-11: qty 100

## 2014-08-11 MED ORDER — ENOXAPARIN SODIUM 30 MG/0.3ML ~~LOC~~ SOLN
30.0000 mg | SUBCUTANEOUS | Status: DC
  Administered 2014-08-11 – 2014-08-14 (×4): 30 mg via SUBCUTANEOUS
  Filled 2014-08-11 (×4): qty 0.3

## 2014-08-11 MED ORDER — SODIUM CHLORIDE 0.9 % IV BOLUS (SEPSIS)
500.0000 mL | INTRAVENOUS | Status: AC
  Administered 2014-08-11: 500 mL via INTRAVENOUS

## 2014-08-11 MED ORDER — ONDANSETRON HCL 4 MG PO TABS: 4.0000 mg | ORAL_TABLET | Freq: Four times a day (QID) | ORAL | Status: DC | PRN

## 2014-08-11 MED ORDER — DEXTROSE 5 % IV SOLN
INTRAVENOUS | Status: DC
  Administered 2014-08-11 – 2014-08-12 (×2): via INTRAVENOUS

## 2014-08-11 MED ORDER — DEXTROSE 5 % IV SOLN
500.0000 mg | Freq: Three times a day (TID) | INTRAVENOUS | Status: DC
  Filled 2014-08-11 (×2): qty 0.5

## 2014-08-11 NOTE — H&P (Addendum)
[facility] History and Physical  [name] [name] ZOX:[id number] DOB: [birth year] DOA: 08/11/2014  Referring physician: EDP PCP: Oneal Grout, MD   Chief Complaint: lethargy  HPI: [name] [name] is a 80 y.o. female  With end stage dementia, recent pneumonia and c diff, recurrent aspiration, atrial fibrillation, weight loss sent to ED from SNF with lethargy. Bed and chair bound. Has not been eating or able to take medications recently. Has sacral decutibus. PCP has recommended comfort care, but family declines. In ED, found to have a sodium of 166. CXR without infiltrate and ua negative. BUN and creatinine above baseline. Has lost 10 lbs over the past few weeks. Per EDP, family became very defensive and angry when she asked palliative/hospice options. In ED, mental status improved after 2 liters ns.  HR in the 150s, cardizem gtt started and now 110-120   Review of Systems:  Unable due to patient factors  Past Medical History  Diagnosis Date  . Renal insufficiency   . Hypertension   . A-fib   . Anxiety   . Depression   . Hypothyroidism   . Inguinal hernia     repaired 2011, right  . Osteoarthritis   . Cerebral vascular accident   . DJD (degenerative joint disease)   . Recurrent UTI   . C. difficile colitis     recurrent  . Dementia   . Anemia   . Cataract   . Asteatotic eczema   . Peripheral vascular disease   . Hyponatremia   . Malnutrition   . Sepsis(995.91)   . TIA (transient ischemic attack)   . Urinary incontinence   . Congestive heart failure, unspecified 05/04/2014   Past Surgical History  Procedure Laterality Date  . Inguinal hernia repair    . Bladder surgery      prolapsed bladder  . Cataract extraction      bilateral   Social History:  reports that she quit smoking about 53 years ago. Her smoking use included Cigarettes. She smoked 0.00 packs per day. She has never used smokeless tobacco. She reports that she does not drink alcohol or use illicit  drugs.  Allergies  Allergen Reactions  . Penicillins     "red faced" and on MAR    Family History  Problem Relation Age of Onset  . Kidney Stones Mother   . Colon cancer Neg Hx      Prior to Admission medications   Medication Sig Start Date End Date Taking? Authorizing Provider  acetaminophen (TYLENOL) 500 MG tablet Take 500 mg by mouth 2 (two) times daily.   Yes Historical Provider, MD  aspirin 81 MG chewable tablet Chew 81 mg by mouth every morning.   Yes Historical Provider, MD  bisacodyl (DULCOLAX) 10 MG suppository Place 10 mg rectally See admin instructions. Every 3 days as needed for no bowl movement   Yes Historical Provider, MD  citalopram (CELEXA) 10 MG tablet Take 10 mg by mouth every morning.   Yes Historical Provider, MD  diltiazem (CARDIZEM) 60 MG tablet Take 60 mg by mouth 4 (four) times daily.   Yes Historical Provider, MD  fluticasone (FLONASE) 50 MCG/ACT nasal spray Place 1 spray into the nose at bedtime.    Yes Historical Provider, MD  furosemide (LASIX) 40 MG tablet Take 40 mg by mouth every morning.   Yes Historical Provider, MD  HYDROcodone-acetaminophen (NORCO/VICODIN) 5-325 MG per tablet Take 1 tablet by mouth every 6 (six) hours as needed for moderate pain.   Yes Historical  Provider, MD  hydroxypropyl methylcellulose (ISOPTO TEARS) 2.5 % ophthalmic solution Place 1 drop into both eyes 4 (four) times daily as needed.   Yes Historical Provider, MD  ipratropium-albuterol (DUONEB) 0.5-2.5 (3) MG/3ML SOLN Take 3 mLs by nebulization every 6 (six) hours as needed (for congestion).   Yes Historical Provider, MD  iron polysaccharides (NIFEREX) 150 MG capsule Take 150 mg by mouth 2 (two) times daily.   Yes Historical Provider, MD  lacosamide (VIMPAT) 50 MG TABS tablet Take 50 mg by mouth 2 (two) times daily.   Yes Historical Provider, MD  levothyroxine (SYNTHROID, LEVOTHROID) 200 MCG tablet Take 200 mcg by mouth daily before breakfast.   Yes Historical Provider, MD    mirtazapine (REMERON) 7.5 MG tablet Take 7.5 mg by mouth at bedtime.   Yes Historical Provider, MD  Multiple Vitamins-Minerals (CERTAGEN PO) Take 1 tablet by mouth every morning.   Yes Historical Provider, MD  Protein (PROCEL) POWD Take 1 scoop by mouth 3 (three) times daily.   Yes Historical Provider, MD  sennosides-docusate sodium (SENOKOT-S) 8.6-50 MG tablet Take 1 tablet by mouth 2 (two) times daily.   Yes Historical Provider, MD  vitamin B-12 (CYANOCOBALAMIN) 1000 MCG tablet Take 1,000 mcg by mouth daily.   Yes Historical Provider, MD   Physical Exam: Filed Vitals:   08/11/14 1700 08/11/14 1733 08/11/14 1800 08/11/14 1815  BP: 110/71  103/73 104/73  Pulse: 71 71 62 65  Temp:      TempSrc:      Resp: 24 25 25 27   Height:      Weight:      SpO2: 100% 96% 99% 98%    Wt Readings from Last 3 Encounters:  08/11/14 39.917 kg (88 lb)  04/23/14 44.2 kg (97 lb 7.1 oz)  06/11/13 46.176 kg (101 lb 12.8 oz)  BP 102/71  Pulse 105  Temp(Src) 97.4 F (36.3 C) (Axillary)  Resp 24  Ht 5\' 6"  (1.676 m)  Wt 39.917 kg (88 lb)  BMI 14.21 kg/m2  SpO2 100%  General Appearance:    Alert, cachectic chronically ill appearing. Able to answer questions with one word answers  Head:    Normocephalic, without obvious abnormality, atraumatic  Eyes:    PERRL, conjunctiva/corneas clear, EOM's intact,  Nose:   Nares normal, septum midline, mucosa normal, no drainage    or sinus tenderness  Throat:   Edentulous. Dry mucous membranes  Neck:   Supple, symmetrical, trachea midline, no adenopathy;    thyroid:  no enlargement/tenderness/nodules; no carotid   bruit or JVD  Back:     Symmetric, no curvature, ROM normal, no CVA tenderness  Lungs:     Clear to auscultation bilaterally, respirations unlabored  Chest Wall:    No tenderness or deformity   Heart:    Regular rate and rhythm, S1 and S2 normal, no murmur, rub   or gallop  Abdomen:     Soft, non-tender, bowel sounds active scaphoid  Genitalia:    deferred  Rectal:   deferred  Extremities:   Extremities normal, atraumatic, no cyanosis or edema. Severe muscle wasting  Pulses:   2+ and symmetric all extremities  Skin:   Stage 3 sacral decubitus  Lymph nodes:   Cervical, supraclavicular, and axillary nodes normal  Neurologic:   CNII-XII intact, flexion contractures          Labs on Admission:  Basic Metabolic Panel:  Recent Labs Lab 08/11/14 1232  NA 166*  K 4.6  CL 124*  CO2 31  GLUCOSE 171*  BUN 51*  CREATININE 1.42*  CALCIUM 10.0   Liver Function Tests:  Recent Labs Lab 08/11/14 1232  AST 33  ALT 24  ALKPHOS 54  BILITOT 0.6  PROT 7.6  ALBUMIN 3.2*   No results found for this basename: LIPASE, AMYLASE,  in the last 168 hours No results found for this basename: AMMONIA,  in the last 168 hours CBC:  Recent Labs Lab 08/11/14 1232  WBC 10.1  HGB 13.6  HCT 45.7  MCV 101.3*  PLT 203   Cardiac Enzymes: No results found for this basename: CKTOTAL, CKMB, CKMBINDEX, TROPONINI,  in the last 168 hours  BNP (last 3 results) No results found for this basename: PROBNP,  in the last 8760 hours CBG: No results found for this basename: GLUCAP,  in the last 168 hours  Radiological Exams on Admission: Ct Head Wo Contrast  08/11/2014   CLINICAL DATA:  Altered mental status, confusion, history dementia, currently being treated for C difficile colitis and pneumonia ; personal history hypertension, renal insufficiency, TIA, CHF  EXAM: CT HEAD WITHOUT CONTRAST  TECHNIQUE: Contiguous axial images were obtained from the base of the skull through the vertex without intravenous contrast.  COMPARISON:  04/15/2014  FINDINGS: Generalized atrophy.  Stable ventricular morphology.  No midline shift or mass effect.  Extensive small vessel chronic ischemic changes of deep cerebral white matter.  Old LEFT thalamic lacunar infarct.  Small old RIGHT parietal cortical infarct.  No intracranial hemorrhage, mass lesion or evidence of acute  infarction.  No extra-axial fluid collections.  Atherosclerotic calcifications of the internal carotid arteries at the carotid siphons.  Minimal fluid in LEFT sphenoid sinus.  No acute osseous findings.  IMPRESSION: Atrophy with small vessel chronic ischemic changes of deep cerebral white matter.  Old lacunar infarcts at the RIGHT parietal region and LEFT thalamus.  No acute intracranial abnormalities.   Electronically Signed   By: Ulyses SouthwardMark  Boles M.D.   On: 08/11/2014 17:22   Dg Chest Portable 1 View  08/11/2014   CLINICAL DATA:  Altered mental status.  EXAM: PORTABLE CHEST - 1 VIEW  COMPARISON:  02/15/2012  FINDINGS: The heart is enlarged but stable. There is a chronic left pleural effusion with overlying atelectasis and pulmonary scarring. The right lung remains relatively clear. Stable tortuosity and calcification of the thoracic aorta.  IMPRESSION: Chronic left pleural effusion with overlying atelectasis and pulmonary scarring. The effusion is smaller when compared to the prior study.  The right lung appears clear.   Electronically Signed   By: Loralie ChampagneMark  Gallerani M.D.   On: 08/11/2014 13:49    EKG: Atrial fibrillation with rapid ventricular response Rightward axis Septal infarct , age undetermined  Assessment/Plan  Principal Problem:   Dehydration: has received 2 liters saline. D5W infusion due to hypernatremia.  Active Problems:   Hypothyroidism: check TSH   Adult failure to thrive: long term prognosis poor. Discussed with family who voiced understanding but do not want to make comfort care or palliative consult. Continue DNR   Enteritis due to Clostridium difficile: continue IV flagyl   Dysphagia: on D1/nectar.   Atrial fibrillation with rapid ventricular response secondary to dehydration and not taking CCB. Continue cardizem gtt and attempt po cardizem tomorrow   end stage dementia   Severe protein-calorie malnutrition   Acute renal failure secondary to prerenal azotemia Recent pneumonia:  CXR negative. Will d/c avelox given c diff infection. Stage 3 sacral decubitus: air mattress overlay. Mepilex. No sign  of cellulitis or abscess  Code Status: DNR Family Communication: son and daughter at bedside Disposition Plan: tele. Back to SNF when stable  Time spent: 60 min  Canyon Willow L [facility] Pager [phone]

## 2014-08-11 NOTE — ED Notes (Signed)
MD (consult- Hospitalist) at bedside for evaluation.

## 2014-08-11 NOTE — ED Notes (Addendum)
Upon RN arrival into room, pt was sitting up in bed drinking thickened liquids and talking intermittently.  Sometimes difficult to understand, but is able to make out intermittently what pt is attempting to say to family.  Family reports "this is the most she has talked and moved in a week."  MD Linker made aware.

## 2014-08-11 NOTE — ED Notes (Signed)
Vancomycin 750mg /13650mL IV stopped.

## 2014-08-11 NOTE — Progress Notes (Signed)
ANTIBIOTIC CONSULT NOTE - INITIAL  Pharmacy Consult for levaquin, aztreonam, vancomycin Indication: rule out sepsis  Allergies  Allergen Reactions  . Penicillins     "red faced" and on MAR    Patient Measurements: Height: 5\' 6"  (167.6 cm) Weight: 88 lb (39.917 kg) IBW/kg (Calculated) : 59.3 Adjusted Body Weight: 39.9 kg  Vital Signs: Temp: 98 F (36.7 C) (10/26 1202) Temp Source: Oral (10/26 1202) BP: 129/102 mmHg (10/26 1300) Pulse Rate: 81 (10/26 1300) Intake/Output from previous day:   Intake/Output from this shift:    Labs: No results found for this basename: WBC, HGB, PLT, LABCREA, CREATININE,  in the last 72 hours Estimated Creatinine Clearance: 30.6 ml/min (by C-G formula based on Cr of 0.76). No results found for this basename: VANCOTROUGH, VANCOPEAK, VANCORANDOM, GENTTROUGH, GENTPEAK, GENTRANDOM, TOBRATROUGH, TOBRAPEAK, TOBRARND, AMIKACINPEAK, AMIKACINTROU, AMIKACIN,  in the last 72 hours   Microbiology: No results found for this or any previous visit (from the past 720 hour(s)).  Medical History: Past Medical History  Diagnosis Date  . Renal insufficiency   . Hypertension   . A-fib   . Anxiety   . Depression   . Hypothyroidism   . Inguinal hernia     repaired 2011, right  . Osteoarthritis   . Cerebral vascular accident   . DJD (degenerative joint disease)   . Recurrent UTI   . C. difficile colitis     recurrent  . Dementia   . Anemia   . Cataract   . Asteatotic eczema   . Peripheral vascular disease   . Hyponatremia   . Malnutrition   . Sepsis(995.91)   . TIA (transient ischemic attack)   . Urinary incontinence   . Congestive heart failure, unspecified 05/04/2014   Assessment: 80 yo F in ED with altered mental status. Pharmacy consulted to dose levaquin, aztreonam and vancomycin for r/o sepsis.  PCN allergy listed as "red faced".  Wt 39.9 kg.  Pt a resident of SNF and currently being treated for c-diff and PNA.  Lactic acid elevated at 2.25.  WBC 10.1, afebrile. HR 42, RR 27. Creat 1.42. Creat cl ~ 17 ml/min.  CXR: chronic L pleural effusion  Goal of Therapy:  Vancomycin trough level 15-20 mcg/ml  Plan:  1. levaquin 750 mg IV x 1 dose, give first, then 500 mg IV q48 2. Aztreonam 2 gm IV x 1 dose, give 2nd, then 500 mg IV q8h 3. Vancomycin 750 mg IV x 1 dose, give 3rd, then 500 mg IV q48hr 4. F/u renal fxn, wbc, temp, culture data 5. Steady-state vanc trough as needed.  Herby AbrahamMichelle T. Shilynn Hoch, Pharm.D. [phone] 08/11/2014 2:48 PM

## 2014-08-11 NOTE — ED Provider Notes (Signed)
CSN: [id number]     Arrival date & time 08/11/14  1158 History   First MD Initiated Contact with Patient 08/11/14 1211     Chief Complaint  Patient presents with  . Altered Mental Status     (Consider location/radiation/quality/duration/timing/severity/associated sxs/prior Treatment) HPI  A LEVEL 5 CAVEAT APPLIES DUE TO DEMENTIA AND ALTERED MENTAL STATUS.  Pt presents from nursing home with c/o decreased mental status.  She is DNR- is being treated now for cdif with flagyl and also for aspiration pneumonia.  Family became concerned over the weekend that she was not as responsive as usual which prompted ED visit today.    Past Medical History  Diagnosis Date  . Renal insufficiency   . Hypertension   . A-fib   . Anxiety   . Depression   . Hypothyroidism   . Inguinal hernia     repaired 2011, right  . Osteoarthritis   . Cerebral vascular accident   . DJD (degenerative joint disease)   . Recurrent UTI   . C. difficile colitis     recurrent  . Dementia   . Anemia   . Cataract   . Asteatotic eczema   . Peripheral vascular disease   . Hyponatremia   . Malnutrition   . Sepsis(995.91)   . TIA (transient ischemic attack)   . Urinary incontinence   . Congestive heart failure, unspecified 05/04/2014   Past Surgical History  Procedure Laterality Date  . Inguinal hernia repair    . Bladder surgery      prolapsed bladder  . Cataract extraction      bilateral   Family History  Problem Relation Age of Onset  . Kidney Stones Mother   . Colon cancer Neg Hx    History  Substance Use Topics  . Smoking status: Former Smoker    Types: Cigarettes    Quit date: 10/17/1960  . Smokeless tobacco: Never Used  . Alcohol Use: No   OB History   Grav Para Term Preterm Abortions TAB SAB Ect Mult Living                 Review of Systems UNABLE TO OBTAIN ROS DUE TO LEVEL 5 CAVEAT    Allergies  Penicillins  Home Medications   Prior to Admission medications   Medication Sig  Start Date End Date Taking? Authorizing Provider  acetaminophen (TYLENOL) 500 MG tablet Take 500 mg by mouth 2 (two) times daily.   Yes Historical Provider, MD  aspirin 81 MG chewable tablet Chew 81 mg by mouth every morning.   Yes Historical Provider, MD  bisacodyl (DULCOLAX) 10 MG suppository Place 10 mg rectally See admin instructions. Every 3 days as needed for no bowl movement   Yes Historical Provider, MD  citalopram (CELEXA) 10 MG tablet Take 10 mg by mouth every morning.   Yes Historical Provider, MD  diltiazem (CARDIZEM) 60 MG tablet Take 60 mg by mouth 4 (four) times daily.   Yes Historical Provider, MD  fluticasone (FLONASE) 50 MCG/ACT nasal spray Place 1 spray into the nose at bedtime.    Yes Historical Provider, MD  furosemide (LASIX) 40 MG tablet Take 40 mg by mouth every morning.   Yes Historical Provider, MD  HYDROcodone-acetaminophen (NORCO/VICODIN) 5-325 MG per tablet Take 1 tablet by mouth every 6 (six) hours as needed for moderate pain.   Yes Historical Provider, MD  hydroxypropyl methylcellulose (ISOPTO TEARS) 2.5 % ophthalmic solution Place 1 drop into both eyes 4 (four) times  daily as needed.   Yes Historical Provider, MD  ipratropium-albuterol (DUONEB) 0.5-2.5 (3) MG/3ML SOLN Take 3 mLs by nebulization every 6 (six) hours as needed (for congestion).   Yes Historical Provider, MD  iron polysaccharides (NIFEREX) 150 MG capsule Take 150 mg by mouth 2 (two) times daily.   Yes Historical Provider, MD  lacosamide (VIMPAT) 50 MG TABS tablet Take 50 mg by mouth 2 (two) times daily.   Yes Historical Provider, MD  levothyroxine (SYNTHROID, LEVOTHROID) 200 MCG tablet Take 200 mcg by mouth daily before breakfast.   Yes Historical Provider, MD  mirtazapine (REMERON) 7.5 MG tablet Take 7.5 mg by mouth at bedtime.   Yes Historical Provider, MD  Multiple Vitamins-Minerals (CERTAGEN PO) Take 1 tablet by mouth every morning.   Yes Historical Provider, MD  Protein (PROCEL) POWD Take 1 scoop by  mouth 3 (three) times daily.   Yes Historical Provider, MD  sennosides-docusate sodium (SENOKOT-S) 8.6-50 MG tablet Take 1 tablet by mouth 2 (two) times daily.   Yes Historical Provider, MD  vitamin B-12 (CYANOCOBALAMIN) 1000 MCG tablet Take 1,000 mcg by mouth daily.   Yes Historical Provider, MD   BP 110/70  Pulse 81  Temp(Src) 98 F (36.7 C) (Oral)  Resp 24  Ht 5\' 6"  (1.676 m)  Wt 88 lb (39.917 kg)  BMI 14.21 kg/m2  SpO2 100% Vitals reviewed Physical Exam Physical Examination: General appearance - somnolent, frail and ill appearing Mental status - not alert- responds to family members voice, unable to assess orientation Eyes - pupils equal and reactive, extraocular eye movements intact Mouth - mucous membranes dry Neck - supple, no significant adenopathy Chest - BSS, no increased respiratory effort, course rhonchi throughout which clear somewhat with coughing Heart - normal rate, regular rhythm, normal S1, S2, no murmurs, rubs, clicks or gallops Abdomen - soft, nontender, nondistended, no masses or organomegaly Neurological -somnolent, unable to assess orientation, withdraws from pain, moves all extremities, no facial droop Extremities - peripheral pulses normal, no pedal edema, no clubbing or cyanosis Skin - normal coloration, no rashes, poor skin turgor  ED Course  Procedures (including critical care time)  CRITICAL CARE Performed by: Ethelda Chick Total critical care time: 45 Critical care time was exclusive of separately billable procedures and treating other patients. Critical care was necessary to treat or prevent imminent or life-threatening deterioration. Critical care was time spent personally by me on the following activities: development of treatment plan with patient and/or surrogate as well as nursing, discussions with consultants, evaluation of patient's response to treatment, examination of patient, obtaining history from patient or surrogate, ordering and  performing treatments and interventions, ordering and review of laboratory studies, ordering and review of radiographic studies, pulse oximetry and re-evaluation of patient's condition.  5:20 PM d/w hospitalist, Dr. Lendell Caprice, pt to go to telemetry bed.   Labs Review Labs Reviewed  CBC - Abnormal; Notable for the following:    MCV 101.3 (*)    MCHC 29.8 (*)    RDW 16.8 (*)    All other components within normal limits  COMPREHENSIVE METABOLIC PANEL - Abnormal; Notable for the following:    Sodium 166 (*)    Chloride 124 (*)    Glucose, Bld 171 (*)    BUN 51 (*)    Creatinine, Ser 1.42 (*)    Albumin 3.2 (*)    GFR calc non Af Amer 32 (*)    GFR calc Af Amer 37 (*)    All other components within  normal limits  URINALYSIS, ROUTINE W REFLEX MICROSCOPIC - Abnormal; Notable for the following:    Color, Urine AMBER (*)    APPearance CLOUDY (*)    Leukocytes, UA SMALL (*)    All other components within normal limits  I-STAT CG4 LACTIC ACID, ED - Abnormal; Notable for the following:    Lactic Acid, Venous 2.25 (*)    All other components within normal limits  CULTURE, BLOOD (ROUTINE X 2)  CULTURE, BLOOD (ROUTINE X 2)  URINE CULTURE  URINE MICROSCOPIC-ADD ON    Imaging Review Dg Chest Portable 1 View  08/11/2014   CLINICAL DATA:  Altered mental status.  EXAM: PORTABLE CHEST - 1 VIEW  COMPARISON:  02/15/2012  FINDINGS: The heart is enlarged but stable. There is a chronic left pleural effusion with overlying atelectasis and pulmonary scarring. The right lung remains relatively clear. Stable tortuosity and calcification of the thoracic aorta.  IMPRESSION: Chronic left pleural effusion with overlying atelectasis and pulmonary scarring. The effusion is smaller when compared to the prior study.  The right lung appears clear.   Electronically Signed   By: Loralie ChampagneMark  Gallerani M.D.   On: 08/11/2014 13:49     EKG Interpretation   Date/Time:  Monday August 11 2014 12:09:13 EDT Ventricular Rate:   182 PR Interval:    QRS Duration: 80 QT Interval:  240 QTC Calculation: 417 R Axis:   96 Text Interpretation:  Atrial fibrillation with rapid ventricular response  Rightward axis Septal infarct , age undetermined Abnormal ECG No  significant change since last tracing Confirmed by Karma GanjaLINKER  MD, Jarom Govan  [phone]) on 08/11/2014 4:21:13 PM      MDM   Final diagnoses:  Altered mental status  Altered mental state    Family at bedside state that she is less interactive than her baseline, she has been worsening throughout the weekend- she is a DNR, however family does not want her to be palliative care only- the nursing home staff has talked about this with them and they are not in agreement with palliative care.  Due to tachypnea and tachycardia (pt is in rapid afib) level 2 code sepsis initiated.  Pt received NS bolus based on weight.  Her blood pressure has been stable, but HR remained elevated in afib, started on diltiazem drip as patient has been missing her medications over the weekend.  CXR does not show any signs of pneumonia, no UTI.  Pt is hypernatremic as well.  Will need further fluid management as an inpatient.  Awaiting head CT to ensure no acute stroke.   During ED stay patient did become more alert and was more interactive with family members at the bedside.      Ethelda ChickMartha K Linker, MD 08/11/14 [phone]

## 2014-08-11 NOTE — ED Notes (Signed)
Patient transported to CT with RN and monitor. 

## 2014-08-11 NOTE — ED Notes (Signed)
Portable chest xray at bedside.

## 2014-08-11 NOTE — ED Notes (Signed)
EMS reports they were called out to [facility] d/t pt not taking morning medications and altered LOC.  Son reports this started over the weekend.  Pt is currently being treated for C-diff and pneumonia.  Baseline orientation- talkative with baseline confusion.

## 2014-08-11 NOTE — ED Notes (Signed)
Pt returned from CT with RN.

## 2014-08-12 DIAGNOSIS — N179 Acute kidney failure, unspecified: Secondary | ICD-10-CM

## 2014-08-12 DIAGNOSIS — E870 Hyperosmolality and hypernatremia: Secondary | ICD-10-CM

## 2014-08-12 LAB — URINE CULTURE
Colony Count: NO GROWTH
Culture: NO GROWTH

## 2014-08-12 LAB — BASIC METABOLIC PANEL
Anion gap: 11 (ref 5–15)
BUN: 27 mg/dL — ABNORMAL HIGH (ref 6–23)
CALCIUM: 6.3 mg/dL — AB (ref 8.4–10.5)
CO2: 21 mEq/L (ref 19–32)
Chloride: 111 mEq/L (ref 96–112)
Creatinine, Ser: 0.73 mg/dL (ref 0.50–1.10)
GFR calc Af Amer: 86 mL/min — ABNORMAL LOW (ref >90)
GFR, EST NON AFRICAN AMERICAN: 74 mL/min — AB (ref >90)
Glucose, Bld: 544 mg/dL — ABNORMAL HIGH (ref 70–99)
Potassium: 2.7 mEq/L — CL (ref 3.7–5.3)
Sodium: 143 mEq/L (ref 137–147)

## 2014-08-12 LAB — CBC
HCT: 31.5 % — ABNORMAL LOW (ref 36.0–46.0)
Hemoglobin: 9.1 g/dL — ABNORMAL LOW (ref 12.0–15.0)
MCH: 29.3 pg (ref 26.0–34.0)
MCHC: 28.9 g/dL — AB (ref 30.0–36.0)
MCV: 101.3 fL — AB (ref 78.0–100.0)
Platelets: 128 10*3/uL — ABNORMAL LOW (ref 150–400)
RBC: 3.11 MIL/uL — ABNORMAL LOW (ref 3.87–5.11)
RDW: 16.6 % — AB (ref 11.5–15.5)
WBC: 7.4 10*3/uL (ref 4.0–10.5)

## 2014-08-12 LAB — HEMOGLOBIN A1C
HEMOGLOBIN A1C: 5.2 % (ref <5.7)
Mean Plasma Glucose: 103 mg/dL (ref <117)

## 2014-08-12 LAB — GLUCOSE, CAPILLARY
GLUCOSE-CAPILLARY: 102 mg/dL — AB (ref 70–99)
GLUCOSE-CAPILLARY: 92 mg/dL (ref 70–99)
Glucose-Capillary: 99 mg/dL (ref 70–99)
Glucose-Capillary: 110 mg/dL — ABNORMAL HIGH (ref 70–99)

## 2014-08-12 LAB — MRSA PCR SCREENING: MRSA by PCR: NEGATIVE

## 2014-08-12 LAB — TSH: TSH: 0.336 u[IU]/mL — ABNORMAL LOW (ref 0.350–4.500)

## 2014-08-12 LAB — CLOSTRIDIUM DIFFICILE BY PCR: Toxigenic C. Difficile by PCR: POSITIVE — AB

## 2014-08-12 MED ORDER — LEVOTHYROXINE SODIUM 75 MCG PO TABS
175.0000 ug | ORAL_TABLET | Freq: Every day | ORAL | Status: DC
  Administered 2014-08-13 – 2014-08-15 (×3): 175 ug via ORAL
  Filled 2014-08-12 (×6): qty 1

## 2014-08-12 MED ORDER — MEGESTROL ACETATE 40 MG/ML PO SUSP
200.0000 mg | Freq: Every day | ORAL | Status: DC
  Administered 2014-08-12 – 2014-08-14 (×3): 200 mg via ORAL
  Filled 2014-08-12 (×5): qty 5

## 2014-08-12 MED ORDER — POTASSIUM CHLORIDE 20 MEQ/15ML (10%) PO LIQD
40.0000 meq | ORAL | Status: DC
  Administered 2014-08-12: 40 meq via ORAL
  Filled 2014-08-12: qty 30

## 2014-08-12 MED ORDER — POTASSIUM CHLORIDE 10 MEQ/100ML IV SOLN
10.0000 meq | INTRAVENOUS | Status: AC
  Administered 2014-08-12 (×4): 10 meq via INTRAVENOUS
  Filled 2014-08-12 (×3): qty 100

## 2014-08-12 MED ORDER — INSULIN ASPART 100 UNIT/ML ~~LOC~~ SOLN
0.0000 [IU] | Freq: Three times a day (TID) | SUBCUTANEOUS | Status: DC
  Administered 2014-08-13: 1 [IU] via SUBCUTANEOUS
  Administered 2014-08-14: 2 [IU] via SUBCUTANEOUS

## 2014-08-12 MED ORDER — SODIUM CHLORIDE 0.9 % IV SOLN
INTRAVENOUS | Status: DC
  Administered 2014-08-12 – 2014-08-13 (×2): via INTRAVENOUS

## 2014-08-12 MED ORDER — VANCOMYCIN 50 MG/ML ORAL SOLUTION
125.0000 mg | Freq: Four times a day (QID) | ORAL | Status: DC
  Administered 2014-08-12 – 2014-08-14 (×11): 125 mg via ORAL
  Filled 2014-08-12 (×16): qty 2.5

## 2014-08-12 MED ORDER — POTASSIUM CHLORIDE 10 MEQ/100ML IV SOLN
10.0000 meq | INTRAVENOUS | Status: AC
  Administered 2014-08-12 (×2): 10 meq via INTRAVENOUS
  Filled 2014-08-12: qty 100

## 2014-08-12 MED ORDER — ENSURE PUDDING PO PUDG
1.0000 | Freq: Three times a day (TID) | ORAL | Status: DC
  Administered 2014-08-12 – 2014-08-14 (×8): 1 via ORAL

## 2014-08-12 NOTE — Evaluation (Signed)
Physical Therapy Evaluation Patient Details Name: [name] [name] MRN: [id number] DOB: 1924-04-28 Today's Date: 08/12/2014   History of Present Illness  Pt is an 80y.o. female presenting from SNF with pneumonia, dehydration and cdiff. Hx of end stage dementia, recurrent aspiration, afib with RVR, hypothyroidism, CVA, anemia, PVD, and CHF.   Clinical Impression  Pt currently requires max assistance for bed mobility. Pt presenting with contractures of all 4 extremities. Pt would benefit from skilled PT services to reduce dependence with bed mobility. Pt unable to participate in POC or d/c planning due to hx of cognitive impairments. Recommend return to SNF at d/c.     Follow Up Recommendations SNF    Equipment Recommendations  None recommended by PT    Recommendations for Other Services       Precautions / Restrictions Precautions Precautions: Fall      Mobility  Bed Mobility Overal bed mobility: Needs Assistance Bed Mobility: Rolling Rolling: Max assist         General bed mobility comments: Pt able to use UE to hold on to rail. Complaints of dizziness with rolling to R.  Transfers                    Ambulation/Gait                Stairs            Wheelchair Mobility    Modified Rankin (Stroke Patients Only)       Balance                                             Pertinent Vitals/Pain      Home Living Family/patient expects to be discharged to:: Skilled nursing facility                      Prior Function Level of Independence: Needs assistance   Gait / Transfers Assistance Needed: per chart review, pt bed/chair bound PTA           Hand Dominance        Extremity/Trunk Assessment   Upper Extremity Assessment: LUE deficits/detail;RUE deficits/detail RUE Deficits / Details: contractures present, muscle atrophy     LUE Deficits / Details: contractures present. muscle atrophy   Lower  Extremity Assessment: RLE deficits/detail;LLE deficits/detail RLE Deficits / Details: contractures, muscle atrophy, unable to lift LE against gravity while supine LLE Deficits / Details: contractures present, muscle atrophy, unable to raise LE against gravity in supine     Communication   Communication: Expressive difficulties;HOH  Cognition Arousal/Alertness: Lethargic Behavior During Therapy: Flat affect Overall Cognitive Status: No family/caregiver present to determine baseline cognitive functioning (hx of cognitive impairments at baseline) Area of Impairment: Orientation;Awareness;Problem solving;Following commands;Memory;Attention Orientation Level: Person Current Attention Level: Sustained Memory: Decreased short-term memory Following Commands: Follows one step commands with increased time (with cueing)     Problem Solving: Slow processing;Decreased initiation;Difficulty sequencing;Requires verbal cues;Requires tactile cues General Comments: Pt oriented to her first name, but unable to tell her last name. Demonstrating preservations, continued repeating "Arita" when asked other questions. Pt able to follow 1 step commands with VC and tactile cue. Pt able maintain focus on one task for short period of time. Pt not oriented to time, place or situation.    General Comments General comments (skin integrity, edema, etc.): Upon receiving pt, pt  soiled bed. Requires total assist with hygeine. Pt demonstrates preservations when asked her name, continues saying "Peaches."     Exercises        Assessment/Plan    PT Assessment Patient needs continued PT services  PT Diagnosis Generalized weakness;Altered mental status;Other (comment) (Reduced mobility)   PT Problem List Decreased strength;Decreased range of motion;Decreased activity tolerance;Decreased balance;Decreased mobility;Decreased coordination;Decreased cognition;Decreased safety awareness;Decreased knowledge of  precautions;Decreased skin integrity  PT Treatment Interventions Functional mobility training;Therapeutic activities;Therapeutic exercise;Balance training;Patient/family education   PT Goals (Current goals can be found in the Care Plan section) Acute Rehab PT Goals Patient Stated Goal: none stated PT Goal Formulation: Patient unable to participate in goal setting Time For Goal Achievement: 08/26/14 Potential to Achieve Goals: Poor    Frequency Min 2X/week   Barriers to discharge        Co-evaluation               End of Session Equipment Utilized During Treatment: Oxygen Activity Tolerance: Patient limited by lethargy Patient left: in bed;with call bell/phone within reach;with family/visitor present Nurse Communication: Mobility status;Other (comment) (Pt hygeine )         Time: [id number] PT Time Calculation (min): 26 min   Charges:         PT G Codes:          Carlie Solorzano 08/12/2014, 3:58 PM Cathlyn ParsonsElizabeth Alberto Schoch, SPT

## 2014-08-12 NOTE — Evaluation (Signed)
Reviewed assessment and POC, will trial acute PT services with recommendation of return to SNF.  Charlotte Crumbevon Kolbie Clarkston, PT DPT  [phone]

## 2014-08-12 NOTE — Progress Notes (Signed)
UR Completed Isidora Laham Graves-Bigelow, RN,BSN [phone]  

## 2014-08-12 NOTE — Progress Notes (Addendum)
INITIAL NUTRITION ASSESSMENT  DOCUMENTATION CODES Per approved criteria  -Severe malnutrition in the context of chronic illness -Underweight   INTERVENTION:  Magic Cups TID with all meals.  Buttermilk TID with all meals.  Ensure Pudding PO QID, each supplement provides 170 kcal and 4 grams of protein  NUTRITION DIAGNOSIS: Malnutrition related to inadequate oral intake as evidenced by severe depletion of muscle and subcutaneous fat mass.   Goal: Intake to meet >90% of estimated nutrition needs.  Monitor:  PO intake, labs, weight trend.  Reason for Assessment: MST  80 y.o. female  Admitting Dx: Dehydration  ASSESSMENT: 80 y.o. female with end stage dementia, recent pneumonia and c diff, recurrent aspiration, atrial fibrillation, weight loss sent to ED from SNF with lethargy. Bed and chair bound. Has not been eating or able to take medications recently.   Spoke with daughter in room with patient. She reports that patient usually eats well, but has eaten poorly over the past few weeks due to acute illness (PNA). Was switched from thin liquids to nectar thick liquids ~3 months ago due to aspiration of thin liquids. SLP evaluation has been ordered.  Nutrition Focused Physical Exam:  Subcutaneous Fat:  Orbital Region: mild depletion Upper Arm Region: WNL Thoracic and Lumbar Region: NA  Muscle:  Temple Region: moderate depletion Clavicle Bone Region: severe depletion Clavicle and Acromion Bone Region: severe depletion Scapular Bone Region: NA Dorsal Hand: WNL Patellar Region: severe depletion Anterior Thigh Region: severe depletion Posterior Calf Region: severe depletion  Edema: none   Height: Ht Readings from Last 1 Encounters:  08/11/14 5\' 6"  (1.676 m)    Weight: Wt Readings from Last 1 Encounters:  08/12/14 93 lb 9.6 oz (42.457 kg)    Ideal Body Weight: 59.1 kg  % Ideal Body Weight: 72%  Wt Readings from Last 10 Encounters:  08/12/14 93 lb 9.6 oz  (42.457 kg)  04/23/14 97 lb 7.1 oz (44.2 kg)  06/11/13 101 lb 12.8 oz (46.176 kg)  05/20/13 103 lb 9.6 oz (46.993 kg)  08/14/12 115 lb (52.164 kg)  02/17/12 120 lb 9.6 oz (54.704 kg)  10/24/11 119 lb 7.8 oz (54.2 kg)  09/20/11 111 lb 9.6 oz (50.621 kg)  12/02/10 107 lb 3.2 oz (48.626 kg)  07/15/10 112 lb 0.8 oz (50.826 kg)    Usual Body Weight: 97 lb  % Usual Body Weight: 96% (4% weight loss in 3 months)  BMI:  Body mass index is 15.11 kg/(m^2). underweight  Estimated Nutritional Needs: Kcal: 1200-1400 Protein: 70-80 gm Fluid: 1.2-1.4 L  Skin: stage 2 pressure ulcer to sacrum  Diet Order: Dysphagia 1 with nectar thick liquids  EDUCATION NEEDS: -Education not appropriate at this time   Intake/Output Summary (Last 24 hours) at 08/12/14 1415 Last data filed at 08/12/14 0900  Gross per 24 hour  Intake    248 ml  Output      0 ml  Net    248 ml    Last BM: 10/26   Labs:   Recent Labs Lab 08/11/14 1232 08/12/14 0501  NA 166* 143  K 4.6 2.7*  CL 124* 111  CO2 31 21  BUN 51* 27*  CREATININE 1.42* 0.73  CALCIUM 10.0 6.3*  GLUCOSE 171* 544*    CBG (last 3)   Recent Labs  08/12/14 0829 08/12/14 1134  GLUCAP 99 102*    Scheduled Meds: . aspirin  81 mg Oral q morning - 10a  . citalopram  10 mg Oral BH-q7a  .  diltiazem (CARDIZEM) infusion  10 mg/hr Intravenous Once  . diltiazem  60 mg Oral QID  . enoxaparin (LOVENOX) injection  30 mg Subcutaneous Q24H  . insulin aspart  0-9 Units Subcutaneous TID WC  . lacosamide  50 mg Oral BID  . [START ON 08/13/2014] levothyroxine  175 mcg Oral QAC breakfast  . megestrol  200 mg Oral Daily  . metronidazole  500 mg Intravenous Q8H  . potassium chloride  10 mEq Intravenous Q1 Hr x 6  . sodium chloride  3 mL Intravenous Q12H  . vancomycin  125 mg Oral QID    Continuous Infusions: . sodium chloride 75 mL/hr at 08/12/14 [id number]    Past Medical History  Diagnosis Date  . Renal insufficiency   . Hypertension   .  A-fib   . Anxiety   . Depression   . Hypothyroidism   . Inguinal hernia     repaired 2011, right  . Osteoarthritis   . Cerebral vascular accident   . DJD (degenerative joint disease)   . Recurrent UTI   . C. difficile colitis     recurrent  . Dementia   . Anemia   . Cataract   . Asteatotic eczema   . Peripheral vascular disease   . Hyponatremia   . Malnutrition   . Sepsis(995.91)   . TIA (transient ischemic attack)   . Urinary incontinence   . Congestive heart failure, unspecified 05/04/2014    Past Surgical History  Procedure Laterality Date  . Inguinal hernia repair    . Bladder surgery      prolapsed bladder  . Cataract extraction      bilateral    Joaquin CourtsKimberly Harris, RD, LDN, CNSC Pager [phone] After Hours Pager [phone]

## 2014-08-12 NOTE — Progress Notes (Signed)
CRITICAL VALUE ALERT  Critical value received:Potassium 2.7    Calcium 6.3  Date of notification: 08/12/14  Time of notification: 0645  Critical value read back:Yes  Nurse who received alert:  Mickey Farberenea Eliya Geiman  MD notified (1st page):  NP K.Kirby  Time of first page: [phone]  MD notified (2nd page):NP.Donnamarie PoagK Kirby  Time of second BMWU:1324page:0656  Responding MD:NP K.Kirby Time MD responded:  0701

## 2014-08-12 NOTE — Progress Notes (Addendum)
PROGRESS NOTE  [name] [name] [id number] DOB: Nov 03, 1923 DOA: 08/11/2014 PCP: Oneal GroutPANDEY, MAHIMA, MD  HPI: With end stage dementia, recent pneumonia and c diff, recurrent aspiration, atrial fibrillation, weight loss sent to ED from SNF with lethargy. Bed and chair bound. Has not been eating or able to take medications recently. Has sacral decutibus. PCP has recommended comfort care, but family declines. In ED, found to have a sodium of 166. CXR without infiltrate and ua negative. BUN and creatinine above baseline. Has lost 10 lbs over the past few weeks. Per EDP, family became very defensive and angry when she asked palliative/hospice options. In ED, mental status improved after 2 liters ns. HR in the 150s, cardizem gtt started and now 110-120  Subjective/ 24 H Interval events - patient with dementia but alert, denies any pain/distress  Principal Problem:   Dehydration Active Problems:   Hypothyroidism   Adult failure to thrive   Enteritis due to Clostridium difficile   Dysphagia   Atrial fibrillation with rapid ventricular response   end stage dementia   Severe protein-calorie malnutrition   Acute renal failure   Assessment/Plan: Hypernatremia - likely due to dehydration in the setting of active C diff infection - family not sure whether she had diarrhea at the nursing home  C diff colitis - C diff positive, on IV Flagyl - start po Vancomycin, if she tolerates d/c Flagyl and continue po only  Acute on chronic aspiration pneumonia - recently treated at the SNF, finished antibiotics yesterday. Hold further antibiotics as may worsen #2  A fib with RVR - rate better this morning - cardizem po  - was on a NOAC (Xarelto) discontinued, daughter is thinking that that needs to be restarted.  - discussed at length high risk of bleeding in elderly even without major trauma. No history of falls as she is bedbound - family to discuss this further and will re-address  risks/benefits  Advanced dementia - palliative/hopspice discussions in the ED per notes, family not receptive  - mentioned this morning that they want everything medically done for their mother  Severe protein calorie malnutrition  AKI - improved with hydration  Stage 3 sacral decubitus ulcer - wound looks fairly good without drainage/cellulitis  Dysphagia - SLP evaluation today - chronic aspiration  Hypothyroidism - TSH low, decrease Synthroid from 200 to 175 - might have precipitated A fib w/RVR  Hyperglycemia - this morning, likely due to the D5, switch to normal saline, SSI  Hypokalemia - due to GI losses, replete IV  Diet: dysphagia, SLP evaluation pending Fluids: NS DVT Prophylaxis: Lovenox  Code Status: DNR Family Communication: d/w daughter and son bedside  Disposition Plan: inpatient, SNF when ready   Consultants:  None   Procedures:  None    Antibiotics IV Flagyl Po Vanc   Studies  Ct Head Wo Contrast 08/11/2014 Atrophy with small vessel chronic ischemic changes of deep cerebral white matter.  Old lacunar infarcts at the RIGHT parietal region and LEFT thalamus.  No acute intracranial abnormalities. Dg Chest Portable 1 View 08/11/2014 Chronic left pleural effusion with overlying atelectasis and pulmonary scarring. The effusion is smaller when compared to the prior study.  The right lung appears clear.   Objective  Filed Vitals:   08/11/14 2100 08/12/14 0500 08/12/14 1118 08/12/14 1120  BP: 103/63 125/88 111/71   Pulse: 97 109  40  Temp: 97.4 F (36.3 C) 97.4 F (36.3 C)    TempSrc: Axillary Oral    Resp:  Height:      Weight:  42.457 kg (93 lb 9.6 oz)    SpO2: 100% 100%  82%    Intake/Output Summary (Last 24 hours) at 08/12/14 1134 Last data filed at 08/11/14 1738  Gross per 24 hour  Intake      8 ml  Output      0 ml  Net      8 ml   Filed Weights   08/11/14 1202 08/11/14 1855 08/12/14 0500  Weight: 39.917 kg (88 lb) 39.917 kg  (88 lb) 42.457 kg (93 lb 9.6 oz)    Exam:  General:  NAD, very frail  Cardiovascular: irregular  Respiratory: coarse breath sounds, no wheezing  Abdomen: soft, non tender  MSK: no edema  Neuro: non focal, contracted LE  Data Reviewed: Basic Metabolic Panel:  Recent Labs Lab 08/11/14 1232 08/12/14 0501  NA 166* 143  K 4.6 2.7*  CL 124* 111  CO2 31 21  GLUCOSE 171* 544*  BUN 51* 27*  CREATININE 1.42* 0.73  CALCIUM 10.0 6.3*   Liver Function Tests:  Recent Labs Lab 08/11/14 1232  AST 33  ALT 24  ALKPHOS 54  BILITOT 0.6  PROT 7.6  ALBUMIN 3.2*   CBC:  Recent Labs Lab 08/11/14 1232 08/12/14 0501  WBC 10.1 7.4  HGB 13.6 9.1*  HCT 45.7 31.5*  MCV 101.3* 101.3*  PLT 203 128*   CBG:  Recent Labs Lab 08/12/14 0829  GLUCAP 99    Recent Results (from the past 240 hour(s))  CULTURE, BLOOD (ROUTINE X 2)     Status: None   Collection Time    08/11/14 12:51 PM      Result Value Ref Range Status   Specimen Description BLOOD RIGHT FOREARM   Final   Special Requests BOTTLES DRAWN AEROBIC AND ANAEROBIC 4CC   Final   Culture  Setup Time     Final   Value: 08/11/2014 18:16     Performed at [facility]   Culture     Final   Value:        BLOOD CULTURE RECEIVED NO GROWTH TO DATE CULTURE WILL BE HELD FOR 5 DAYS BEFORE ISSUING A FINAL NEGATIVE REPORT     Performed at [facility]   Report Status PENDING   Incomplete  CULTURE, BLOOD (ROUTINE X 2)     Status: None   Collection Time    08/11/14  1:20 PM      Result Value Ref Range Status   Specimen Description BLOOD LEFT HAND   Final   Special Requests BOTTLES DRAWN AEROBIC AND ANAEROBIC 5CC   Final   Culture  Setup Time     Final   Value: 08/11/2014 18:16     Performed at [facility]   Culture     Final   Value:        BLOOD CULTURE RECEIVED NO GROWTH TO DATE CULTURE WILL BE HELD FOR 5 DAYS BEFORE ISSUING A FINAL NEGATIVE REPORT     Performed at [facility]   Report  Status PENDING   Incomplete  MRSA PCR SCREENING     Status: None   Collection Time    08/12/14  5:28 AM      Result Value Ref Range Status   MRSA by PCR NEGATIVE  NEGATIVE Final   Comment:            The GeneXpert MRSA Assay (FDA     approved for  NASAL specimens     only), is one component of a     comprehensive MRSA colonization     surveillance program. It is not     intended to diagnose MRSA     infection nor to guide or     monitor treatment for     MRSA infections.  CLOSTRIDIUM DIFFICILE BY PCR     Status: Abnormal   Collection Time    08/12/14  8:34 AM      Result Value Ref Range Status   C difficile by pcr POSITIVE (*) NEGATIVE Final   Comment: CRITICAL RESULT CALLED TO, READ BACK BY AND VERIFIED WITH:     Quita Skye RN 10:50 08/12/14 ([email or web address])     Scheduled Meds: . aspirin  81 mg Oral q morning - 10a  . citalopram  10 mg Oral BH-q7a  . diltiazem (CARDIZEM) infusion  10 mg/hr Intravenous Once  . diltiazem  60 mg Oral QID  . enoxaparin (LOVENOX) injection  30 mg Subcutaneous Q24H  . insulin aspart  0-9 Units Subcutaneous TID WC  . lacosamide  50 mg Oral BID  . [START ON 08/13/2014] levothyroxine  175 mcg Oral QAC breakfast  . megestrol  200 mg Oral Daily  . metronidazole  500 mg Intravenous Q8H  . potassium chloride  10 mEq Intravenous Q1 Hr x 6  . sodium chloride  3 mL Intravenous Q12H  . vancomycin  125 mg Oral QID   Continuous Infusions: . sodium chloride 75 mL/hr at 08/12/14 1610    Time spent: 35 minutes  Pamella Pert, MD [facility] Pager [phone]. If 7 PM - 7 AM, please contact night-coverage at [email or web address], password [facility] 08/12/2014, 11:34 AM  LOS: 1 day

## 2014-08-12 NOTE — Care Management Note (Signed)
    Page 1 of 1   08/15/2014     11:09:32 AM CARE MANAGEMENT NOTE 08/15/2014  Patient:  [name] [name],[name] [name]   Account Number:  [id number]  Date Initiated:  08/12/2014  Documentation initiated by:  GRAVES-BIGELOW,Anira [place]  Subjective/Objective Assessment:   Pt admitted from [facility] for lethargy and found to be in Afib with RVR. Initiated on Cardizem gtt.     Action/Plan:   CSW to assist with disposition needs for SNF. No needs from CM at this time.   Anticipated DC Date:  08/14/2014   Anticipated DC Plan:  SKILLED NURSING FACILITY  In-house referral  Clinical Social Worker      DC Planning Services  CM consult      Choice offered to / List presented to:             Status of service:  Completed, signed off Medicare Important Message given?  YES (If response is "NO", the following Medicare IM given date fields will be blank) Date Medicare IM given:  08/14/2014 Medicare IM given by:  GRAVES-BIGELOW,Joseantonio Dittmar Date Additional Medicare IM given:   Additional Medicare IM given by:    Discharge Disposition:  SKILLED NURSING FACILITY  Per UR Regulation:  Reviewed for med. necessity/level of care/duration of stay  If discussed at Long Length of Stay Meetings, dates discussed:    Comments:

## 2014-08-13 LAB — CBC
HCT: 38.4 % (ref 36.0–46.0)
HEMOGLOBIN: 11.5 g/dL — AB (ref 12.0–15.0)
MCH: 29.3 pg (ref 26.0–34.0)
MCHC: 29.9 g/dL — AB (ref 30.0–36.0)
MCV: 97.7 fL (ref 78.0–100.0)
Platelets: 157 10*3/uL (ref 150–400)
RBC: 3.93 MIL/uL (ref 3.87–5.11)
RDW: 15.9 % — ABNORMAL HIGH (ref 11.5–15.5)
WBC: 9.1 10*3/uL (ref 4.0–10.5)

## 2014-08-13 LAB — BASIC METABOLIC PANEL
Anion gap: 7 (ref 5–15)
BUN: 26 mg/dL — ABNORMAL HIGH (ref 6–23)
CHLORIDE: 121 meq/L — AB (ref 96–112)
CO2: 24 mEq/L (ref 19–32)
Calcium: 9.5 mg/dL (ref 8.4–10.5)
Creatinine, Ser: 0.76 mg/dL (ref 0.50–1.10)
GFR, EST AFRICAN AMERICAN: 85 mL/min — AB (ref >90)
GFR, EST NON AFRICAN AMERICAN: 73 mL/min — AB (ref >90)
Glucose, Bld: 93 mg/dL (ref 70–99)
POTASSIUM: 4.9 meq/L (ref 3.7–5.3)
SODIUM: 152 meq/L — AB (ref 137–147)

## 2014-08-13 LAB — GLUCOSE, CAPILLARY
GLUCOSE-CAPILLARY: 103 mg/dL — AB (ref 70–99)
Glucose-Capillary: 77 mg/dL (ref 70–99)
Glucose-Capillary: 144 mg/dL — ABNORMAL HIGH (ref 70–99)
Glucose-Capillary: 109 mg/dL — ABNORMAL HIGH (ref 70–99)

## 2014-08-13 MED ORDER — CETYLPYRIDINIUM CHLORIDE 0.05 % MT LIQD
7.0000 mL | Freq: Two times a day (BID) | OROMUCOSAL | Status: DC
  Administered 2014-08-13 – 2014-08-15 (×5): 7 mL via OROMUCOSAL

## 2014-08-13 MED ORDER — DEXTROSE 5 % IV SOLN
INTRAVENOUS | Status: DC
  Administered 2014-08-13: 11:00:00 via INTRAVENOUS

## 2014-08-13 NOTE — Progress Notes (Signed)
[facility] PROGRESS NOTE  [name] BeringDorothy [name] ZOX:[id number] DOB: October 01, 1924 DOA: 08/11/2014 PCP: Oneal GroutPANDEY, MAHIMA, MD From prior PN: With end stage dementia, recent pneumonia and c diff, recurrent aspiration, atrial fibrillation, weight loss sent to ED from SNF with lethargy. Bed and chair bound. Has not been eating or able to take medications recently. Has sacral decutibus. PCP has recommended comfort care, but family declines. In ED, found to have a sodium of 166. CXR without infiltrate and ua negative. BUN and creatinine above baseline. Has lost 10 lbs over the past few weeks. Per EDP, family became very defensive and angry when she asked palliative/hospice options.   Assessment/Plan: Hypernatremia - likely due to dehydration in the setting of active C diff infection  - Will change IVF's to D5W and reassess next am.  C diff colitis  - C diff positive, continue Flagyl and Vancomycin  Acute on chronic aspiration pneumonia  - recently treated at the SNF, finished treatment course per emr.  A fib with RVR  - Continue cardizem po  - was on a NOAC (Xarelto) discontinued - On d/c will discuss whether or not family would like for this to be continued based on risks of falls and age. Currently will hold  Advanced dementia  - Pt on ensure and - Most likely contributing to poor oral intake  Severe protein calorie malnutrition  AKI - improved with hydration   Stage 3 sacral decubitus ulcer - wound looks fairly good without drainage/cellulitis   Dysphagia - SLP evaluation today  - chronic aspiration   Hypothyroidism  - TSH low, decrease Synthroid from 200 to 175  - might have precipitated A fib w/RVR   Hyperglycemia  - Will continue SSI - will not provide D5 infusion overnight.  Hypokalemia  - due to GI losses, replete IV as necessary   Code Status: DNR  Family Communication: No family at bedside Disposition Plan: inpatient, SNF when ready      Consultants:  None  Procedures:  None  Antibiotics:  Metronidazole and Vancomycin  HPI/Subjective: No new complaints. No acute issues reported overnight.  Objective: Filed Vitals:   08/13/14 1428  BP: 109/63  Pulse: 78  Temp: 97.2 F (36.2 C)  Resp:     Intake/Output Summary (Last 24 hours) at 08/13/14 1632 Last data filed at 08/13/14 1500  Gross per 24 hour  Intake   1520 ml  Output      0 ml  Net   1520 ml   Filed Weights   08/11/14 1855 08/12/14 0500 08/13/14 0616  Weight: 39.917 kg (88 lb) 42.457 kg (93 lb 9.6 oz) 42.322 kg (93 lb 4.9 oz)    Exam:   General:  Pt in nad, alert and awake  Cardiovascular: S1 and S2 present, no mrg  Respiratory: cta bl, no wheezes  Abdomen: soft, ND  Musculoskeletal: no clubbing   Data Reviewed: Basic Metabolic Panel:  Recent Labs Lab 08/11/14 1232 08/12/14 0501 08/13/14 0400  NA 166* 143 152*  K 4.6 2.7* 4.9  CL 124* 111 121*  CO2 31 21 24   GLUCOSE 171* 544* 93  BUN 51* 27* 26*  CREATININE 1.42* 0.73 0.76  CALCIUM 10.0 6.3* 9.5   Liver Function Tests:  Recent Labs Lab 08/11/14 1232  AST 33  ALT 24  ALKPHOS 54  BILITOT 0.6  PROT 7.6  ALBUMIN 3.2*   No results found for this basename: LIPASE, AMYLASE,  in the last 168 hours No results found for this basename: AMMONIA,  in the last 168 hours CBC:  Recent Labs Lab 08/11/14 1232 08/12/14 0501 08/13/14 0400  WBC 10.1 7.4 9.1  HGB 13.6 9.1* 11.5*  HCT 45.7 31.5* 38.4  MCV 101.3* 101.3* 97.7  PLT 203 128* 157   Cardiac Enzymes: No results found for this basename: CKTOTAL, CKMB, CKMBINDEX, TROPONINI,  in the last 168 hours BNP (last 3 results) No results found for this basename: PROBNP,  in the last 8760 hours CBG:  Recent Labs Lab 08/12/14 1134 08/12/14 1655 08/12/14 1957 08/13/14 0739 08/13/14 1152  GLUCAP 102* 92 110* 77 103*    Recent Results (from the past 240 hour(s))  CULTURE, BLOOD (ROUTINE X 2)     Status: None    Collection Time    08/11/14 12:51 PM      Result Value Ref Range Status   Specimen Description BLOOD RIGHT FOREARM   Final   Special Requests BOTTLES DRAWN AEROBIC AND ANAEROBIC 4CC   Final   Culture  Setup Time     Final   Value: 08/11/2014 18:16     Performed at [facility]   Culture     Final   Value:        BLOOD CULTURE RECEIVED NO GROWTH TO DATE CULTURE WILL BE HELD FOR 5 DAYS BEFORE ISSUING A FINAL NEGATIVE REPORT     Performed at [facility]   Report Status PENDING   Incomplete  CULTURE, BLOOD (ROUTINE X 2)     Status: None   Collection Time    08/11/14  1:20 PM      Result Value Ref Range Status   Specimen Description BLOOD LEFT HAND   Final   Special Requests BOTTLES DRAWN AEROBIC AND ANAEROBIC 5CC   Final   Culture  Setup Time     Final   Value: 08/11/2014 18:16     Performed at [facility]   Culture     Final   Value:        BLOOD CULTURE RECEIVED NO GROWTH TO DATE CULTURE WILL BE HELD FOR 5 DAYS BEFORE ISSUING A FINAL NEGATIVE REPORT     Performed at [facility]   Report Status PENDING   Incomplete  URINE CULTURE     Status: None   Collection Time    08/11/14  2:53 PM      Result Value Ref Range Status   Specimen Description URINE, CATHETERIZED   Final   Special Requests NONE   Final   Culture  Setup Time     Final   Value: 08/10/2014 16:35     Performed at [facility] Count     Final   Value: NO GROWTH     Performed at [facility]   Culture     Final   Value: NO GROWTH     Performed at [facility]   Report Status 08/12/2014 FINAL   Final  MRSA PCR SCREENING     Status: None   Collection Time    08/12/14  5:28 AM      Result Value Ref Range Status   MRSA by PCR NEGATIVE  NEGATIVE Final   Comment:            The GeneXpert MRSA Assay (FDA     approved for NASAL specimens     only), is one component of a     comprehensive MRSA colonization     surveillance program. It is  not      intended to diagnose MRSA     infection nor to guide or     monitor treatment for     MRSA infections.  CLOSTRIDIUM DIFFICILE BY PCR     Status: Abnormal   Collection Time    08/12/14  8:34 AM      Result Value Ref Range Status   C difficile by pcr POSITIVE (*) NEGATIVE Final   Comment: CRITICAL RESULT CALLED TO, READ BACK BY AND VERIFIED WITH:     Quita SkyeV. JOHNSON RN 10:50 08/12/14 ([email or web address])     Studies: Ct Head Wo Contrast  08/11/2014   CLINICAL DATA:  Altered mental status, confusion, history dementia, currently being treated for C difficile colitis and pneumonia ; personal history hypertension, renal insufficiency, TIA, CHF  EXAM: CT HEAD WITHOUT CONTRAST  TECHNIQUE: Contiguous axial images were obtained from the base of the skull through the vertex without intravenous contrast.  COMPARISON:  04/15/2014  FINDINGS: Generalized atrophy.  Stable ventricular morphology.  No midline shift or mass effect.  Extensive small vessel chronic ischemic changes of deep cerebral white matter.  Old LEFT thalamic lacunar infarct.  Small old RIGHT parietal cortical infarct.  No intracranial hemorrhage, mass lesion or evidence of acute infarction.  No extra-axial fluid collections.  Atherosclerotic calcifications of the internal carotid arteries at the carotid siphons.  Minimal fluid in LEFT sphenoid sinus.  No acute osseous findings.  IMPRESSION: Atrophy with small vessel chronic ischemic changes of deep cerebral white matter.  Old lacunar infarcts at the RIGHT parietal region and LEFT thalamus.  No acute intracranial abnormalities.   Electronically Signed   By: Ulyses SouthwardMark  Boles M.D.   On: 08/11/2014 17:22    Scheduled Meds: . antiseptic oral rinse  7 mL Mouth Rinse BID  . aspirin  81 mg Oral q morning - 10a  . citalopram  10 mg Oral BH-q7a  . diltiazem (CARDIZEM) infusion  10 mg/hr Intravenous Once  . diltiazem  60 mg Oral QID  . enoxaparin (LOVENOX) injection  30 mg Subcutaneous Q24H  . feeding supplement  (ENSURE)  1 Container Oral TID PC & HS  . insulin aspart  0-9 Units Subcutaneous TID WC  . lacosamide  50 mg Oral BID  . levothyroxine  175 mcg Oral QAC breakfast  . megestrol  200 mg Oral Daily  . metronidazole  500 mg Intravenous Q8H  . sodium chloride  3 mL Intravenous Q12H  . vancomycin  125 mg Oral QID   Continuous Infusions: . dextrose 100 mL/hr at 08/13/14 1400    Principal Problem:   Dehydration Active Problems:   Hypothyroidism   Adult failure to thrive   Enteritis due to Clostridium difficile   Dysphagia   Atrial fibrillation with rapid ventricular response   end stage dementia   Severe protein-calorie malnutrition   Acute renal failure    Time spent: > 35 minutes    Penny PiaVEGA, Marquin Patino  [facility] Pager [phone]. If 7PM-7AM, please contact night-coverage at [email or web address], password [facility] 08/13/2014, 4:32 PM  LOS: 2 days

## 2014-08-13 NOTE — Evaluation (Signed)
Clinical/Bedside Swallow Evaluation Patient Details  Name: [name] BeringDorothy [name] MRN: [id number] Date of Birth: 1924-08-05  Today's Date: 08/13/2014 Time: [id number] SLP Time Calculation (min): 10 min  Past Medical History:  Past Medical History  Diagnosis Date  . Renal insufficiency   . Hypertension   . A-fib   . Anxiety   . Depression   . Hypothyroidism   . Inguinal hernia     repaired 2011, right  . Osteoarthritis   . Cerebral vascular accident   . DJD (degenerative joint disease)   . Recurrent UTI   . C. difficile colitis     recurrent  . Dementia   . Anemia   . Cataract   . Asteatotic eczema   . Peripheral vascular disease   . Hyponatremia   . Malnutrition   . Sepsis(995.91)   . TIA (transient ischemic attack)   . Urinary incontinence   . Congestive heart failure, unspecified 05/04/2014   Past Surgical History:  Past Surgical History  Procedure Laterality Date  . Inguinal hernia repair    . Bladder surgery      prolapsed bladder  . Cataract extraction      bilateral   HPI:  80 year old female with end stage dementia, recent pneumonia and c diff, recurrent aspiration, atrial fibrillation, weight loss sent to ED from SNF with lethargy. Bed and chair bound. Has not been eating or able to take medications recently. Has sacral decutibus. PCP has recommended comfort care, but family declines. In ED, found to have a sodium of 166. CXR without infiltrate and ua negative. BUN and creatinine above baseline. Has lost 10 lbs over the past few weeks. Per EDP, family became very defensive and angry when she asked palliative/hospice options.  Pt has been followed by SLP services in the past for chronic dysphagia and ongoing aspiration risk.     Assessment / Plan / Recommendation Clinical Impression  Pt's swallow function remains consistent with a dementia-based dysphagia marked by prolonged oral preparation and intermittent awareness; likely delayed swallow response;  symptoms of  pharyngeal deficit with multiple swallows per bolus.  Pt is likely intermittently aspirating despite best efforts to carefully handfeed and modify diet.  GIven family desire to continue POs, recommend continuing same diet - dysphagia 1 with nectar-thick liquids - with inherent risks, but this diet may pose least risk.  Per notes, family wants to treat aggressively.    No further SLP f/u warranted - rec continuing this diet at SNF.       Aspiration Risk  Moderate    Diet Recommendation Dysphagia 1 (Puree);Nectar-thick liquid   Liquid Administration via: Cup;Spoon Medication Administration: Crushed with puree Supervision: Trained caregiver to feed patient Compensations: Slow rate;Small sips/bites Postural Changes and/or Swallow Maneuvers: Seated upright 90 degrees    Other  Recommendations Oral Care Recommendations: Oral care BID   Follow Up Recommendations  None        Swallow Study Prior Functional Status       General HPI: 80 year old female with end stage dementia, recent pneumonia and c diff, recurrent aspiration, atrial fibrillation, weight loss sent to ED from SNF with lethargy. Bed and chair bound. Has not been eating or able to take medications recently. Has sacral decutibus. PCP has recommended comfort care, but family declines. In ED, found to have a sodium of 166. CXR without infiltrate and ua negative. BUN and creatinine above baseline. Has lost 10 lbs over the past few weeks. Per EDP, family became very defensive  and angry when she asked palliative/hospice options.  Pt has been followed by SLP services in the past for chronic dysphagia and ongoing aspiration risk.   Type of Study: Bedside swallow evaluation Diet Prior to this Study: Dysphagia 1 (puree);Nectar-thick liquids Temperature Spikes Noted: No Respiratory Status: Nasal cannula Behavior/Cognition: Alert;Confused Oral Cavity - Dentition: Edentulous Self-Feeding Abilities: Total assist Patient Positioning: Partially  reclined Baseline Vocal Quality: Hoarse Volitional Cough: Cognitively unable to elicit Volitional Swallow: Unable to elicit    Oral/Motor/Sensory Function Overall Oral Motor/Sensory Function: Appears within functional limits for tasks assessed   Ice Chips Ice chips: Not tested   Thin Liquid Thin Liquid: Not tested    Nectar Thick Nectar Thick Liquid: Impaired Presentation: Spoon Oral Phase Impairments: Reduced labial seal;Reduced lingual movement/coordination Oral phase functional implications: Prolonged oral transit;Oral residue Pharyngeal Phase Impairments: Suspected delayed Swallow;Decreased hyoid-laryngeal movement;Multiple swallows   Honey Thick Honey Thick Liquid: Not tested   Puree Puree: Impaired Presentation: Spoon Oral Phase Impairments: Reduced labial seal Oral Phase Functional Implications: Prolonged oral transit Pharyngeal Phase Impairments: Suspected delayed Swallow;Decreased hyoid-laryngeal movement;Multiple swallows   Solid   GO    Solid: Not tested       Blenda Mountsouture, Jadan Rouillard Laurice 08/13/2014,2:35 PM

## 2014-08-13 NOTE — Clinical Social Work Psychosocial (Signed)
     Clinical Social Work Department BRIEF PSYCHOSOCIAL ASSESSMENT 08/13/2014  Patient:  [name] [name],[name]     Account Number:  [id number]     Admit date:  08/11/2014  Clinical Social Worker:  Harless NakayamaAMBELAL,Dewel Lotter, LCSWA  Date/Time:  08/13/2014 10:40 AM  Referred by:  Physician  Date Referred:  08/13/2014 Referred for  SNF Placement   Other Referral:   Interview type:  Family Other interview type:   Spoke with pt daughter    PSYCHOSOCIAL DATA Living Status:  FACILITY Admitted from facility:  [name] Level of care:  Skilled Nursing Facility Primary support name:  Terrial RhodesDeborah Allred [phone] Primary support relationship to patient:  CHILD, ADULT Degree of support available:   Pt has supportive family    CURRENT CONCERNS Current Concerns  Post-Acute Placement   Other Concerns:    SOCIAL WORK ASSESSMENT / PLAN CSW notified that pt was admitted from facility. CSW spoke with pt daughter over the phone and she confirmed pt was admitted [facility]. [facility] about [facility] years now. Pt daughter informed CSW that pt family is very pleased with the facility and they spoke with the facility yesterday to ensure pt bed was being held. Pt does also have a son that assists with decision making. Pt daughter informed CSW that at discharge pt will need non-emergent ambulance transport. Pt daughter had no concerns and informed CSW that she is very happy with [facility] staff and the care that pt has received. Pt daughter informed CSW that [facility] has made this admission easier on the family by being so helpful.   Assessment/plan status:  Psychosocial Support/Ongoing Assessment of Needs Other assessment/ plan:   Information/referral to community resources:   None needed    PATIENTS/FAMILYS RESPONSE TO PLAN OF CARE: Pt family agreeable to pt returning to SNF at [facility]

## 2014-08-14 LAB — GLUCOSE, CAPILLARY
GLUCOSE-CAPILLARY: 170 mg/dL — AB (ref 70–99)
GLUCOSE-CAPILLARY: 75 mg/dL (ref 70–99)
Glucose-Capillary: 80 mg/dL (ref 70–99)
Glucose-Capillary: 95 mg/dL (ref 70–99)

## 2014-08-14 LAB — BASIC METABOLIC PANEL
Anion gap: 8 (ref 5–15)
BUN: 20 mg/dL (ref 6–23)
CALCIUM: 9.4 mg/dL (ref 8.4–10.5)
CO2: 23 mEq/L (ref 19–32)
Chloride: 119 mEq/L — ABNORMAL HIGH (ref 96–112)
Creatinine, Ser: 0.77 mg/dL (ref 0.50–1.10)
GFR calc Af Amer: 84 mL/min — ABNORMAL LOW (ref >90)
GFR calc non Af Amer: 73 mL/min — ABNORMAL LOW (ref >90)
GLUCOSE: 84 mg/dL (ref 70–99)
Potassium: 4.8 mEq/L (ref 3.7–5.3)
Sodium: 150 mEq/L — ABNORMAL HIGH (ref 137–147)

## 2014-08-14 MED ORDER — DEXTROSE 5 % IV SOLN
INTRAVENOUS | Status: DC
  Administered 2014-08-14: 90 mL via INTRAVENOUS
  Administered 2014-08-15: 04:00:00 via INTRAVENOUS

## 2014-08-14 MED ORDER — DILTIAZEM HCL ER COATED BEADS 120 MG PO CP24
120.0000 mg | ORAL_CAPSULE | Freq: Every day | ORAL | Status: DC
  Filled 2014-08-14: qty 1

## 2014-08-14 MED ORDER — DILTIAZEM HCL ER COATED BEADS 120 MG PO CP24: 120.0000 mg | ORAL_CAPSULE | Freq: Every day | ORAL | Status: DC

## 2014-08-14 MED ORDER — METRONIDAZOLE 500 MG PO TABS
500.0000 mg | ORAL_TABLET | Freq: Three times a day (TID) | ORAL | Status: DC
  Administered 2014-08-14 – 2014-08-15 (×2): 500 mg via ORAL
  Filled 2014-08-14 (×2): qty 1

## 2014-08-14 NOTE — Progress Notes (Signed)
[facility] PROGRESS NOTE  Laurena BeringDorothy Nickerson ZOX:[id number] DOB: 1923-12-23 DOA: 08/11/2014 PCP: Oneal GroutPANDEY, MAHIMA, MD From prior PN: With end stage dementia, recent pneumonia and c diff, recurrent aspiration, atrial fibrillation, weight loss sent to ED from SNF with lethargy. Bed and chair bound. Has not been eating or able to take medications recently. Has sacral decutibus. PCP has recommended comfort care, but family declines. In ED, found to have a sodium of 166. CXR without infiltrate and ua negative. BUN and creatinine above baseline. Has lost 10 lbs over the past few weeks. Per EDP, family became very defensive and angry when she asked palliative/hospice options.   Assessment/Plan: Hypernatremia - likely due to dehydration in the setting of active C diff infection  - Will continue D5W and reassess BMP next am.  C diff colitis  - C diff positive, continue Flagyl and Vancomycin  Acute on chronic aspiration pneumonia  - recently treated at the SNF, finished treatment course per emr.  A fib with RVR  - Continue cardizem po  - was on a NOAC (Xarelto) discontinued - On d/c will discuss whether or not family would like for this to be continued based on risks of falls and age. Currently will hold  Advanced dementia  - Pt on ensure and - Most likely contributing to poor oral intake  Severe protein calorie malnutrition  AKI - resolved with hydration   Stage 3 sacral decubitus ulcer - wound looks fairly good without drainage/cellulitis   Dysphagia - SLP evaluation today  - chronic aspiration   Hypothyroidism  - TSH low, decrease Synthroid from 200 to 175  - might have precipitated A fib w/RVR   Hyperglycemia  - Will continue SSI - will not provide D5 infusion overnight.  Hypokalemia  - due to GI losses, replete IV as necessary - currently resolved.   Code Status: DNR  Family Communication: No family at bedside Disposition Plan: inpatient, SNF when ready      Consultants:  None  Procedures:  None  Antibiotics:  Metronidazole and Vancomycin  HPI/Subjective: No new complaints. No acute issues reported overnight.  Objective: Filed Vitals:   08/14/14 1405  BP: 104/66  Pulse: 96  Temp: 97.4 F (36.3 C)  Resp: 16    Intake/Output Summary (Last 24 hours) at 08/14/14 1902 Last data filed at 08/14/14 1043  Gross per 24 hour  Intake    243 ml  Output      0 ml  Net    243 ml   Filed Weights   08/12/14 0500 08/13/14 0616 08/14/14 0418  Weight: 42.457 kg (93 lb 9.6 oz) 42.322 kg (93 lb 4.9 oz) 43.545 kg (96 lb)    Exam:   General:  Pt in nad, alert and awake  Cardiovascular: S1 and S2 present, no mrg  Respiratory: cta bl, no wheezes  Abdomen: soft, ND  Musculoskeletal: no clubbing   Data Reviewed: Basic Metabolic Panel:  Recent Labs Lab 08/11/14 1232 08/12/14 0501 08/13/14 0400 08/14/14 0454  NA 166* 143 152* 150*  K 4.6 2.7* 4.9 4.8  CL 124* 111 121* 119*  CO2 31 21 24 23   GLUCOSE 171* 544* 93 84  BUN 51* 27* 26* 20  CREATININE 1.42* 0.73 0.76 0.77  CALCIUM 10.0 6.3* 9.5 9.4   Liver Function Tests:  Recent Labs Lab 08/11/14 1232  AST 33  ALT 24  ALKPHOS 54  BILITOT 0.6  PROT 7.6  ALBUMIN 3.2*   No results found for this basename: LIPASE, AMYLASE,  in the last 168 hours No results found for this basename: AMMONIA,  in the last 168 hours CBC:  Recent Labs Lab 08/11/14 1232 08/12/14 0501 08/13/14 0400  WBC 10.1 7.4 9.1  HGB 13.6 9.1* 11.5*  HCT 45.7 31.5* 38.4  MCV 101.3* 101.3* 97.7  PLT 203 128* 157   Cardiac Enzymes: No results found for this basename: CKTOTAL, CKMB, CKMBINDEX, TROPONINI,  in the last 168 hours BNP (last 3 results) No results found for this basename: PROBNP,  in the last 8760 hours CBG:  Recent Labs Lab 08/13/14 1644 08/13/14 2102 08/14/14 0728 08/14/14 1144 08/14/14 1637  GLUCAP 144* 109* 80 95 170*    Recent Results (from the past 240 hour(s))   CULTURE, BLOOD (ROUTINE X 2)     Status: None   Collection Time    08/11/14 12:51 PM      Result Value Ref Range Status   Specimen Description BLOOD RIGHT FOREARM   Final   Special Requests BOTTLES DRAWN AEROBIC AND ANAEROBIC 4CC   Final   Culture  Setup Time     Final   Value: 08/11/2014 18:16     Performed at [facility]   Culture     Final   Value:        BLOOD CULTURE RECEIVED NO GROWTH TO DATE CULTURE WILL BE HELD FOR 5 DAYS BEFORE ISSUING A FINAL NEGATIVE REPORT     Performed at [facility]   Report Status PENDING   Incomplete  CULTURE, BLOOD (ROUTINE X 2)     Status: None   Collection Time    08/11/14  1:20 PM      Result Value Ref Range Status   Specimen Description BLOOD LEFT HAND   Final   Special Requests BOTTLES DRAWN AEROBIC AND ANAEROBIC 5CC   Final   Culture  Setup Time     Final   Value: 08/11/2014 18:16     Performed at [facility]   Culture     Final   Value:        BLOOD CULTURE RECEIVED NO GROWTH TO DATE CULTURE WILL BE HELD FOR 5 DAYS BEFORE ISSUING A FINAL NEGATIVE REPORT     Performed at [facility]   Report Status PENDING   Incomplete  URINE CULTURE     Status: None   Collection Time    08/11/14  2:53 PM      Result Value Ref Range Status   Specimen Description URINE, CATHETERIZED   Final   Special Requests NONE   Final   Culture  Setup Time     Final   Value: 08/10/2014 16:35     Performed at [facility]   Colony Count     Final   Value: NO GROWTH     Performed at [facility]   Culture     Final   Value: NO GROWTH     Performed at [facility]   Report Status 08/12/2014 FINAL   Final  MRSA PCR SCREENING     Status: None   Collection Time    08/12/14  5:28 AM      Result Value Ref Range Status   MRSA by PCR NEGATIVE  NEGATIVE Final   Comment:            The GeneXpert MRSA Assay (FDA     approved for NASAL specimens     only), is one component of a  comprehensive MRSA  colonization     surveillance program. It is not     intended to diagnose MRSA     infection nor to guide or     monitor treatment for     MRSA infections.  CLOSTRIDIUM DIFFICILE BY PCR     Status: Abnormal   Collection Time    08/12/14  8:34 AM      Result Value Ref Range Status   C difficile by pcr POSITIVE (*) NEGATIVE Final   Comment: CRITICAL RESULT CALLED TO, READ BACK BY AND VERIFIED WITH:     Quita Skye RN 10:50 08/12/14 ([email or web address])     Studies: No results found.  Scheduled Meds: . antiseptic oral rinse  7 mL Mouth Rinse BID  . aspirin  81 mg Oral q morning - 10a  . citalopram  10 mg Oral BH-q7a  . [START ON 08/15/2014] diltiazem  120 mg Oral Q breakfast  . diltiazem  60 mg Oral QID  . enoxaparin (LOVENOX) injection  30 mg Subcutaneous Q24H  . feeding supplement (ENSURE)  1 Container Oral TID PC & HS  . insulin aspart  0-9 Units Subcutaneous TID WC  . lacosamide  50 mg Oral BID  . levothyroxine  175 mcg Oral QAC breakfast  . megestrol  200 mg Oral Daily  . metroNIDAZOLE  500 mg Oral 3 times per day  . sodium chloride  3 mL Intravenous Q12H  . vancomycin  125 mg Oral QID   Continuous Infusions: . dextrose 90 mL (08/14/14 1301)    Principal Problem:   Dehydration Active Problems:   Hypothyroidism   Adult failure to thrive   Enteritis due to Clostridium difficile   Dysphagia   Atrial fibrillation with rapid ventricular response   end stage dementia   Severe protein-calorie malnutrition   Acute renal failure    Time spent: > 35 minutes    Penny Pia  [facility] Pager [phone]. If 7PM-7AM, please contact night-coverage at [email or web address], password [facility] 08/14/2014, 7:02 PM  LOS: 3 days

## 2014-08-14 NOTE — Progress Notes (Signed)
CSW (Clinical Social Worker) left voicemail for facility and notified that pt will likely be able to return tomorrow.  Abigaelle Verley, LCSWA [phone]

## 2014-08-15 LAB — BASIC METABOLIC PANEL
ANION GAP: 8 (ref 5–15)
BUN: 14 mg/dL (ref 6–23)
CALCIUM: 8.9 mg/dL (ref 8.4–10.5)
CO2: 23 meq/L (ref 19–32)
Chloride: 111 mEq/L (ref 96–112)
Creatinine, Ser: 0.66 mg/dL (ref 0.50–1.10)
GFR calc non Af Amer: 77 mL/min — ABNORMAL LOW (ref >90)
GFR, EST AFRICAN AMERICAN: 89 mL/min — AB (ref >90)
Glucose, Bld: 89 mg/dL (ref 70–99)
Potassium: 4.1 mEq/L (ref 3.7–5.3)
Sodium: 142 mEq/L (ref 137–147)

## 2014-08-15 LAB — GLUCOSE, CAPILLARY
GLUCOSE-CAPILLARY: 125 mg/dL — AB (ref 70–99)
Glucose-Capillary: 97 mg/dL (ref 70–99)
Glucose-Capillary: 82 mg/dL (ref 70–99)

## 2014-08-15 MED ORDER — ENSURE PUDDING PO PUDG: 1.0000 | Freq: Three times a day (TID) | ORAL | Status: AC

## 2014-08-15 MED ORDER — METRONIDAZOLE 500 MG PO TABS: 500.0000 mg | ORAL_TABLET | Freq: Three times a day (TID) | ORAL | Status: AC

## 2014-08-15 MED ORDER — DILTIAZEM HCL ER COATED BEADS 120 MG PO CP24: 120.0000 mg | ORAL_CAPSULE | Freq: Every day | ORAL | Status: AC

## 2014-08-15 NOTE — Progress Notes (Signed)
Report called to Debo, RN at [facility]. I updated him and made him aware that I have been unsuccessful at given pt her medications today as she has refused to swallow them. He stated they have the same [facility]. I reviewed d/c' instructions with sitter at bedside. Assessment unchanged from this am. Will closely monitor

## 2014-08-15 NOTE — Progress Notes (Signed)
[facility] transported pt and sitter at bedside

## 2014-08-15 NOTE — Progress Notes (Signed)
Physical Therapy Treatment Patient Details Name: [name] BeringDorothy [name] MRN: [id number] DOB: 1924/02/09 Today's Date: 08/15/2014    History of Present Illness Pt is an 80y.o. female presenting from SNF with pneumonia, dehydration and cdiff. Hx of end stage dementia, recurrent aspiration, afib with RVR, hypothyroidism, CVA, anemia, PVD, and CHF.     PT Comments    Pt admitted with above. Pt currently with functional limitations due to balance, strength and endurance deficits.  Pt will benefit from skilled PT to increase their independence and safety with mobility to allow discharge to the venue listed below.   Follow Up Recommendations  SNF;Supervision/Assistance - 24 hour     Equipment Recommendations  None recommended by PT    Recommendations for Other Services       Precautions / Restrictions Precautions Precautions: Fall Restrictions Weight Bearing Restrictions: No    Mobility  Bed Mobility Overal bed mobility: Needs Assistance Bed Mobility: Rolling Rolling: Max assist            Transfers                    Ambulation/Gait                 Stairs            Wheelchair Mobility    Modified Rankin (Stroke Patients Only)       Balance                                    Cognition Arousal/Alertness: Awake/alert Behavior During Therapy: Flat affect Overall Cognitive Status: No family/caregiver present to determine baseline cognitive functioning         Following Commands: Follows one step commands with increased time     Problem Solving: Slow processing;Decreased initiation;Difficulty sequencing;Requires verbal cues;Requires tactile cues      Exercises General Exercises - Upper Extremity Shoulder Flexion: AAROM;Both;5 reps;Supine Elbow Extension: AAROM;Both;5 reps;Supine General Exercises - Lower Extremity Ankle Circles/Pumps: AAROM;Both;5 reps;Supine Heel Slides: AAROM;Both;5 reps;Supine Straight Leg Raises:  AAROM;Both;5 reps;Supine    General Comments        Pertinent Vitals/Pain Pain Assessment: Faces Faces Pain Scale: Hurts little more Pain Location: hands Pain Descriptors / Indicators: Aching Pain Intervention(s): Monitored during session;Repositioned;Limited activity within patient's tolerance VSS    Home Living                      Prior Function            PT Goals (current goals can now be found in the care plan section) Progress towards PT goals: Not progressing toward goals - comment (Pt very stiff all 4's.  Question if pt at baseline.)    Frequency  Min 2X/week    PT Plan Current plan remains appropriate    Co-evaluation             End of Session Equipment Utilized During Treatment: Oxygen Activity Tolerance: Patient limited by fatigue;Patient limited by pain Patient left: in bed;with call bell/phone within reach     Time: 1057-1106 PT Time Calculation (min): 9 min  Charges:  $Therapeutic Exercise: 8-22 mins                    G CodesTawni Millers:      Mekenzie Modeste F 08/15/2014, 12:10 PM [employer] Marvella Jenning,PT Acute Rehabilitation [phone])

## 2014-08-15 NOTE — Discharge Summary (Signed)
Physician Discharge Summary  [name] [name] ZOX:[id number] DOB: 1924-09-17 DOA: 08/11/2014  PCP: Oneal Grout, MD  Admit date: 08/11/2014 Discharge date: 08/15/2014  Time spent: > 35 minutes  Recommendations for Outpatient Follow-up:  1. Please continue to monitor blood sugars 2. Continue diabetic diet 3. Was recently started on cardizem 4. Pt frail and in my opinion high risk for falls will hold xarelto  Discharge Diagnoses:  Principal Problem:   Dehydration Active Problems:   Hypothyroidism   Adult failure to thrive   Enteritis due to Clostridium difficile   Dysphagia   Atrial fibrillation with rapid ventricular response   end stage dementia   Severe protein-calorie malnutrition   Acute renal failure   Discharge Condition: stable  Diet recommendation: dysphagia 1 diabetic diet  Filed Weights   08/12/14 0500 08/13/14 0616 08/14/14 0418  Weight: 42.457 kg (93 lb 9.6 oz) 42.322 kg (93 lb 4.9 oz) 43.545 kg (96 lb)    History of present illness:  From original HPI: 80 y.o. female  With end stage dementia, recent pneumonia and c diff, recurrent aspiration, atrial fibrillation, weight loss sent to ED from SNF with lethargy. Bed and chair bound. Has not been eating or able to take medications recently. Has sacral decutibus. PCP has recommended comfort care, but family declines. In ED, found to have a sodium of 166   Hospital Course:  Atrial fibrillation - Will hold anticoagulation with xarelto or similar agents as pt elderly with high risk of falls. Discussed with son who is in agreement. - Continue diltiazem  DM - diabetic diet - routine cbg monitoring (atleast 2 times per day fasting and postprandial)  Dehydration - resolved with IVF rehydration - given history of poor oral intake will hold lasix on discharge  C diff - 10 mor days of metronidazole oral.  Procedures: None  Consultations:  None  Discharge Exam: Filed Vitals:   08/15/14 1223  BP:  128/93  Pulse:   Temp:   Resp: 16    General: Pt in nad, alert and awake Cardiovascular: s1 and s2 present, no rubs Respiratory: cta bl, no wheezes  Discharge Instructions You were cared for by a hospitalist during your hospital stay. If you have any questions about your discharge medications or the care you received while you were in the hospital after you are discharged, you can call the unit and asked to speak with the hospitalist on call if the hospitalist that took care of you is not available. Once you are discharged, your primary care physician will handle any further medical issues. Please note that NO REFILLS for any discharge medications will be authorized once you are discharged, as it is imperative that you return to your primary care physician (or establish a relationship with a primary care physician if you do not have one) for your aftercare needs so that they can reassess your need for medications and monitor your lab values.  Discharge Instructions   Call MD for:  extreme fatigue    Complete by:  As directed      Call MD for:  redness, tenderness, or signs of infection (pain, swelling, redness, odor or green/yellow discharge around incision site)    Complete by:  As directed      Call MD for:  temperature >100.4    Complete by:  As directed      Diet - low sodium heart healthy    Complete by:  As directed      Increase activity slowly  Complete by:  As directed           Current Discharge Medication List    START taking these medications   Details  diltiazem (CARDIZEM CD) 120 MG 24 hr capsule Take 1 capsule (120 mg total) by mouth daily with breakfast. Qty: 30 capsule, Refills: 0    feeding supplement, ENSURE, (ENSURE) PUDG Take 1 Container by mouth 4 (four) times daily - after meals and at bedtime. Refills: 0    metroNIDAZOLE (FLAGYL) 500 MG tablet Take 1 tablet (500 mg total) by mouth every 8 (eight) hours. Qty: 30 tablet, Refills: 0      CONTINUE these  medications which have NOT CHANGED   Details  acetaminophen (TYLENOL) 500 MG tablet Take 500 mg by mouth 2 (two) times daily.    aspirin 81 MG chewable tablet Chew 81 mg by mouth every morning.    bisacodyl (DULCOLAX) 10 MG suppository Place 10 mg rectally See admin instructions. Every 3 days as needed for no bowl movement    citalopram (CELEXA) 10 MG tablet Take 10 mg by mouth every morning.    fluticasone (FLONASE) 50 MCG/ACT nasal spray Place 1 spray into the nose at bedtime.     hydroxypropyl methylcellulose (ISOPTO TEARS) 2.5 % ophthalmic solution Place 1 drop into both eyes 4 (four) times daily as needed.    ipratropium-albuterol (DUONEB) 0.5-2.5 (3) MG/3ML SOLN Take 3 mLs by nebulization every 6 (six) hours as needed (for congestion).    iron polysaccharides (NIFEREX) 150 MG capsule Take 150 mg by mouth 2 (two) times daily.    lacosamide (VIMPAT) 50 MG TABS tablet Take 50 mg by mouth 2 (two) times daily.    levothyroxine (SYNTHROID, LEVOTHROID) 200 MCG tablet Take 200 mcg by mouth daily before breakfast.    mirtazapine (REMERON) 7.5 MG tablet Take 7.5 mg by mouth at bedtime.    Multiple Vitamins-Minerals (CERTAGEN PO) Take 1 tablet by mouth every morning.    Protein (PROCEL) POWD Take 1 scoop by mouth 3 (three) times daily.    sennosides-docusate sodium (SENOKOT-S) 8.6-50 MG tablet Take 1 tablet by mouth 2 (two) times daily.    vitamin B-12 (CYANOCOBALAMIN) 1000 MCG tablet Take 1,000 mcg by mouth daily.      STOP taking these medications     diltiazem (CARDIZEM) 60 MG tablet      furosemide (LASIX) 40 MG tablet      HYDROcodone-acetaminophen (NORCO/VICODIN) 5-325 MG per tablet        Allergies  Allergen Reactions  . Penicillins     "red faced" and on MAR      The results of significant diagnostics from this hospitalization (including imaging, microbiology, ancillary and laboratory) are listed below for reference.    Significant Diagnostic Studies: Ct Head  Wo Contrast  08/11/2014   CLINICAL DATA:  Altered mental status, confusion, history dementia, currently being treated for C difficile colitis and pneumonia ; personal history hypertension, renal insufficiency, TIA, CHF  EXAM: CT HEAD WITHOUT CONTRAST  TECHNIQUE: Contiguous axial images were obtained from the base of the skull through the vertex without intravenous contrast.  COMPARISON:  04/15/2014  FINDINGS: Generalized atrophy.  Stable ventricular morphology.  No midline shift or mass effect.  Extensive small vessel chronic ischemic changes of deep cerebral white matter.  Old LEFT thalamic lacunar infarct.  Small old RIGHT parietal cortical infarct.  No intracranial hemorrhage, mass lesion or evidence of acute infarction.  No extra-axial fluid collections.  Atherosclerotic calcifications of the internal  carotid arteries at the carotid siphons.  Minimal fluid in LEFT sphenoid sinus.  No acute osseous findings.  IMPRESSION: Atrophy with small vessel chronic ischemic changes of deep cerebral white matter.  Old lacunar infarcts at the RIGHT parietal region and LEFT thalamus.  No acute intracranial abnormalities.   Electronically Signed   By: Ulyses SouthwardMark  Boles M.D.   On: 08/11/2014 17:22   Dg Chest Portable 1 View  08/11/2014   CLINICAL DATA:  Altered mental status.  EXAM: PORTABLE CHEST - 1 VIEW  COMPARISON:  02/15/2012  FINDINGS: The heart is enlarged but stable. There is a chronic left pleural effusion with overlying atelectasis and pulmonary scarring. The right lung remains relatively clear. Stable tortuosity and calcification of the thoracic aorta.  IMPRESSION: Chronic left pleural effusion with overlying atelectasis and pulmonary scarring. The effusion is smaller when compared to the prior study.  The right lung appears clear.   Electronically Signed   By: Loralie ChampagneMark  Gallerani M.D.   On: 08/11/2014 13:49    Microbiology: Recent Results (from the past 240 hour(s))  CULTURE, BLOOD (ROUTINE X 2)     Status: None    Collection Time    08/11/14 12:51 PM      Result Value Ref Range Status   Specimen Description BLOOD RIGHT FOREARM   Final   Special Requests BOTTLES DRAWN AEROBIC AND ANAEROBIC 4CC   Final   Culture  Setup Time     Final   Value: 08/11/2014 18:16     Performed at [facility]   Culture     Final   Value:        BLOOD CULTURE RECEIVED NO GROWTH TO DATE CULTURE WILL BE HELD FOR 5 DAYS BEFORE ISSUING A FINAL NEGATIVE REPORT     Performed at [facility]   Report Status PENDING   Incomplete  CULTURE, BLOOD (ROUTINE X 2)     Status: None   Collection Time    08/11/14  1:20 PM      Result Value Ref Range Status   Specimen Description BLOOD LEFT HAND   Final   Special Requests BOTTLES DRAWN AEROBIC AND ANAEROBIC 5CC   Final   Culture  Setup Time     Final   Value: 08/11/2014 18:16     Performed at [facility]   Culture     Final   Value:        BLOOD CULTURE RECEIVED NO GROWTH TO DATE CULTURE WILL BE HELD FOR 5 DAYS BEFORE ISSUING A FINAL NEGATIVE REPORT     Performed at [facility]   Report Status PENDING   Incomplete  URINE CULTURE     Status: None   Collection Time    08/11/14  2:53 PM      Result Value Ref Range Status   Specimen Description URINE, CATHETERIZED   Final   Special Requests NONE   Final   Culture  Setup Time     Final   Value: 08/10/2014 16:35     Performed at [facility]   Colony Count     Final   Value: NO GROWTH     Performed at [facility]   Culture     Final   Value: NO GROWTH     Performed at [facility]   Report Status 08/12/2014 FINAL   Final  MRSA PCR SCREENING     Status: None   Collection Time    08/12/14  5:28 AM      Result Value Ref Range Status   MRSA by PCR NEGATIVE  NEGATIVE Final   Comment:            The GeneXpert MRSA Assay (FDA     approved for NASAL specimens     only), is one component of a     comprehensive MRSA colonization     surveillance program. It is not      intended to diagnose MRSA     infection nor to guide or     monitor treatment for     MRSA infections.  CLOSTRIDIUM DIFFICILE BY PCR     Status: Abnormal   Collection Time    08/12/14  8:34 AM      Result Value Ref Range Status   C difficile by pcr POSITIVE (*) NEGATIVE Final   Comment: CRITICAL RESULT CALLED TO, READ BACK BY AND VERIFIED WITH:     Quita SkyeV. JOHNSON RN 10:50 08/12/14 ([email or web address])     Labs: Basic Metabolic Panel:  Recent Labs Lab 08/11/14 1232 08/12/14 0501 08/13/14 0400 08/14/14 0454 08/15/14 1113  NA 166* 143 152* 150* 142  K 4.6 2.7* 4.9 4.8 4.1  CL 124* 111 121* 119* 111  CO2 31 21 24 23 23   GLUCOSE 171* 544* 93 84 89  BUN 51* 27* 26* 20 14  CREATININE 1.42* 0.73 0.76 0.77 0.66  CALCIUM 10.0 6.3* 9.5 9.4 8.9   Liver Function Tests:  Recent Labs Lab 08/11/14 1232  AST 33  ALT 24  ALKPHOS 54  BILITOT 0.6  PROT 7.6  ALBUMIN 3.2*   No results found for this basename: LIPASE, AMYLASE,  in the last 168 hours No results found for this basename: AMMONIA,  in the last 168 hours CBC:  Recent Labs Lab 08/11/14 1232 08/12/14 0501 08/13/14 0400  WBC 10.1 7.4 9.1  HGB 13.6 9.1* 11.5*  HCT 45.7 31.5* 38.4  MCV 101.3* 101.3* 97.7  PLT 203 128* 157   Cardiac Enzymes: No results found for this basename: CKTOTAL, CKMB, CKMBINDEX, TROPONINI,  in the last 168 hours BNP: BNP (last 3 results) No results found for this basename: PROBNP,  in the last 8760 hours CBG:  Recent Labs Lab 08/14/14 1144 08/14/14 1637 08/14/14 2022 08/15/14 0735 08/15/14 1116  GLUCAP 95 170* 75 97 82       Signed:  Joanell Cressler  [facility] 08/15/2014, 1:06 PM

## 2014-08-15 NOTE — Progress Notes (Signed)
CSW (Clinical Child psychotherapistocial Worker) prepared pt dc packet and placed with shadow chart. CSW arranged non-emergent ambulance transport for 3:30pm. Pt daughter notified of dc. She informed CSW that pt hired sitter will be arriving to the hospital at about 3:15pm to go over dc paperwork. CSW notified pt nurse and facility. CSW signing off.  Hector Taft, LCSWA [phone]

## 2014-08-17 LAB — CULTURE, BLOOD (ROUTINE X 2)
Culture: NO GROWTH
Culture: NO GROWTH

## 2014-08-19 ENCOUNTER — Encounter (HOSPITAL_COMMUNITY): Payer: Self-pay | Admitting: Emergency Medicine

## 2014-08-19 ENCOUNTER — Emergency Department (HOSPITAL_COMMUNITY): Payer: PRIVATE HEALTH INSURANCE

## 2014-08-19 ENCOUNTER — Inpatient Hospital Stay (HOSPITAL_COMMUNITY)
Admission: EM | Admit: 2014-08-19 | Discharge: 2014-09-16 | DRG: 871 | Disposition: E | Payer: PRIVATE HEALTH INSURANCE | Attending: Internal Medicine | Admitting: Internal Medicine

## 2014-08-19 DIAGNOSIS — E876 Hypokalemia: Secondary | ICD-10-CM | POA: Diagnosis present

## 2014-08-19 DIAGNOSIS — Z9841 Cataract extraction status, right eye: Secondary | ICD-10-CM

## 2014-08-19 DIAGNOSIS — Z9842 Cataract extraction status, left eye: Secondary | ICD-10-CM | POA: Diagnosis not present

## 2014-08-19 DIAGNOSIS — I10 Essential (primary) hypertension: Secondary | ICD-10-CM | POA: Diagnosis present

## 2014-08-19 DIAGNOSIS — Z7982 Long term (current) use of aspirin: Secondary | ICD-10-CM | POA: Diagnosis not present

## 2014-08-19 DIAGNOSIS — R6520 Severe sepsis without septic shock: Secondary | ICD-10-CM | POA: Diagnosis present

## 2014-08-19 DIAGNOSIS — Z66 Do not resuscitate: Secondary | ICD-10-CM | POA: Diagnosis present

## 2014-08-19 DIAGNOSIS — J9601 Acute respiratory failure with hypoxia: Secondary | ICD-10-CM | POA: Diagnosis present

## 2014-08-19 DIAGNOSIS — F03C0 Unspecified dementia, severe, without behavioral disturbance, psychotic disturbance, mood disturbance, and anxiety: Secondary | ICD-10-CM | POA: Diagnosis not present

## 2014-08-19 DIAGNOSIS — R569 Unspecified convulsions: Secondary | ICD-10-CM | POA: Diagnosis present

## 2014-08-19 DIAGNOSIS — Z79899 Other long term (current) drug therapy: Secondary | ICD-10-CM | POA: Diagnosis not present

## 2014-08-19 DIAGNOSIS — M1990 Unspecified osteoarthritis, unspecified site: Secondary | ICD-10-CM | POA: Diagnosis present

## 2014-08-19 DIAGNOSIS — E43 Unspecified severe protein-calorie malnutrition: Secondary | ICD-10-CM | POA: Diagnosis present

## 2014-08-19 DIAGNOSIS — R1314 Dysphagia, pharyngoesophageal phase: Secondary | ICD-10-CM | POA: Diagnosis present

## 2014-08-19 DIAGNOSIS — F0390 Unspecified dementia without behavioral disturbance: Secondary | ICD-10-CM | POA: Diagnosis present

## 2014-08-19 DIAGNOSIS — D649 Anemia, unspecified: Secondary | ICD-10-CM | POA: Diagnosis present

## 2014-08-19 DIAGNOSIS — Y95 Nosocomial condition: Secondary | ICD-10-CM | POA: Diagnosis present

## 2014-08-19 DIAGNOSIS — Z681 Body mass index (BMI) 19 or less, adult: Secondary | ICD-10-CM | POA: Diagnosis not present

## 2014-08-19 DIAGNOSIS — I482 Chronic atrial fibrillation, unspecified: Secondary | ICD-10-CM | POA: Insufficient documentation

## 2014-08-19 DIAGNOSIS — R532 Functional quadriplegia: Secondary | ICD-10-CM | POA: Diagnosis present

## 2014-08-19 DIAGNOSIS — E039 Hypothyroidism, unspecified: Secondary | ICD-10-CM | POA: Diagnosis present

## 2014-08-19 DIAGNOSIS — IMO0001 Reserved for inherently not codable concepts without codable children: Secondary | ICD-10-CM | POA: Diagnosis not present

## 2014-08-19 DIAGNOSIS — J690 Pneumonitis due to inhalation of food and vomit: Secondary | ICD-10-CM | POA: Diagnosis present

## 2014-08-19 DIAGNOSIS — I739 Peripheral vascular disease, unspecified: Secondary | ICD-10-CM | POA: Diagnosis present

## 2014-08-19 DIAGNOSIS — Z8673 Personal history of transient ischemic attack (TIA), and cerebral infarction without residual deficits: Secondary | ICD-10-CM

## 2014-08-19 DIAGNOSIS — I272 Other secondary pulmonary hypertension: Secondary | ICD-10-CM | POA: Diagnosis present

## 2014-08-19 DIAGNOSIS — I509 Heart failure, unspecified: Secondary | ICD-10-CM

## 2014-08-19 DIAGNOSIS — Z87891 Personal history of nicotine dependence: Secondary | ICD-10-CM | POA: Diagnosis not present

## 2014-08-19 DIAGNOSIS — Z87440 Personal history of urinary (tract) infections: Secondary | ICD-10-CM

## 2014-08-19 DIAGNOSIS — Z880 Allergy status to penicillin: Secondary | ICD-10-CM

## 2014-08-19 DIAGNOSIS — R0600 Dyspnea, unspecified: Secondary | ICD-10-CM | POA: Diagnosis present

## 2014-08-19 DIAGNOSIS — A0472 Enterocolitis due to Clostridium difficile, not specified as recurrent: Secondary | ICD-10-CM | POA: Insufficient documentation

## 2014-08-19 DIAGNOSIS — R1310 Dysphagia, unspecified: Secondary | ICD-10-CM

## 2014-08-19 DIAGNOSIS — Z7401 Bed confinement status: Secondary | ICD-10-CM | POA: Diagnosis not present

## 2014-08-19 DIAGNOSIS — A419 Sepsis, unspecified organism: Secondary | ICD-10-CM | POA: Diagnosis present

## 2014-08-19 DIAGNOSIS — J189 Pneumonia, unspecified organism: Secondary | ICD-10-CM | POA: Diagnosis present

## 2014-08-19 DIAGNOSIS — I4891 Unspecified atrial fibrillation: Secondary | ICD-10-CM

## 2014-08-19 DIAGNOSIS — I5032 Chronic diastolic (congestive) heart failure: Secondary | ICD-10-CM | POA: Diagnosis present

## 2014-08-19 DIAGNOSIS — Z931 Gastrostomy status: Secondary | ICD-10-CM

## 2014-08-19 DIAGNOSIS — R627 Adult failure to thrive: Secondary | ICD-10-CM | POA: Diagnosis present

## 2014-08-19 DIAGNOSIS — A047 Enterocolitis due to Clostridium difficile: Secondary | ICD-10-CM | POA: Diagnosis present

## 2014-08-19 LAB — CBC WITH DIFFERENTIAL/PLATELET
Basophils Absolute: 0 10*3/uL (ref 0.0–0.1)
Basophils Relative: 0 % (ref 0–1)
Eosinophils Absolute: 0.1 10*3/uL (ref 0.0–0.7)
Eosinophils Relative: 1 % (ref 0–5)
HCT: 40.2 % (ref 36.0–46.0)
Hemoglobin: 12.9 g/dL (ref 12.0–15.0)
LYMPHS ABS: 0.5 10*3/uL — AB (ref 0.7–4.0)
LYMPHS PCT: 7 % — AB (ref 12–46)
MCH: 30 pg (ref 26.0–34.0)
MCHC: 32.1 g/dL (ref 30.0–36.0)
MCV: 93.5 fL (ref 78.0–100.0)
Monocytes Absolute: 0.3 10*3/uL (ref 0.1–1.0)
Monocytes Relative: 4 % (ref 3–12)
Neutro Abs: 6.2 10*3/uL (ref 1.7–7.7)
Neutrophils Relative %: 88 % — ABNORMAL HIGH (ref 43–77)
PLATELETS: 281 10*3/uL (ref 150–400)
RBC: 4.3 MIL/uL (ref 3.87–5.11)
RDW: 17.3 % — ABNORMAL HIGH (ref 11.5–15.5)
WBC: 7.1 10*3/uL (ref 4.0–10.5)

## 2014-08-19 LAB — BASIC METABOLIC PANEL
Anion gap: 11 (ref 5–15)
BUN: 10 mg/dL (ref 6–23)
CO2: 24 mEq/L (ref 19–32)
Calcium: 9.6 mg/dL (ref 8.4–10.5)
Chloride: 108 mEq/L (ref 96–112)
Creatinine, Ser: 0.67 mg/dL (ref 0.50–1.10)
GFR calc Af Amer: 88 mL/min — ABNORMAL LOW (ref >90)
GFR calc non Af Amer: 76 mL/min — ABNORMAL LOW (ref >90)
GLUCOSE: 110 mg/dL — AB (ref 70–99)
POTASSIUM: 4 meq/L (ref 3.7–5.3)
SODIUM: 143 meq/L (ref 137–147)

## 2014-08-19 LAB — BLOOD GAS, ARTERIAL
Acid-Base Excess: 4.3 mmol/L — ABNORMAL HIGH (ref 0.0–2.0)
BICARBONATE: 23.4 meq/L (ref 20.0–24.0)
Delivery systems: POSITIVE
Drawn by: 33176
EXPIRATORY PAP: 5
FIO2: 80 %
Inspiratory PAP: 15
Mode: POSITIVE
O2 Saturation: 96.2 %
PATIENT TEMPERATURE: 99.7
PO2 ART: 90.9 mmHg (ref 80.0–100.0)
TCO2: 24.9 mmol/L (ref 0–100)
pCO2 arterial: 49.5 mmHg — ABNORMAL HIGH (ref 35.0–45.0)
pH, Arterial: 7.3 — ABNORMAL LOW (ref 7.350–7.450)

## 2014-08-19 MED ORDER — SODIUM CHLORIDE 0.9 % IV SOLN
250.0000 mg | Freq: Two times a day (BID) | INTRAVENOUS | Status: DC
  Administered 2014-08-19 – 2014-08-20 (×2): 250 mg via INTRAVENOUS
  Filled 2014-08-19 (×3): qty 250

## 2014-08-19 MED ORDER — VANCOMYCIN 50 MG/ML ORAL SOLUTION
125.0000 mg | Freq: Four times a day (QID) | ORAL | Status: DC
  Administered 2014-08-19 – 2014-08-20 (×2): 125 mg via ORAL
  Filled 2014-08-19 (×7): qty 2.5

## 2014-08-19 MED ORDER — VANCOMYCIN HCL IN DEXTROSE 1-5 GM/200ML-% IV SOLN
1000.0000 mg | Freq: Once | INTRAVENOUS | Status: AC
  Administered 2014-08-19: 1000 mg via INTRAVENOUS
  Filled 2014-08-19: qty 200

## 2014-08-19 MED ORDER — LEVOTHYROXINE SODIUM 200 MCG PO TABS
200.0000 ug | ORAL_TABLET | Freq: Every day | ORAL | Status: DC
  Filled 2014-08-19 (×2): qty 1

## 2014-08-19 MED ORDER — ACETAMINOPHEN 650 MG RE SUPP: 650.0000 mg | Freq: Four times a day (QID) | RECTAL | Status: DC | PRN

## 2014-08-19 MED ORDER — LACOSAMIDE 50 MG PO TABS: 50.0000 mg | ORAL_TABLET | Freq: Two times a day (BID) | ORAL | Status: DC

## 2014-08-19 MED ORDER — FUROSEMIDE 10 MG/ML IJ SOLN
40.0000 mg | Freq: Every day | INTRAMUSCULAR | Status: DC
  Administered 2014-08-19: 40 mg via INTRAVENOUS
  Filled 2014-08-19 (×2): qty 4

## 2014-08-19 MED ORDER — ONDANSETRON HCL 4 MG/2ML IJ SOLN: 4.0000 mg | Freq: Four times a day (QID) | INTRAMUSCULAR | Status: DC | PRN

## 2014-08-19 MED ORDER — ALBUTEROL SULFATE (2.5 MG/3ML) 0.083% IN NEBU
5.0000 mg | INHALATION_SOLUTION | Freq: Once | RESPIRATORY_TRACT | Status: AC
  Administered 2014-08-19: 5 mg via RESPIRATORY_TRACT
  Filled 2014-08-19: qty 6

## 2014-08-19 MED ORDER — DILTIAZEM LOAD VIA INFUSION
5.0000 mg | Freq: Once | INTRAVENOUS | Status: AC
  Administered 2014-08-19: 5 mg via INTRAVENOUS
  Filled 2014-08-19: qty 5

## 2014-08-19 MED ORDER — SODIUM CHLORIDE 0.9 % IJ SOLN
3.0000 mL | Freq: Two times a day (BID) | INTRAMUSCULAR | Status: DC
  Administered 2014-08-19 – 2014-08-24 (×7): 3 mL via INTRAVENOUS

## 2014-08-19 MED ORDER — ONDANSETRON HCL 4 MG PO TABS: 4.0000 mg | ORAL_TABLET | Freq: Four times a day (QID) | ORAL | Status: DC | PRN

## 2014-08-19 MED ORDER — VANCOMYCIN HCL IN DEXTROSE 750-5 MG/150ML-% IV SOLN: 750.0000 mg | INTRAVENOUS | Status: DC

## 2014-08-19 MED ORDER — DEXTROSE 5 % IV SOLN
1.0000 g | Freq: Once | INTRAVENOUS | Status: AC
  Administered 2014-08-19: 1 g via INTRAVENOUS
  Filled 2014-08-19: qty 1

## 2014-08-19 MED ORDER — SODIUM CHLORIDE 0.9 % IV SOLN
50.0000 mg | Freq: Two times a day (BID) | INTRAVENOUS | Status: DC
  Administered 2014-08-20 – 2014-08-24 (×11): 50 mg via INTRAVENOUS
  Filled 2014-08-19 (×20): qty 5

## 2014-08-19 MED ORDER — MORPHINE SULFATE 2 MG/ML IJ SOLN
1.0000 mg | INTRAMUSCULAR | Status: DC | PRN
Start: 2014-08-19 — End: 2014-08-25
  Administered 2014-08-21 – 2014-08-24 (×6): 1 mg via INTRAVENOUS
  Filled 2014-08-19 (×7): qty 1

## 2014-08-19 MED ORDER — DEXTROSE 5 % IV SOLN
5.0000 mg/h | INTRAVENOUS | Status: DC
  Administered 2014-08-19 – 2014-08-21 (×2): 5 mg/h via INTRAVENOUS
  Administered 2014-08-22 – 2014-08-23 (×5): 10 mg/h via INTRAVENOUS
  Administered 2014-08-24: 15 mg/h via INTRAVENOUS
  Administered 2014-08-25: 13 mg/h via INTRAVENOUS
  Filled 2014-08-19 (×3): qty 100

## 2014-08-19 MED ORDER — ACETAMINOPHEN 325 MG PO TABS: 650.0000 mg | ORAL_TABLET | Freq: Four times a day (QID) | ORAL | Status: DC | PRN

## 2014-08-19 MED ORDER — IPRATROPIUM BROMIDE 0.02 % IN SOLN
0.5000 mg | Freq: Once | RESPIRATORY_TRACT | Status: AC
  Administered 2014-08-19: 0.5 mg via RESPIRATORY_TRACT
  Filled 2014-08-19: qty 2.5

## 2014-08-19 MED ORDER — VANCOMYCIN HCL 125 MG PO CAPS: 125.0000 mg | ORAL_CAPSULE | Freq: Four times a day (QID) | ORAL | Status: DC

## 2014-08-19 MED ORDER — HEPARIN SODIUM (PORCINE) 5000 UNIT/ML IJ SOLN
5000.0000 [IU] | Freq: Three times a day (TID) | INTRAMUSCULAR | Status: DC
  Administered 2014-08-19 – 2014-08-25 (×17): 5000 [IU] via SUBCUTANEOUS
  Filled 2014-08-19 (×22): qty 1

## 2014-08-19 NOTE — Progress Notes (Addendum)
ANTIBIOTIC CONSULT NOTE - INITIAL  Pharmacy Consult for Vancomycin/Imipenem Indication: HCAP  Allergies  Allergen Reactions  . Penicillins     [facility]    Patient Measurements: Height: 5\' 5"  (165.1 cm) Weight: 96 lb (43.545 kg) IBW/kg (Calculated) : 57  Vital Signs: BP: 170/97 mmHg (11/03 1515) Pulse Rate: 80 (11/03 1515) Intake/Output from previous day:   Intake/Output from this shift:    Labs:  Recent Labs  2014/07/06 1450  WBC 7.1  HGB 12.9  PLT 281  CREATININE 0.67   Estimated Creatinine Clearance: 33.4 mL/min (by C-G formula based on Cr of 0.67). No results for input(s): VANCOTROUGH, VANCOPEAK, VANCORANDOM, GENTTROUGH, GENTPEAK, GENTRANDOM, TOBRATROUGH, TOBRAPEAK, TOBRARND, AMIKACINPEAK, AMIKACINTROU, AMIKACIN in the last 72 hours.   Microbiology: Recent Results (from the past 720 hour(s))  Blood culture (routine x 2)     Status: None   Collection Time: 08/11/14 12:51 PM  Result Value Ref Range Status   Specimen Description BLOOD RIGHT FOREARM  Final   Special Requests BOTTLES DRAWN AEROBIC AND ANAEROBIC 4CC  Final   Culture  Setup Time   Final    08/11/2014 18:16 Performed at [facility]    Culture   Final    NO GROWTH 5 DAYS Performed at [facility]    Report Status 08/17/2014 FINAL  Final  Blood culture (routine x 2)     Status: None   Collection Time: 08/11/14  1:20 PM  Result Value Ref Range Status   Specimen Description BLOOD LEFT HAND  Final   Special Requests BOTTLES DRAWN AEROBIC AND ANAEROBIC 5CC  Final   Culture  Setup Time   Final    08/11/2014 18:16 Performed at [facility]    Culture   Final    NO GROWTH 5 DAYS Performed at [facility]    Report Status 08/17/2014 FINAL  Final  Urine culture     Status: None   Collection Time: 08/11/14  2:53 PM  Result Value Ref Range Status   Specimen Description URINE, CATHETERIZED  Final   Special Requests NONE  Final   Culture  Setup Time    Final    08/10/2014 16:35 Performed at [facility]   Colony Count NO GROWTH Performed at [facility]  Final   Culture NO GROWTH Performed at [facility]  Final   Report Status 08/12/2014 FINAL  Final  MRSA PCR Screening     Status: None   Collection Time: 08/12/14  5:28 AM  Result Value Ref Range Status   MRSA by PCR NEGATIVE NEGATIVE Final    Comment:        The GeneXpert MRSA Assay (FDA approved for NASAL specimens only), is one component of a comprehensive MRSA colonization surveillance program. It is not intended to diagnose MRSA infection nor to guide or monitor treatment for MRSA infections.  Clostridium Difficile by PCR     Status: Abnormal   Collection Time: 08/12/14  8:34 AM  Result Value Ref Range Status   C difficile by pcr POSITIVE (A) NEGATIVE Final    Comment: CRITICAL RESULT CALLED TO, READ BACK BY AND VERIFIED WITH: Quita SkyeV. JOHNSON RN 10:50 08/12/14 ([email or web address])    Medical History: Past Medical History  Diagnosis Date  . Renal insufficiency   . Hypertension   . A-fib   . Anxiety   . Depression   . Hypothyroidism   . Inguinal hernia     repaired 2011, right  .  Osteoarthritis   . Cerebral vascular accident   . DJD (degenerative joint disease)   . Recurrent UTI   . C. difficile colitis     recurrent  . Dementia   . Anemia   . Cataract   . Asteatotic eczema   . Peripheral vascular disease   . Hyponatremia   . Malnutrition   . Sepsis(995.91)   . TIA (transient ischemic attack)   . Urinary incontinence   . Congestive heart failure, unspecified 05/04/2014    Medications:  Scheduled:  . [START ON 08/20/2014] vancomycin  750 mg Intravenous Q24H   Assessment: 80 yo f who presented to the ED from SNF on 11/3 for lethargy.  Patient has not been able to eat or take medications recently.  Patient has a history of recent pneumonia, c diff infection, and recurrent aspiration.  Pharmacy is consulted to dose vancomycin and imipenem for  HCAP.  Patient weights 43.5 kg and has a CrCl ~33.4 ml/min.  Will give a vancomycin 1,000 mg IV load x 1 then begin a maintenance dose of 750 mg IV q24hrs. Will also begin imipenem 250 mg IV q12hrs.  Wbc 7.1 and SCr of 0.67. No cultures yet.    Goal of Therapy:  Vancomycin trough level 15-20 mcg/ml  Plan:  Vancomycin 1,000 mg IV load x 1 Followed by vancomycin 750 mg IV q24hrs Imipenem 250 mg IV q12hrs Monitor CBC, cultures, renal function, clinical course of patient  [name] [name], PharmD Clinical Pharmacy Resident Pager: [phone] 09/04/2014 4:58 PM

## 2014-08-19 NOTE — ED Notes (Signed)
MD at bedside. 

## 2014-08-19 NOTE — ED Provider Notes (Signed)
CSN: [id number]     Arrival date & time 09/09/2014  1441 History   First MD Initiated Contact with Patient 08/29/2014 1454     Chief Complaint  Patient presents with  . Respiratory Distress    LEVEL 5 CAVEAT DUE TO ALTERED MENTAL STATUS  Patient is a 80 y.o. female presenting with shortness of breath. The history is provided by a relative. The history is limited by the condition of the patient.  Shortness of Breath Severity:  Severe Onset quality:  Gradual Duration:  1 day Progression:  Worsening Chronicity:  Recurrent Relieved by:  None tried Worsened by:  Nothing tried  Patient presents from nursing facility for worsening SOB She has also had increased altered mental status Pt just had inpatient stay and was found to be c dif positive and is on current therapy  Pt is unable to provide any details due to altered mental status  Family is at bedside Pt is a DNR  Past Medical History  Diagnosis Date  . Renal insufficiency   . Hypertension   . A-fib   . Anxiety   . Depression   . Hypothyroidism   . Inguinal hernia     repaired 2011, right  . Osteoarthritis   . Cerebral vascular accident   . DJD (degenerative joint disease)   . Recurrent UTI   . C. difficile colitis     recurrent  . Dementia   . Anemia   . Cataract   . Asteatotic eczema   . Peripheral vascular disease   . Hyponatremia   . Malnutrition   . Sepsis(995.91)   . TIA (transient ischemic attack)   . Urinary incontinence   . Congestive heart failure, unspecified 05/04/2014   Past Surgical History  Procedure Laterality Date  . Inguinal hernia repair    . Bladder surgery      prolapsed bladder  . Cataract extraction      bilateral   Family History  Problem Relation Age of Onset  . Kidney Stones Mother   . Colon cancer Neg Hx    History  Substance Use Topics  . Smoking status: Former Smoker    Types: Cigarettes    Quit date: 10/17/1960  . Smokeless tobacco: Never Used  . Alcohol Use: No   OB  History    No data available     Review of Systems  Unable to perform ROS: Mental status change  Respiratory: Positive for shortness of breath.       Allergies  Penicillins  Home Medications   Prior to Admission medications   Medication Sig Start Date End Date Taking? Authorizing Provider  acetaminophen (TYLENOL) 500 MG tablet Take 500 mg by mouth 2 (two) times daily.   Yes Historical Provider, MD  aspirin 81 MG chewable tablet Chew 81 mg by mouth every morning.   Yes Historical Provider, MD  citalopram (CELEXA) 10 MG tablet Take 10 mg by mouth every morning.   Yes Historical Provider, MD  diltiazem (CARDIZEM CD) 120 MG 24 hr capsule Take 1 capsule (120 mg total) by mouth daily with breakfast. 08/15/14  Yes Penny Piarlando Vega, MD  ipratropium-albuterol (DUONEB) 0.5-2.5 (3) MG/3ML SOLN Take 3 mLs by nebulization every 6 (six) hours as needed (for congestion).   Yes Historical Provider, MD  iron polysaccharides (NIFEREX) 150 MG capsule Take 150 mg by mouth 2 (two) times daily.   Yes Historical Provider, MD  lacosamide (VIMPAT) 50 MG TABS tablet Take 50 mg by mouth 2 (two) times  daily.   Yes Historical Provider, MD  levothyroxine (SYNTHROID, LEVOTHROID) 200 MCG tablet Take 200 mcg by mouth daily before breakfast.   Yes Historical Provider, MD  mirtazapine (REMERON) 7.5 MG tablet Take 7.5 mg by mouth at bedtime.   Yes Historical Provider, MD  Multiple Vitamins-Minerals (CERTAGEN PO) Take 1 tablet by mouth every morning.   Yes Historical Provider, MD  saccharomyces boulardii (FLORASTOR) 250 MG capsule Take 250 mg by mouth daily.   Yes Historical Provider, MD  vancomycin (VANCOCIN) 125 MG capsule Take 125 mg by mouth every 6 (six) hours.   Yes Historical Provider, MD  vitamin B-12 (CYANOCOBALAMIN) 1000 MCG tablet Take 1,000 mcg by mouth daily.   Yes Historical Provider, MD  bisacodyl (DULCOLAX) 10 MG suppository Place 10 mg rectally See admin instructions. Every 3 days as needed for no bowl  movement    Historical Provider, MD  feeding supplement, ENSURE, (ENSURE) PUDG Take 1 Container by mouth 4 (four) times daily - after meals and at bedtime. 08/15/14   Penny Piarlando Vega, MD  fluticasone (FLONASE) 50 MCG/ACT nasal spray Place 1 spray into the nose at bedtime.     Historical Provider, MD  hydroxypropyl methylcellulose (ISOPTO TEARS) 2.5 % ophthalmic solution Place 1 drop into both eyes 4 (four) times daily as needed.    Historical Provider, MD  metroNIDAZOLE (FLAGYL) 500 MG tablet Take 1 tablet (500 mg total) by mouth every 8 (eight) hours. Patient not taking: Reported on 09/09/2014 08/15/14   Penny Piarlando Vega, MD  Protein (PROCEL) POWD Take 1 scoop by mouth 3 (three) times daily.    Historical Provider, MD  sennosides-docusate sodium (SENOKOT-S) 8.6-50 MG tablet Take 1 tablet by mouth 2 (two) times daily.    Historical Provider, MD   BP 170/97 mmHg  Pulse 80  Resp 31  Ht 5\' 5"  (1.651 m)  Wt 96 lb (43.545 kg)  BMI 15.98 kg/m2  SpO2 88% Physical Exam CONSTITUTIONAL: frail, elderly, distress noted HEAD: Normocephalic/atraumatic EYES: EOMI ENMT: Mucous membranes dry NECK: supple no meningeal signs SPINE:entire spine nontender CV: tachycardic, irregular LUNGS: tachypneic, coarse BS noted bilaterally ABDOMEN: soft GU:no cva tenderness NEURO: Pt is awake/alert,but she appears confused.  She will follow commands EXTREMITIES: pulses normal SKIN: warm, color normal PSYCH: unable to assess  ED Course  Procedures   CRITICAL CARE Performed by: Joya GaskinsWICKLINE,Brent Noto W Total critical care time: 35 Critical care time was exclusive of separately billable procedures and treating other patients. Critical care was necessary to treat or prevent imminent or life-threatening deterioration. Critical care was time spent personally by me on the following activities: development of treatment plan with patient and/or surrogate as well as nursing, discussions with consultants, evaluation of patient's  response to treatment, examination of patient, obtaining history from patient or surrogate, ordering and performing treatments and interventions, ordering and review of laboratory studies, ordering and review of radiographic studies, pulse oximetry and re-evaluation of patient's condition.  3:35 PM Pt with worsening SOB over past day She is ill appearing.  She is tachypneic Goals of care discussed with son/daughter They are POA She is a DNR They do not wish to proceed with intubation.  We agreed to treat with nebulizers and other supportive measures but no intubation 4:32 PM D/w el[facility] with [facility] Plan is to continue on BIPAP, treat pneumonia and admit to stepdown Confirmed again with son that patient is not to be intubated at this time, continue BIPAP Labs Review Labs Reviewed  BASIC METABOLIC PANEL - Abnormal; Notable for  the following:    Glucose, Bld 110 (*)    GFR calc non Af Amer 76 (*)    GFR calc Af Amer 88 (*)    All other components within normal limits  CBC WITH DIFFERENTIAL - Abnormal; Notable for the following:    RDW 17.3 (*)    Neutrophils Relative % 88 (*)    Lymphocytes Relative 7 (*)    Lymphs Abs 0.5 (*)    All other components within normal limits    Imaging Review Dg Chest Portable 1 View  08/24/2014   CLINICAL DATA:  Altered mental status, respiratory distress; history of pneumonia and C difficile infection  EXAM: PORTABLE CHEST - 1 VIEW  COMPARISON:  Portable chest x-ray of August 11, 2014  FINDINGS: There are confluent bilateral interstitial and alveolar infiltrates. There are bilateral pleural effusions. The hemidiaphragms are obscured. The cardiac silhouette remains enlarged. The pulmonary vascularity is indistinct. The observed bony thorax is unremarkable.  IMPRESSION: New bilateral alveolar opacities are consistent with pneumonia possibly related to aspiration. There is underlying CHF. There is a new right pleural effusion and slight increase in the volume of  the small left pleural effusion.   Electronically Signed   By: David  [place]   On: 08/28/2014 16:02     EKG Interpretation   Date/Time:  Tuesday August 19 2014 14:59:34 EST Ventricular Rate:  119 PR Interval:    QRS Duration: 76 QT Interval:  365 QTC Calculation: 514 R Axis:   102 Text Interpretation:  Atrial fibrillation Right axis deviation Nonspecific  T abnormalities, diffuse leads Prolonged QT interval Confirmed by Bebe Shaggy   MD, Dorinda Hill ([id number]) on 09/04/2014 3:29:46 PM      MDM   Final diagnoses:  Dyspnea  HCAP (healthcare-associated pneumonia)  Acute respiratory failure with hypoxia    Nursing notes including past medical history and social history reviewed and considered in documentation xrays reviewed and considered Labs/vital reviewed and considered Previous records reviewed and considered     Joya Gaskins, MD 08/26/2014 [phone]

## 2014-08-19 NOTE — ED Notes (Addendum)
80 yo from SNF with altered mental status secondary to respiratory distress. Per EMS pt recently d/c with PNA and C. Diff started on Vancomycin yesterday. Decreased lung sounds with rhonchi, Saturation of 88% on NRB. Hx of A.fib rate of 120-160 bpm, 170/110 BP with cbg 116. DNR status. Responds to pain but no verbal stimuli.

## 2014-08-19 NOTE — ED Notes (Signed)
Xcel EnergyCacky Barefoot PA, communication about hx of pt and hospice consultation.

## 2014-08-19 NOTE — ED Notes (Signed)
Respiratory called

## 2014-08-19 NOTE — ED Notes (Signed)
Family at bedside. 

## 2014-08-19 NOTE — H&P (Signed)
[facility] History and Physical  [name] [name] ZOX:[id number] DOB: 02/01/1924 DOA: 09/03/2014  Referring physician: Dr. Bebe Shaggy PCP: [name] Grout, [name] [name]   Chief Complaint: shortness of breath  HPI: [name] [name] is a 80 y.o. female with past medical history of C. Difficile, adult failure to thrive, dysphagia, atrial fibrillation and end-stage dementia was recently discharged in the hospital on 10/30. Patient brought back to the hospital today because of shortness of breath and lethargy. All of the information was obtained from family at bedside as well as EDP notes, patient is lethargic, responsive to painful stimuli, she is on BiPAP. Family stated that she was doing okay on Friday have the day of discharge then Saturday she started to cough, start to be more lethargic and this is worsened to the point they brought to the hospital today. Per family they suspected that nursing home is not giving her thickened liquids as she suppose to have. In the ED evaluation showed extensive bibasilar pneumonia and CHF, surprisingly her blood work looks okay without leukocytosis or renal failure.  Review of Systems:  No ROS is obtainable because of the patient mental status.  Past Medical History  Diagnosis Date  . Renal insufficiency   . Hypertension   . A-fib   . Anxiety   . Depression   . Hypothyroidism   . Inguinal hernia     repaired 2011, right  . Osteoarthritis   . Cerebral vascular accident   . DJD (degenerative joint disease)   . Recurrent UTI   . C. difficile colitis     recurrent  . Dementia   . Anemia   . Cataract   . Asteatotic eczema   . Peripheral vascular disease   . Hyponatremia   . Malnutrition   . Sepsis(995.91)   . TIA (transient ischemic attack)   . Urinary incontinence   . Congestive heart failure, unspecified 05/04/2014   Past Surgical History  Procedure Laterality Date  . Inguinal hernia repair    . Bladder surgery      prolapsed bladder  . Cataract  extraction      bilateral   Social History:   reports that she quit smoking about 53 years ago. Her smoking use included Cigarettes. She smoked 0.00 packs per day. She has never used smokeless tobacco. She reports that she does not drink alcohol or use illicit drugs.  Allergies  Allergen Reactions  . Penicillins     "red faced" and on MAR    Family History  Problem Relation Age of Onset  . Kidney Stones Mother   . Colon cancer Neg Hx      Prior to Admission medications   Medication Sig Start Date End Date Taking? Authorizing Provider  acetaminophen (TYLENOL) 500 MG tablet Take 500 mg by mouth 2 (two) times daily.   Yes Historical Provider, [name] [name]  aspirin 81 MG chewable tablet Chew 81 mg by mouth every morning.   Yes Historical Provider, [name] [name]  citalopram (CELEXA) 10 MG tablet Take 10 mg by mouth every morning.   Yes Historical Provider, [name] [name]  diltiazem (CARDIZEM CD) 120 MG 24 hr capsule Take 1 capsule (120 mg total) by mouth daily with breakfast. 08/15/14  Yes Penny Pia, [name] [name]  ipratropium-albuterol (DUONEB) 0.5-2.5 (3) MG/3ML SOLN Take 3 mLs by nebulization every 6 (six) hours as needed (for congestion).   Yes Historical Provider, [name] [name]  iron polysaccharides (NIFEREX) 150 MG capsule Take 150 mg by mouth 2 (two) times daily.   Yes Historical Provider, [name] [name]  lacosamide (VIMPAT) 50 MG TABS tablet Take 50 mg by mouth 2 (two) times daily.   Yes Historical Provider, [name] [name]  levothyroxine (SYNTHROID, LEVOTHROID) 200 MCG tablet Take 200 mcg by mouth daily before breakfast.   Yes Historical Provider, [name] [name]  mirtazapine (REMERON) 7.5 MG tablet Take 7.5 mg by mouth at bedtime.   Yes Historical Provider, [name] [name]  Multiple Vitamins-Minerals (CERTAGEN PO) Take 1 tablet by mouth every morning.   Yes Historical Provider, [name] [name]  saccharomyces boulardii (FLORASTOR) 250 MG capsule Take 250 mg by mouth daily.   Yes Historical Provider, [name] [name]  vancomycin (VANCOCIN) 125 MG capsule Take 125 mg by mouth every 6 (six) hours.   Yes  Historical Provider, [name] [name]  vitamin B-12 (CYANOCOBALAMIN) 1000 MCG tablet Take 1,000 mcg by mouth daily.   Yes Historical Provider, [name] [name]  bisacodyl (DULCOLAX) 10 MG suppository Place 10 mg rectally See admin instructions. Every 3 days as needed for no bowl movement    Historical Provider, [name] [name]  feeding supplement, ENSURE, (ENSURE) PUDG Take 1 Container by mouth 4 (four) times daily - after meals and at bedtime. 08/15/14   Penny Piarlando Vega, [name] [name]  fluticasone (FLONASE) 50 MCG/ACT nasal spray Place 1 spray into the nose at bedtime.     Historical Provider, [name] [name]  hydroxypropyl methylcellulose (ISOPTO TEARS) 2.5 % ophthalmic solution Place 1 drop into both eyes 4 (four) times daily as needed.    Historical Provider, [name] [name]  metroNIDAZOLE (FLAGYL) 500 MG tablet Take 1 tablet (500 mg total) by mouth every 8 (eight) hours. Patient not taking: Reported on 09/06/2014 08/15/14   Penny Piarlando Vega, [name] [name]  Protein (PROCEL) POWD Take 1 scoop by mouth 3 (three) times daily.    Historical Provider, [name] [name]  sennosides-docusate sodium (SENOKOT-S) 8.6-50 MG tablet Take 1 tablet by mouth 2 (two) times daily.    Historical Provider, [name] [name]   Physical Exam: Filed Vitals:   08/26/2014 1832  BP: 125/92  Pulse: 133  Resp: 28   Constitutional: Oriented to person, place, and time. Well-developed and well-nourished. Cooperative.  Head: Normocephalic and atraumatic.  Nose: Nose normal.  Mouth/Throat: Uvula is midline, oropharynx is clear and moist and mucous membranes are normal.  Eyes: Conjunctivae and EOM are normal. Pupils are equal, round, and reactive to light.  Neck: Trachea normal and normal range of motion. Neck supple.  Cardiovascular: Normal rate, regular rhythm, S1 normal, S2 normal, normal heart sounds and intact distal pulses.   Pulmonary/Chest: Effort normal and breath sounds normal.  Abdominal: Soft. Bowel sounds are normal. There is no hepatosplenomegaly. There is no tenderness.  Musculoskeletal: Normal range of motion.  Neurological:  Alert and oriented to person, place, and time. Has normal strength. No cranial nerve deficit or sensory deficit.  Skin: Skin is warm, dry and intact.  Psychiatric: Has a normal mood and affect. Speech is normal and behavior is normal.   Labs on Admission:  Basic Metabolic Panel:  Recent Labs Lab 08/13/14 0400 08/14/14 0454 08/15/14 1113 09/06/2014 1450  NA 152* 150* 142 143  K 4.9 4.8 4.1 4.0  CL 121* 119* 111 108  CO2 24 23 23 24   GLUCOSE 93 84 89 110*  BUN 26* 20 14 10   CREATININE 0.76 0.77 0.66 0.67  CALCIUM 9.5 9.4 8.9 9.6   Liver Function Tests: No results for input(s): AST, ALT, ALKPHOS, BILITOT, PROT, ALBUMIN in the last 168 hours. No results for input(s): LIPASE, AMYLASE in the last 168 hours. No results for input(s): AMMONIA in the last 168 hours. CBC:  Recent  Labs Lab 08/13/14 0400 04/09/14 1450  WBC 9.1 7.1  NEUTROABS  --  6.2  HGB 11.5* 12.9  HCT 38.4 40.2  MCV 97.7 93.5  PLT 157 281   Cardiac Enzymes: No results for input(s): CKTOTAL, CKMB, CKMBINDEX, TROPONINI in the last 168 hours.  BNP (last 3 results) No results for input(s): PROBNP in the last 8760 hours. CBG:  Recent Labs Lab 08/14/14 1637 08/14/14 2022 08/15/14 0735 08/15/14 1116 08/15/14 1639  GLUCAP 170* 75 97 82 125*    Radiological Exams on Admission: Dg Chest Portable 1 View  09/05/2014   CLINICAL DATA:  Altered mental status, respiratory distress; history of pneumonia and C difficile infection  EXAM: PORTABLE CHEST - 1 VIEW  COMPARISON:  Portable chest x-ray of August 11, 2014  FINDINGS: There are confluent bilateral interstitial and alveolar infiltrates. There are bilateral pleural effusions. The hemidiaphragms are obscured. The cardiac silhouette remains enlarged. The pulmonary vascularity is indistinct. The observed bony thorax is unremarkable.  IMPRESSION: New bilateral alveolar opacities are consistent with pneumonia possibly related to aspiration. There is underlying CHF. There  is a new right pleural effusion and slight increase in the volume of the small left pleural effusion.   Electronically Signed   By: David  [place]   On: Jun 21, 2014 16:02    EKG: Independently reviewed.   Assessment/Plan Principal Problem:   Acute respiratory failure with hypoxia Active Problems:   Hypothyroidism   Atrial fibrillation   Dysphagia   Severe protein-calorie malnutrition   HCAP (healthcare-associated pneumonia)   Acute CHF   Sepsis     Acute respiratory failure Respiratory distress with respiratory rate in the 30s, oxygen is 88% on nonrebreather mask. Patient placed on BiPAP. This is secondary to bibasilar pneumonia and CHF.  Healthcare associated pneumonia Bibasilar infiltrates consistent with pneumonia, healthcare associated pneumonia versus aspiration. Family reported that nursing home was not given thickened liquids. Patient started on vancomycin and Primaxin (she is allergic to penicillins)  Sepsis Present on admission, heart rate of 140, respiratory rate of 31 with presence of pneumonia. Blood cultures obtained in the emergency department, urinalysis to be done. This is likely secondary to pneumonia.  C. Difficile Patient has history of recurrent C. Difficile, was recently diagnosed with a non-discharge on vancomycin. Continue oral vancomycin, likely can place NG tube for medications.  Atrial fibrillation Patient is on Cardizem, hold, not on anticoagulation because of high risk of falls.  Dysphagia Currently nothing by mouth.  Goals of care  Had discussion with daughter [name] PoundDeborah and son [name] FavreClarence at bedside. Both agreed to DNR/DNI, they wanted patient to get antibiotics and other noninvasive treatment modalities for now. Explained to them even with antibiotics, patient might not do very well. Reassess within 24 hours, if patient not making progress, likely to benefit from palliative care consult for goals of care.  Code Status: DNR/DNI Family  Communication: daughter [name] PoundDeborah and son [name] FavreClarence at bedside. Disposition Plan: stepdown  Time spent: 70 minutes   [facility] HealthELMAHI,Shenise Wolgamott A [facility] Pager [phone]

## 2014-08-19 NOTE — ED Notes (Signed)
MD at bedside. Admitting  

## 2014-08-20 LAB — COMPREHENSIVE METABOLIC PANEL
ALBUMIN: 2.3 g/dL — AB (ref 3.5–5.2)
ALK PHOS: 42 U/L (ref 39–117)
ALT: 14 U/L (ref 0–35)
AST: 15 U/L (ref 0–37)
Anion gap: 11 (ref 5–15)
BUN: 10 mg/dL (ref 6–23)
CALCIUM: 9.2 mg/dL (ref 8.4–10.5)
CO2: 24 mEq/L (ref 19–32)
Chloride: 107 mEq/L (ref 96–112)
Creatinine, Ser: 0.66 mg/dL (ref 0.50–1.10)
GFR calc Af Amer: 89 mL/min — ABNORMAL LOW (ref >90)
GFR calc non Af Amer: 77 mL/min — ABNORMAL LOW (ref >90)
Glucose, Bld: 110 mg/dL — ABNORMAL HIGH (ref 70–99)
POTASSIUM: 3.4 meq/L — AB (ref 3.7–5.3)
SODIUM: 142 meq/L (ref 137–147)
Total Bilirubin: 0.6 mg/dL (ref 0.3–1.2)
Total Protein: 5.7 g/dL — ABNORMAL LOW (ref 6.0–8.3)

## 2014-08-20 LAB — CBC
HEMATOCRIT: 33.6 % — AB (ref 36.0–46.0)
Hemoglobin: 10.8 g/dL — ABNORMAL LOW (ref 12.0–15.0)
MCH: 29.9 pg (ref 26.0–34.0)
MCHC: 32.1 g/dL (ref 30.0–36.0)
MCV: 93.1 fL (ref 78.0–100.0)
Platelets: 221 10*3/uL (ref 150–400)
RBC: 3.61 MIL/uL — ABNORMAL LOW (ref 3.87–5.11)
RDW: 17 % — ABNORMAL HIGH (ref 11.5–15.5)
WBC: 10.1 10*3/uL (ref 4.0–10.5)

## 2014-08-20 LAB — URINALYSIS, ROUTINE W REFLEX MICROSCOPIC
Bilirubin Urine: NEGATIVE
Glucose, UA: NEGATIVE mg/dL
Hgb urine dipstick: NEGATIVE
Ketones, ur: NEGATIVE mg/dL
LEUKOCYTES UA: NEGATIVE
NITRITE: NEGATIVE
PROTEIN: NEGATIVE mg/dL
Specific Gravity, Urine: 1.015 (ref 1.005–1.030)
Urobilinogen, UA: 0.2 mg/dL (ref 0.0–1.0)
pH: 5.5 (ref 5.0–8.0)

## 2014-08-20 MED ORDER — LEVOTHYROXINE SODIUM 100 MCG IV SOLR
100.0000 ug | Freq: Every day | INTRAVENOUS | Status: DC
  Administered 2014-08-20 – 2014-08-24 (×5): 100 ug via INTRAVENOUS
  Filled 2014-08-20 (×7): qty 5

## 2014-08-20 MED ORDER — POTASSIUM CHLORIDE 10 MEQ/100ML IV SOLN
10.0000 meq | INTRAVENOUS | Status: AC
  Administered 2014-08-20 (×4): 10 meq via INTRAVENOUS
  Filled 2014-08-20: qty 100

## 2014-08-20 MED ORDER — CHLORHEXIDINE GLUCONATE 0.12 % MT SOLN
15.0000 mL | Freq: Two times a day (BID) | OROMUCOSAL | Status: DC
  Administered 2014-08-20 – 2014-08-24 (×10): 15 mL via OROMUCOSAL
  Filled 2014-08-20 (×13): qty 15

## 2014-08-20 MED ORDER — SODIUM CHLORIDE 0.9 % IV SOLN
INTRAVENOUS | Status: DC
  Administered 2014-08-22: 20:00:00 via INTRAVENOUS

## 2014-08-20 MED ORDER — IPRATROPIUM-ALBUTEROL 0.5-2.5 (3) MG/3ML IN SOLN: 3.0000 mL | Freq: Four times a day (QID) | RESPIRATORY_TRACT | Status: DC | PRN

## 2014-08-20 MED ORDER — CETYLPYRIDINIUM CHLORIDE 0.05 % MT LIQD
7.0000 mL | Freq: Two times a day (BID) | OROMUCOSAL | Status: DC
  Administered 2014-08-20 – 2014-08-24 (×10): 7 mL via OROMUCOSAL

## 2014-08-20 NOTE — Progress Notes (Signed)
Pt's HR is now 130s-160s (Afib). NP notified. New orders received to initiate cardizem gtt. Will carry out orders and continue to monitor.

## 2014-08-20 NOTE — Progress Notes (Signed)
UR completed 

## 2014-08-20 NOTE — Progress Notes (Signed)
INITIAL NUTRITION ASSESSMENT  DOCUMENTATION CODES Per approved criteria  -Severe malnutrition in the context of chronic illness -Underweight   Pt meets criteria for severe MALNUTRITION in the context of chronic illness as evidenced by 4% weight loss.  INTERVENTION:  Diet advancement as able per MD.  Will add supplements (Ensure Pudding TID between meals and Magic Cups TID with meals) once diet is advanced.  NUTRITION DIAGNOSIS: Inadequate oral intake related to inability to eat as evidenced by NPO status.   Goal: Intake to meet >90% of estimated nutrition needs.  Monitor:  Diet advancement, PO intake, labs, weight trend.  Reason for Assessment: MST  80 y.o. female  Admitting Dx: Acute respiratory failure with hypoxia  ASSESSMENT: 80 y.o. female with past medical history of C. Difficile, adult failure to thrive, dysphagia, atrial fibrillation and end-stage dementia was recently discharged in the hospital on 10/30. Patient brought back to the hospital today because of shortness of breath and lethargy.  Patient familiar from recent admission last week. At that time she had been eating poorly due to PNA diagnosis. Was receiving a dysphagia 1 diet with nectar thick liquids. Intake was poor. Likes buttermilk and V8 juice.   Nutrition Focused Physical Exam:  Subcutaneous Fat:  Orbital Region: mild depletion Upper Arm Region: WNL Thoracic and Lumbar Region: NA  Muscle:  Temple Region: moderate depletion Clavicle Bone Region: severe depletion Clavicle and Acromion Bone Region: severe depletion Scapular Bone Region: NA Dorsal Hand: WNL Patellar Region: severe depletion Anterior Thigh Region: severe depletion Posterior Calf Region: severe depletion  Edema: none  Height: Ht Readings from Last 1 Encounters:  08/20/14 5\' 2"  (1.575 m)    Weight: Wt Readings from Last 1 Encounters:  08/20/14 92 lb 2.4 oz (41.8 kg)    Ideal Body Weight: 50 kg  % Ideal Body Weight:  84%  Wt Readings from Last 10 Encounters:  08/20/14 92 lb 2.4 oz (41.8 kg)  08/14/14 96 lb (43.545 kg)  04/23/14 97 lb 7.1 oz (44.2 kg)  06/11/13 101 lb 12.8 oz (46.176 kg)  05/20/13 103 lb 9.6 oz (46.993 kg)  08/14/12 115 lb (52.164 kg)  02/17/12 120 lb 9.6 oz (54.704 kg)  10/24/11 119 lb 7.8 oz (54.2 kg)  09/20/11 111 lb 9.6 oz (50.621 kg)  12/02/10 107 lb 3.2 oz (48.626 kg)    Usual Body Weight: 96 lb (1 week ago)  % Usual Body Weight: 96%  BMI:  Body mass index is 16.85 kg/(m^2). underweight  Estimated Nutritional Needs: Kcal: 1200-1400 Protein: 70-80 gm Fluid: 1.2-1.4 L  Skin: stage 2 pressure ulcer to sacrum  Diet Order: Diet NPO time specified  EDUCATION NEEDS: -Education not appropriate at this time   Intake/Output Summary (Last 24 hours) at 08/20/14 1429 Last data filed at 08/20/14 0700  Gross per 24 hour  Intake    218 ml  Output    352 ml  Net   -134 ml    Last BM: 11/3   Labs:   Recent Labs Lab 08/15/14 1113 2013/10/18 1450 08/20/14 0240  NA 142 143 142  K 4.1 4.0 3.4*  CL 111 108 107  CO2 23 24 24   BUN 14 10 10   CREATININE 0.66 0.67 0.66  CALCIUM 8.9 9.6 9.2  GLUCOSE 89 110* 110*    CBG (last 3)  No results for input(s): GLUCAP in the last 72 hours.  Scheduled Meds: . antiseptic oral rinse  7 mL Mouth Rinse q12n4p  . chlorhexidine  15 mL Mouth  Rinse BID  . heparin  5,000 Units Subcutaneous 3 times per day  . imipenem-cilastatin  250 mg Intravenous Q12H  . lacosamide (VIMPAT) IV  50 mg Intravenous Q12H  . levothyroxine  200 mcg Oral QAC breakfast  . sodium chloride  3 mL Intravenous Q12H    Continuous Infusions: . diltiazem (CARDIZEM) infusion 10 mg (08/20/14 0700)    Past Medical History  Diagnosis Date  . Renal insufficiency   . Hypertension   . A-fib   . Anxiety   . Depression   . Hypothyroidism   . Inguinal hernia     repaired 2011, right  . Osteoarthritis   . Cerebral vascular accident   . DJD (degenerative  joint disease)   . Recurrent UTI   . C. difficile colitis     recurrent  . Dementia   . Anemia   . Cataract   . Asteatotic eczema   . Peripheral vascular disease   . Hyponatremia   . Malnutrition   . Sepsis(995.91)   . TIA (transient ischemic attack)   . Urinary incontinence   . Congestive heart failure, unspecified 05/04/2014    Past Surgical History  Procedure Laterality Date  . Inguinal hernia repair    . Bladder surgery      prolapsed bladder  . Cataract extraction      bilateral    Joaquin CourtsKimberly Harris, RD, LDN, CNSC Pager [phone] After Hours Pager [phone]

## 2014-08-20 NOTE — Plan of Care (Signed)
Problem: Phase I Progression Outcomes Goal: Dyspnea controlled at rest Outcome: Progressing     

## 2014-08-20 NOTE — Progress Notes (Addendum)
[place] TEAM 1 - Stepdown/ICU TEAM Progress Note  [name] [name] GEX:[id number] DOB: [birth year] DOA: 09/04/2014 PCP: Oneal Grout, MD  Admit HPI / Brief Narrative: 80 y.o. female with past medical history of C. Difficile, adult failure to thrive, dysphagia, atrial fibrillation and end-stage dementia who was discharged from the hospital on 10/30. Patient was brought back to the hospital because of shortness of breath and lethargy.  Family stated that she was doing okay on the day of discharge then the following day she started to cough, became more lethargic and this then worsened to the point they brought to the hospital. Family stated they suspected that the nursing home was not giving her thickened liquids like she was supposed to have.  In the ED evaluation showed extensive bibasilar infiltrates and findings c/w CHF.  Her blood work looked okay without leukocytosis or renal failure.  HPI/Subjective: Pt is awake but is mumbling incoherently.  No family present at time of exam.  She does not appear to be uncomfortable.    Assessment/Plan:  Acute respiratory failure secondary to bibasilar pneumonia and ?pulmonary edema due to CHF - cont supportive care - f/u CXR in AM - check TTE for prognostic value   Healthcare associated pneumonia Bibasilar infiltrates consistent with pneumonia v/s pneumonitis - healthcare associated versus aspiration - family reported that nursing home was not giving thickened liquids - pt allergic to PCN - MRSA screen negative - will stop abx for now as pt is high risk for severe C diff colitis and focus tx on aspiration pneumonitis which is felt to be the most likely culprit - if sx worsen will have to consider resuming IV abx to cover for aspiration PNA  Sepsis heart rate of 140, respiratory rate of 31 with presence of pneumonia - UA unrevealing - sepsis physiology improving - follow   Recent C. Difficile colitis  has hx of recurrent C diff colitis - was d/c on  flagyl 10/30 w/ plan to complete 8 additional days of tx - no diarrhea at present - will hold tx until ability to take orals cleared up - if cleared for intake will resume oral tx - watch for diarrhea (none thus far)  Chronic Atrial fibrillation w/ acute RVR Rate currently well controlled - not on anticoagulation because of high risk of falls  Dysphagia Currently nothing by mouth - SLP eval pending   Severe dementia  Presently alert but confused  Hypokalemia  Replace via IV and follow - check Mg level   Normocytic anemia   Severe malnutrition in context of chronic illness - underweight  Hypothyroid  Cont usual synthroid via IV dosing  Goals of care  Admitting MD had discussion with daughter Gavin Pound and son Marilu Favre at bedside - both agreed to DNR/DNI, they wanted patient to get antibiotics and other noninvasive treatment modalities - explained to them even with antibiotics, patient might not do very well.  Code Status: FULL Family Communication: no family present at time of exam Disposition Plan: SDU  Consultants: none  Procedures: none  Antibiotics: primaxin 11/03 > 11/4 vanc IV 11/03 aztreonam 11/03  DVT prophylaxis: SQ heparin   Objective: Blood pressure 119/71, pulse 92, temperature 98.1 F (36.7 C), temperature source Axillary, resp. rate 19, height 5\' 2"  (1.575 m), weight 41.8 kg (92 lb 2.4 oz), SpO2 95 %.  Intake/Output Summary (Last 24 hours) at 08/20/14 1539 Last data filed at 08/20/14 0700  Gross per 24 hour  Intake    218 ml  Output  352 ml  Net   -134 ml    Exam: General: No acute respiratory distress Lungs: poor air movement in B bases w/ faint crackles - no wheeze  Cardiovascular: Regular rate without murmur gallop or rub  Abdomen: Nontender, nondistended, soft, bowel sounds positive, no rebound, no ascites, no appreciable mass Extremities: No significant cyanosis, clubbing, or edema bilateral lower extremities  Data Reviewed: Basic  Metabolic Panel:  Recent Labs Lab 08/14/14 0454 08/15/14 1113 08/23/2014 1450 08/20/14 0240  NA 150* 142 143 142  K 4.8 4.1 4.0 3.4*  CL 119* 111 108 107  CO2 23 23 24 24   GLUCOSE 84 89 110* 110*  BUN 20 14 10 10   CREATININE 0.77 0.66 0.67 0.66  CALCIUM 9.4 8.9 9.6 9.2    Liver Function Tests:  Recent Labs Lab 08/20/14 0240  AST 15  ALT 14  ALKPHOS 42  BILITOT 0.6  PROT 5.7*  ALBUMIN 2.3*   CBC:  Recent Labs Lab 09/04/2014 1450 08/20/14 0240  WBC 7.1 10.1  NEUTROABS 6.2  --   HGB 12.9 10.8*  HCT 40.2 33.6*  MCV 93.5 93.1  PLT 281 221   CBG:  Recent Labs Lab 08/14/14 1637 08/14/14 2022 08/15/14 0735 08/15/14 1116 08/15/14 1639  GLUCAP 170* 75 97 82 125*    Recent Results (from the past 240 hour(s))  Blood culture (routine x 2)     Status: None   Collection Time: 08/11/14 12:51 PM  Result Value Ref Range Status   Specimen Description BLOOD RIGHT FOREARM  Final   Special Requests BOTTLES DRAWN AEROBIC AND ANAEROBIC 4CC  Final   Culture  Setup Time   Final    08/11/2014 18:16 Performed at [facility]    Culture   Final    NO GROWTH 5 DAYS Performed at [facility]    Report Status 08/17/2014 FINAL  Final  Blood culture (routine x 2)     Status: None   Collection Time: 08/11/14  1:20 PM  Result Value Ref Range Status   Specimen Description BLOOD LEFT HAND  Final   Special Requests BOTTLES DRAWN AEROBIC AND ANAEROBIC 5CC  Final   Culture  Setup Time   Final    08/11/2014 18:16 Performed at [facility]    Culture   Final    NO GROWTH 5 DAYS Performed at [facility]    Report Status 08/17/2014 FINAL  Final  Urine culture     Status: None   Collection Time: 08/11/14  2:53 PM  Result Value Ref Range Status   Specimen Description URINE, CATHETERIZED  Final   Special Requests NONE  Final   Culture  Setup Time   Final    08/10/2014 16:35 Performed at [facility]   Colony Count NO GROWTH Performed  at [facility]  Final   Culture NO GROWTH Performed at [facility]  Final   Report Status 08/12/2014 FINAL  Final  MRSA PCR Screening     Status: None   Collection Time: 08/12/14  5:28 AM  Result Value Ref Range Status   MRSA by PCR NEGATIVE NEGATIVE Final    Comment:        The GeneXpert MRSA Assay (FDA approved for NASAL specimens only), is one component of a comprehensive MRSA colonization surveillance program. It is not intended to diagnose MRSA infection nor to guide or monitor treatment for MRSA infections.  Clostridium Difficile by PCR     Status:  Abnormal   Collection Time: 08/12/14  8:34 AM  Result Value Ref Range Status   C difficile by pcr POSITIVE (A) NEGATIVE Final    Comment: CRITICAL RESULT CALLED TO, READ BACK BY AND VERIFIED WITH: Quita SkyeV. JOHNSON RN 10:50 08/12/14 ([email or web address])     Studies:  Recent x-ray studies have been reviewed in detail by the Attending Physician  Scheduled Meds:  Scheduled Meds: . antiseptic oral rinse  7 mL Mouth Rinse q12n4p  . chlorhexidine  15 mL Mouth Rinse BID  . heparin  5,000 Units Subcutaneous 3 times per day  . imipenem-cilastatin  250 mg Intravenous Q12H  . lacosamide (VIMPAT) IV  50 mg Intravenous Q12H  . levothyroxine  200 mcg Oral QAC breakfast  . sodium chloride  3 mL Intravenous Q12H    Time spent on care of this patient: 35 mins   Izayah Miner T , MD   [facility] Office  [phone] Pager - Text Page per Loretha StaplerAmion as per below:  On-Call/Text Page:      [email or web address]      password [email or web address]  If 7PM-7AM, please contact night-coverage [email or web address] Password [email or web address] 08/20/2014, 3:39 PM   LOS: 1 day

## 2014-08-20 NOTE — Progress Notes (Signed)
Taken off BIPAP at this time for trial off. Pt tolerating well. RR 22, Spo2 100% on 55% VM. Will titrate as Spo2 allows and RT will continue to monitor.

## 2014-08-20 NOTE — Progress Notes (Signed)
Pt admitted from ED. Pt hooked up to tele monitor. HR 130s-140s (Afib). Pt on Bipap SpO2 99% RR 27. RT present at bedside. [facility] and [facility] notified of new admit. Pt's daughter and son at bedside. Will continue to monitor.

## 2014-08-20 NOTE — Progress Notes (Signed)
Chaplain responded to consult placed for pt. Daughter in the chair at bedside while pt is sleeping. Daughter was busy and requested I return later. Daughter expressed gratitude for the visit.   Gala RomneyBrown, Lisl Slingerland J, Chaplain 08/20/2014

## 2014-08-21 ENCOUNTER — Inpatient Hospital Stay (HOSPITAL_COMMUNITY): Payer: PRIVATE HEALTH INSURANCE

## 2014-08-21 DIAGNOSIS — IMO0001 Reserved for inherently not codable concepts without codable children: Secondary | ICD-10-CM | POA: Diagnosis not present

## 2014-08-21 DIAGNOSIS — F0390 Unspecified dementia without behavioral disturbance: Secondary | ICD-10-CM

## 2014-08-21 DIAGNOSIS — E039 Hypothyroidism, unspecified: Secondary | ICD-10-CM | POA: Diagnosis not present

## 2014-08-21 DIAGNOSIS — J690 Pneumonitis due to inhalation of food and vomit: Secondary | ICD-10-CM

## 2014-08-21 DIAGNOSIS — F03C0 Unspecified dementia, severe, without behavioral disturbance, psychotic disturbance, mood disturbance, and anxiety: Secondary | ICD-10-CM | POA: Diagnosis not present

## 2014-08-21 DIAGNOSIS — I482 Chronic atrial fibrillation, unspecified: Secondary | ICD-10-CM | POA: Insufficient documentation

## 2014-08-21 DIAGNOSIS — I272 Pulmonary hypertension, unspecified: Secondary | ICD-10-CM | POA: Diagnosis not present

## 2014-08-21 DIAGNOSIS — I270 Primary pulmonary hypertension: Secondary | ICD-10-CM

## 2014-08-21 DIAGNOSIS — A047 Enterocolitis due to Clostridium difficile: Secondary | ICD-10-CM

## 2014-08-21 DIAGNOSIS — A0472 Enterocolitis due to Clostridium difficile, not specified as recurrent: Secondary | ICD-10-CM | POA: Insufficient documentation

## 2014-08-21 DIAGNOSIS — R1314 Dysphagia, pharyngoesophageal phase: Secondary | ICD-10-CM

## 2014-08-21 DIAGNOSIS — E876 Hypokalemia: Secondary | ICD-10-CM

## 2014-08-21 LAB — COMPREHENSIVE METABOLIC PANEL
ALBUMIN: 2.6 g/dL — AB (ref 3.5–5.2)
ALT: 13 U/L (ref 0–35)
AST: 16 U/L (ref 0–37)
Alkaline Phosphatase: 45 U/L (ref 39–117)
Anion gap: 15 (ref 5–15)
BILIRUBIN TOTAL: 0.5 mg/dL (ref 0.3–1.2)
BUN: 15 mg/dL (ref 6–23)
CHLORIDE: 110 meq/L (ref 96–112)
CO2: 21 meq/L (ref 19–32)
CREATININE: 0.74 mg/dL (ref 0.50–1.10)
Calcium: 9.5 mg/dL (ref 8.4–10.5)
GFR calc Af Amer: 85 mL/min — ABNORMAL LOW (ref >90)
GFR calc non Af Amer: 74 mL/min — ABNORMAL LOW (ref >90)
Glucose, Bld: 76 mg/dL (ref 70–99)
POTASSIUM: 4.3 meq/L (ref 3.7–5.3)
Sodium: 146 mEq/L (ref 137–147)
Total Protein: 6.2 g/dL (ref 6.0–8.3)

## 2014-08-21 LAB — CBC
HCT: 35.6 % — ABNORMAL LOW (ref 36.0–46.0)
Hemoglobin: 11.4 g/dL — ABNORMAL LOW (ref 12.0–15.0)
MCH: 29.6 pg (ref 26.0–34.0)
MCHC: 32 g/dL (ref 30.0–36.0)
MCV: 92.5 fL (ref 78.0–100.0)
PLATELETS: 282 10*3/uL (ref 150–400)
RBC: 3.85 MIL/uL — AB (ref 3.87–5.11)
RDW: 17.6 % — ABNORMAL HIGH (ref 11.5–15.5)
WBC: 9.7 10*3/uL (ref 4.0–10.5)

## 2014-08-21 LAB — PROCALCITONIN: PROCALCITONIN: 4.45 ng/mL

## 2014-08-21 MED ORDER — IPRATROPIUM-ALBUTEROL 0.5-2.5 (3) MG/3ML IN SOLN
3.0000 mL | Freq: Four times a day (QID) | RESPIRATORY_TRACT | Status: DC | PRN
  Administered 2014-08-23: 3 mL via RESPIRATORY_TRACT
  Filled 2014-08-21: qty 3

## 2014-08-21 MED ORDER — IPRATROPIUM-ALBUTEROL 0.5-2.5 (3) MG/3ML IN SOLN: 3.0000 mL | Freq: Four times a day (QID) | RESPIRATORY_TRACT | Status: DC

## 2014-08-21 MED ORDER — METHYLPREDNISOLONE SODIUM SUCC 125 MG IJ SOLR
60.0000 mg | INTRAMUSCULAR | Status: DC
  Administered 2014-08-21 – 2014-08-23 (×3): 60 mg via INTRAVENOUS
  Filled 2014-08-21 (×3): qty 0.96

## 2014-08-21 NOTE — Evaluation (Signed)
Clinical/Bedside Swallow Evaluation Patient Details  Name: [name] BeringDorothy [name] MRN: [id number] Date of Birth: [birth year]  Today's Date: 08/21/2014 Time: [id number] SLP Time Calculation (min): 24 min  Past Medical History:  Past Medical History  Diagnosis Date  . Renal insufficiency   . Hypertension   . A-fib   . Anxiety   . Depression   . Hypothyroidism   . Inguinal hernia     repaired 2011, right  . Osteoarthritis   . Cerebral vascular accident   . DJD (degenerative joint disease)   . Recurrent UTI   . C. difficile colitis     recurrent  . Dementia   . Anemia   . Cataract   . Asteatotic eczema   . Peripheral vascular disease   . Hyponatremia   . Malnutrition   . Sepsis(995.91)   . TIA (transient ischemic attack)   . Urinary incontinence   . Congestive heart failure, unspecified 05/04/2014   Past Surgical History:  Past Surgical History  Procedure Laterality Date  . Inguinal hernia repair    . Bladder surgery      prolapsed bladder  . Cataract extraction      bilateral   HPI:  80 y.o. female with past medical history of C. Difficile, adult failure to thrive, dysphagia, atrial fibrillation and end-stage dementia who was discharged from the hospital on 10/30. Patient was brought back to the hospital because of shortness of breath and lethargy. Family stated that she was doing okay on the day of discharge then the following day she started to cough, became more lethargic and this then worsened to the point they brought to the hospital. Family reported to MD they suspected that the nursing home was not giving her thickened liquids like she was supposed to have.   Assessment / Plan / Recommendation Clinical Impression  AMS prohibiting administration of pos during bedside exam this am. Pateint lethargic, arousing with max SLP verbal and tactile cueing but unable to maintain appropriate levels of alertness for po trials. Oral care provided with minimal oral response to tactile  stimulation, primarily lip pursing. Discussed known risk of aspiration with daughter due to chronic dysphagia which would now likely be exacerbated by acute illness and changes in mentation. Recommend diagnostic treatment at bedside with po trials once alert and able to participate. Daughter in agreement. MD, may also wish to consider palliative care consult to discuss goals of care as daughter verbalizing desire for non-oral means of nutrition, etc.     Aspiration Risk  Severe    Diet Recommendation NPO   Medication Administration: Via alternative means    Other  Recommendations Oral Care Recommendations:  (QID)   Follow Up Recommendations   (TBD)    Frequency and Duration min 2x/week  1 week   Pertinent Vitals/Pain n/a     Swallow Study    General HPI: 80 y.o. female with past medical history of C. Difficile, adult failure to thrive, dysphagia, atrial fibrillation and end-stage dementia who was discharged from the hospital on 10/30. Patient was brought back to the hospital because of shortness of breath and lethargy. Family stated that she was doing okay on the day of discharge then the following day she started to cough, became more lethargic and this then worsened to the point they brought to the hospital. Family reported to MD they suspected that the nursing home was not giving her thickened liquids like she was supposed to have. Previous Swallow Assessment: 08/13/14, bedside swallow evaluation recommended dysphagia 1  diet with nectar thick liquids with risk of aspiration given baseline dementia.  Diet Prior to this Study: NPO Temperature Spikes Noted: Yes Respiratory Status: Nasal cannula History of Recent Intubation: No Behavior/Cognition: Confused;Lethargic Oral Cavity - Dentition: Edentulous Self-Feeding Abilities: Total assist Patient Positioning: Upright in bed Baseline Vocal Quality: Low vocal intensity Volitional Cough: Cognitively unable to elicit Volitional Swallow:  Unable to elicit    Oral/Motor/Sensory Function Overall Oral Motor/Sensory Function:  (unable to participate due to AMS)   Ice Chips Ice chips: Not tested   Thin Liquid Thin Liquid: Not tested    Nectar Thick Nectar Thick Liquid: Not tested   Honey Thick Honey Thick Liquid: Not tested   Puree Puree: Not tested   Solid   GO   Megan Hayduk MA, CCC-SLP [phone]  Solid: Not tested       Tameah Mihalko Meryl 08/21/2014,11:19 AM

## 2014-08-21 NOTE — Progress Notes (Signed)
Echocardiogram 2D Echocardiogram has been performed.  Dixie Coppa 08/21/2014, 9:24 AM

## 2014-08-21 NOTE — Clinical Social Work Psychosocial (Signed)
Clinical Social Work Department BRIEF PSYCHOSOCIAL ASSESSMENT 08/21/2014  Patient:  [name] [name], [name] [name]     Account Number:  [id number]     Admit date:  09/07/2014  Clinical Social Worker:  Marciano Sequin  Date/Time:  08/21/2014 12:33 PM  Referred by:  RN  Date Referred:  08/21/2014 Referred for  SNF Placement   Other Referral:   Interview type:  Patient Other interview type:   Pt is orient to self only. [name] [name] Daughter [phone]    PSYCHOSOCIAL DATA Living Status:  FACILITY Admitted from facility:  [name] Level of care:  Long Term Acute Primary support name:  Allred,Deborah Primary support relationship to patient:  CHILD, ADULT Degree of support available:   [place]  Other Concerns:    SOCIAL WORK ASSESSMENT / PLAN CSW met pt's daughter [name] [name] at the bedside. CSW introduced self and purpose of visit. [name] [name] confirmed that the pt is a long-term care pt at [facility]. [name] [name] reported that she will be pleased with the pt returning back. CSW and [name] [name] discussed the transition process. [name] [name] reported that she did not have any questions for the CSW. CSW provided [name] [name] with contact information for furthers questions.   Assessment/plan status:  Psychosocial Support/Ongoing Assessment of Needs Other assessment/ plan:   Information/referral to community resources:    PATIENT'S/FAMILY'S RESPONSE TO PLAN OF CARE: [name] [name] presented with a happy affect and joyful mood. [name] [name] reported being pleased with the care the pt received at [facility]. [name] [name] reported being ready for the pt to return once medically stable.   [name], MSW, [place]

## 2014-08-21 NOTE — Progress Notes (Signed)
[place] TEAM 1 - Stepdown/ICU TEAM Progress Note  [name] [name] VXB:[id number] DOB: Jan 23, 1924 DOA: 08/27/2014 PCP: Blanchie Serve, MD  Admit HPI / Brief Narrative: 80 y.o. WF PMHx Anxiety, Depression, Dementia, TIA, HTN, CHF, A-Fib, C. Difficile, adult failure to thrive, dysphagia, atrial fibrillation and end-stage dementia who was discharged from the hospital on 10/30. Patient was brought back to the hospital because of shortness of breath and lethargy. Family stated that she was doing okay on the day of discharge then the following day she started to cough, became more lethargic and this then worsened to the point they brought to the hospital. Family stated they suspected that the nursing home was not giving her thickened liquids like she was supposed to have.  In the ED evaluation showed extensive bibasilar infiltrates and findings c/w CHF. Her blood work looked okay without leukocytosis or renal failure  HPI/Subjective: 11/5 Talking nonsensically. Family states when feeling well gets around in wheel chair, feeds herself, and can answer questions appropriately  Assessment/Plan: Acute respiratory failure -multifactorial secondary to bibasilar pneumonia and ?pulmonary edema due to CHF - cont supportive care - f/u CXR in AM - check TTE for prognostic value   Healthcare associated pneumonia/pneumonitis -Bibasilar infiltrates consistent with pneumonia v/s pneumonitis - healthcare associated versus aspiration; family reported that nursing home was not giving thickened liquids  -continue to hold ABx  for now as pt is high risk for severe C diff colitis and focus tx on aspiration pneumonitis (discuss this decision at length with daughter and son) -start Sunday Medrol 60 mg daily -DuoNeb  Sepsis -Sepsis physiology resolving   Recent C. Difficile colitis  -has hx of recurrent C diff colitis - was d/c on flagyl 10/30 w/ plan to complete 8 additional days of tx - no diarrhea at present -  will hold tx until ability to take orals cleared up - if cleared for intake will resume oral tx - watch for diarrhea (none thus far)  Chronic Atrial fibrillation w/ acute RVR -Rate currently well controlled - not on anticoagulation because of high risk of falls  Pulmonary hypertension -control patient's BP; currently patient's BP would not tolerate any further medication.  Dysphagia -11/5 patient failed swallow study; continue nothing by mouth    Severe dementia  -Presently alert but confused  Hypokalemia  -Replace via IV and follow - check Mg level   Normocytic anemia  -stable  Severe malnutrition in context of chronic illness - underweight -were discussed with family in the a.m. placement NG tube, and consult nutrition for tube feedings  Hypothyroid  -Cont Synthroid IV 100 g daily  Goals of care  -Admitting MD had discussion with daughter [name] and son Braulio Conte at bedside - both agreed to DNR/DNI, they wanted patient to get antibiotics and other noninvasive treatment modalities  -Dr. Hilma Favors (palliative care) met with patient's family today we'll await recommendations    Code Status: DNR  Family Communication: no family present at time of exam Disposition Plan: SNF    Consultants: Dr. Rhea Pink (palliative care)    Procedure/Significant Events: 10/27 positive C.Diff by PCR 11/5 PCXR; No interval change diffuse bilateral airspace disease which may represent pneumonia.  11/5 echocardiogram; LVEF=55%- 60%. - Aortic valve: There was mild regurgitation. - Mitral valve:severe regurgitation. - Left atrium: moderately dilated. - Right atrium: moderately to severely dilated. - Tricuspid valve:  mild-moderate regurgitation. - Pulmonary arteries: PA peak pressure: 57 mm Hg (S).  Culture 11/3 Blood pending  Antibiotics: primaxin 11/03 > 11/4 vanc  IV 11/03>>stopped 11/5 aztreonam 11/03>>stopped 11/3   DVT prophylaxis: Subcutaneous  heparin   Devices    LINES / TUBES:      Continuous Infusions: . sodium chloride 50 mL/hr at 08/20/14 1748  . diltiazem (CARDIZEM) infusion 5 mg/hr (08/21/14 0600)    Objective: VITAL SIGNS: Temp: 97.4 F (36.3 C) (11/05 0250) Temp Source: Axillary (11/05 0250) BP: 143/89 mmHg (11/05 0528) Pulse Rate: 102 (11/05 0528) SPO2; FIO2:   Intake/Output Summary (Last 24 hours) at 08/21/14 0809 Last data filed at 08/21/14 0600  Gross per 24 hour  Intake    265 ml  Output    550 ml  Net   -285 ml     Exam: General: A/O x0, NAD, mild acute respiratory distress Lungs: Positive diffuse crackles, with mild wheezing  Cardiovascular: Regular rate and rhythm without murmur gallop or rub normal S1 and S2 Abdomen: Nontender, nondistended, soft, bowel sounds positive, no rebound, no ascites, no appreciable mass Extremities: No significant cyanosis, clubbing, or edema bilateral lower extremities  Data Reviewed: Basic Metabolic Panel:  Recent Labs Lab 08/15/14 1113 08/22/2014 1450 08/20/14 0240 08/21/14 0311  NA 142 143 142 146  K 4.1 4.0 3.4* 4.3  CL 111 108 107 110  CO2 _0 GLUCOSE 89 110* 110* 76  BUN _1 CREATININE 0.66 0.67 0.66 0.74  CALCIUM 8.9 9.6 9.2 9.5   Liver Function Tests:  Recent Labs Lab 08/20/14 0240 08/21/14 0311  AST 15 16  ALT 14 13  ALKPHOS 42 45  BILITOT 0.6 0.5  PROT 5.7* 6.2  ALBUMIN 2.3* 2.6*   No results for input(s): LIPASE, AMYLASE in the last 168 hours. No results for input(s): AMMONIA in the last 168 hours. CBC:  Recent Labs Lab 09/02/2014 1450 08/20/14 0240 08/21/14 0311  WBC 7.1 10.1 9.7  NEUTROABS 6.2  --   --   HGB 12.9 10.8* 11.4*  HCT 40.2 33.6* 35.6*  MCV 93.5 93.1 92.5  PLT 281 221 282   Cardiac Enzymes: No results for input(s): CKTOTAL, CKMB, CKMBINDEX, TROPONINI in the last 168 hours. BNP (last 3 results) No results for input(s): PROBNP in the last 8760 hours. CBG:  Recent Labs Lab  08/14/14 1637 08/14/14 2022 08/15/14 0735 08/15/14 1116 08/15/14 1639  GLUCAP 170* 75 97 82 125*    Recent Results (from the past 240 hour(s))  Blood culture (routine x 2)     Status: None   Collection Time: 08/11/14 12:51 PM  Result Value Ref Range Status   Specimen Description BLOOD RIGHT FOREARM  Final   Special Requests BOTTLES DRAWN AEROBIC AND ANAEROBIC 4CC  Final   Culture  Setup Time   Final    08/11/2014 18:16 Performed at [facility]    Culture   Final    NO GROWTH 5 DAYS Performed at [facility]    Report Status 08/17/2014 FINAL  Final  Blood culture (routine x 2)     Status: None   Collection Time: 08/11/14  1:20 PM  Result Value Ref Range Status   Specimen Description BLOOD LEFT HAND  Final   Special Requests BOTTLES DRAWN AEROBIC AND ANAEROBIC 5CC  Final   Culture  Setup Time   Final    08/11/2014 18:16 Performed at [facility]   Final    NO GROWTH 5 DAYS Performed at [facility]    Report Status 08/17/2014 FINAL  Final  Urine  culture     Status: None   Collection Time: 08/11/14  2:53 PM  Result Value Ref Range Status   Specimen Description URINE, CATHETERIZED  Final   Special Requests NONE  Final   Culture  Setup Time   Final    08/10/2014 16:35 Performed at [place] Performed at [facility]  Final   Culture NO GROWTH Performed at [facility]  Final   Report Status 08/12/2014 FINAL  Final  MRSA PCR Screening     Status: None   Collection Time: 08/12/14  5:28 AM  Result Value Ref Range Status   MRSA by PCR NEGATIVE NEGATIVE Final    Comment:        The GeneXpert MRSA Assay (FDA approved for NASAL specimens only), is one component of a comprehensive MRSA colonization surveillance program. It is not intended to diagnose MRSA infection nor to guide or monitor treatment for MRSA infections.  Clostridium Difficile by PCR     Status: Abnormal    Collection Time: 08/12/14  8:34 AM  Result Value Ref Range Status   C difficile by pcr POSITIVE (A) NEGATIVE Final    Comment: CRITICAL RESULT CALLED TO, READ BACK BY AND VERIFIED WITH: Lora Paula RN 10:50 08/12/14 ([email or web address])     Studies:  Recent x-ray studies have been reviewed in detail by the Attending Physician  Scheduled Meds:  Scheduled Meds: . antiseptic oral rinse  7 mL Mouth Rinse q12n4p  . chlorhexidine  15 mL Mouth Rinse BID  . heparin  5,000 Units Subcutaneous 3 times per day  . lacosamide (VIMPAT) IV  50 mg Intravenous Q12H  . levothyroxine  100 mcg Intravenous QAC breakfast  . sodium chloride  3 mL Intravenous Q12H    Time spent on care of this patient: 40 mins   Allie Bossier , MD   [facility] Office  [phone] Pager - [phone]  On-Call/Text Page:      [email or web address]      password [email or web address]  If 7PM-7AM, please contact night-coverage [email or web address] Password [email or web address] 08/21/2014, 8:09 AM   LOS: 2 days

## 2014-08-22 LAB — BLOOD GAS, ARTERIAL
Acid-base deficit: 4.2 mmol/L — ABNORMAL HIGH (ref 0.0–2.0)
Bicarbonate: 19.8 mEq/L — ABNORMAL LOW (ref 20.0–24.0)
O2 Content: 4 L/min
O2 Saturation: 94.5 %
Patient temperature: 98.6
TCO2: 20.8 mmol/L (ref 0–100)
pCO2 arterial: 33 mmHg — ABNORMAL LOW (ref 35.0–45.0)
pH, Arterial: 7.396 (ref 7.350–7.450)
pO2, Arterial: 77.5 mmHg — ABNORMAL LOW (ref 80.0–100.0)

## 2014-08-22 LAB — COMPREHENSIVE METABOLIC PANEL
ALT: 14 U/L (ref 0–35)
AST: 18 U/L (ref 0–37)
Albumin: 2.9 g/dL — ABNORMAL LOW (ref 3.5–5.2)
Alkaline Phosphatase: 50 U/L (ref 39–117)
Anion gap: 18 — ABNORMAL HIGH (ref 5–15)
BILIRUBIN TOTAL: 0.5 mg/dL (ref 0.3–1.2)
BUN: 25 mg/dL — AB (ref 6–23)
CHLORIDE: 110 meq/L (ref 96–112)
CO2: 19 meq/L (ref 19–32)
Calcium: 9.8 mg/dL (ref 8.4–10.5)
Creatinine, Ser: 0.76 mg/dL (ref 0.50–1.10)
GFR calc Af Amer: 85 mL/min — ABNORMAL LOW (ref >90)
GFR, EST NON AFRICAN AMERICAN: 73 mL/min — AB (ref >90)
GLUCOSE: 115 mg/dL — AB (ref 70–99)
Potassium: 4.5 mEq/L (ref 3.7–5.3)
Sodium: 147 mEq/L (ref 137–147)
Total Protein: 6.8 g/dL (ref 6.0–8.3)

## 2014-08-22 LAB — PROCALCITONIN: PROCALCITONIN: 2.93 ng/mL

## 2014-08-22 LAB — MAGNESIUM: Magnesium: 2.3 mg/dL (ref 1.5–2.5)

## 2014-08-22 LAB — LACTIC ACID, PLASMA: LACTIC ACID, VENOUS: 1.7 mmol/L (ref 0.5–2.2)

## 2014-08-22 NOTE — Progress Notes (Signed)
NUTRITION FOLLOW UP  Intervention:  - TPN per pharmacy vs TF.   - If TF warranted, recommend initiation of Osmolite 1.2 @ 20 ml/hr via NG tube and increase by 10 ml every 4 hours to goal rate of 45 ml/hr.   Tube feeding regimen provides 1296 kcal (100% of needs), 60 grams of protein, and 886 ml of H2O.   Nutrition Dx:   Inadequate oral intake related to inability to eat as evidenced by NPO, ongoing  Goal:   Pt to meet >/= 90% of their estimated nutrition needs   Monitor:   PO intake, initiation of nutrition support, labs, weight trend  Assessment:   80 y.o. female with past medical history of C. Difficile, adult failure to thrive, dysphagia, atrial fibrillation and end-stage dementia was recently discharged in the hospital on 10/30. Patient brought back to the hospital today because of shortness of breath and lethargy.  11/4: Patient familiar from recent admission last week. At that time she had been eating poorly due to PNA diagnosis. Was receiving a dysphagia 1 diet with nectar thick liquids. Intake was poor. Likes buttermilk and V8 juice.   11/6: - Received consult for new TNA/TPN.  - Pt's diet has been advanced to Dysphagia I and pt only ate 1-2 bites this afternoon.  - Family members are heading toward comfort care, but said that they want to make sure that they have tried everything possible, including nutrition support.  - Family was leaning toward initiation of TPN, but have now decided that they would rather have TF if possible so that pt does not have to have PICC placed. Spoke with MD who said that pt may not be candidate for placement of tube. IR consult ordered.  - Recommendations above if TF is able to be initiated.   Pt meets criteria for severe MALNUTRITION in the context of chronic illness as evidenced by 4% weight loss.  Height: Ht Readings from Last 1 Encounters:  08/20/14 5\' 2"  (1.575 m)    Weight Status:   Wt Readings from Last 1 Encounters:  08/22/14 94  lb 5.7 oz (42.8 kg)    Re-estimated needs:  Kcal: 1200-1400 Protein: 60-70 g Fluid: 1.2-1.4 L/day  Skin: stage II pressure ulcer on sacrum  Diet Order: DIET - DYS 1   Intake/Output Summary (Last 24 hours) at 08/22/14 1427 Last data filed at 08/22/14 1300  Gross per 24 hour  Intake   2565 ml  Output    175 ml  Net   2390 ml    Last BM: 11/6   Labs:   Recent Labs Lab 08/20/14 0240 08/21/14 0311 08/22/14 0226  NA 142 146 147  K 3.4* 4.3 4.5  CL 107 110 110  CO2 24 21 19   BUN 10 15 25*  CREATININE 0.66 0.74 0.76  CALCIUM 9.2 9.5 9.8  MG  --   --  2.3  GLUCOSE 110* 76 115*    CBG (last 3)  No results for input(s): GLUCAP in the last 72 hours.  Scheduled Meds: . antiseptic oral rinse  7 mL Mouth Rinse q12n4p  . chlorhexidine  15 mL Mouth Rinse BID  . heparin  5,000 Units Subcutaneous 3 times per day  . lacosamide (VIMPAT) IV  50 mg Intravenous Q12H  . levothyroxine  100 mcg Intravenous QAC breakfast  . methylPREDNISolone (SOLU-MEDROL) injection  60 mg Intravenous Q24H  . sodium chloride  3 mL Intravenous Q12H    Continuous Infusions: . sodium chloride 50 mL/hr  at 08/21/14 1900  . diltiazem (CARDIZEM) infusion 10 mg/hr (08/22/14 1300)    Emmaline KluverHaley Shenia Alan RD, LDN

## 2014-08-22 NOTE — Progress Notes (Signed)
Patient ID: [name] BeringDorothy [name], female   DOB: Oct 02, 1924, 80 y.o.   MRN: [id number]   Order received for percutaneous gastric tube placement Imaging has been reviewed by Dr Loreta AveWagner Does feel anatomy is appropriate for procedure IR PA will see pt over weekend and [facility]

## 2014-08-22 NOTE — Progress Notes (Signed)
Speech Language Pathology Treatment: Dysphagia  Patient Details Name: Laurena BeringDorothy Swader MRN: [id number] DOB: [birth year] Today's Date: 08/22/2014 Time: [id number] SLP Time Calculation (min): 36 min  Assessment / Plan / Recommendation Clinical Impression  Pt. awake with periods of lethargy but easily stimulated with verbal/tactile cues.  Immediate cough when nectar thick liquid was mildly thinner than nectar.  Cough not present with thickener added, however pt. has chronic dysphagia and penetration/aspiration cannot be completely eliminated despite po consistency.  As during previous visits pt's daughter understands aspiration risk and agrees to Dys 1 texture with nectar thick liquids via teaspoon, crush pills and full supervision with meals when alert.  Hopefully family agrees to Palliative care route as she will likely continue to develop respiratory difficulties and decreased responsiveness when at SNF.   HPI HPI: 80 y.o. female with past medical history of C. Difficile, adult failure to thrive, dysphagia, atrial fibrillation and end-stage dementia who was discharged from the hospital on 10/30. Patient was brought back to the hospital because of shortness of breath and lethargy. Family stated that she was doing okay on the day of discharge then the following day she started to cough, became more lethargic and this then worsened to the point they brought to the hospital. Family reported to MD they suspected that the nursing home was not giving her thickened liquids like she was supposed to have.   Pertinent Vitals Pain Assessment:  (no evidence pain)  SLP Plan  Continue with current plan of care    Recommendations Diet recommendations: Dysphagia 1 (puree);Nectar-thick liquid Liquids provided via: No straw;Teaspoon Medication Administration: Crushed with puree Supervision: Trained caregiver to feed patient;Full supervision/cueing for compensatory strategies;Staff to assist with self  feeding Compensations: Slow rate;Small sips/bites;Check for pocketing Postural Changes and/or Swallow Maneuvers: Seated upright 90 degrees;Upright 30-60 min after meal              Oral Care Recommendations: Oral care BID Follow up Recommendations:  (TBD) Plan: Continue with current plan of care    GO     Royce MacadamiaLitaker, Nyrie Sigal Willis 08/22/2014, 10:25 AM  Breck CoonsLisa Willis Lonell FaceLitaker M.Ed [employer]-SLP Pager [phone]

## 2014-08-22 NOTE — Progress Notes (Addendum)
Patient ID: [name] [name], female   DOB: [birth year], 80 y.o.   MRN: [id number]  [facility] PROGRESS NOTE  [name] [name] LSL:[id number] DOB: Apr 07, 1924 DOA: 08/20/2014 PCP: Blanchie Serve, MD  Brief narrative: 80 y.o. WF with dementia, TIA, HTN, CHF, A-Fib, C. Difficile, adult failure to thrive, dysphagia who was discharged from the hospital on 10/30. Patient was brought back to the hospital because of shortness of breath and lethargy. Family stated that she was doing okay on the day of discharge then the following day she started to cough, became more lethargic and this then worsened to the point they brought her to the hospital. In the ED evaluation showed extensive bibasilar infiltrates and findings c/w CHF.  Assessment and Plan:   Severe sepsis  - criteria met with tachycardia, fever, RR > 22 bpm, source likely HCAP vs pneumonitis from aspiration  - pt clinically stable this AM but still fairly lethargic, opens eyes and answers yes and no to questions - maintaining oxygen saturation at target range  - pt initially started on Vancomycin, Aztreonam, Primaxin 11/03, ABX stopped 11/05 due to history of C. Diff, ABX  - pt still tachycardic with increased RR and low grade fever 99.7 F - will repeat CXR in AM to ensure no worsening PNA Acute respiratory failure - multifactorial and secondary to bibasilar pneumonia and ? pulmonary edema due to CHF - ABX stopped as noted above - will need repeat CXR in AM - also SLP done with recommendation to change diet to dys I - high risk aspiration - carefull administration of IVF to avoid fluid overload but currently pt not volume overloaded, actually dry on exam  Chronic diastolic CHF - weight stable, trend since admission: 96 lbs --> 94 lbs and stable over the past 24 hours - continue low rate IVF until oral intake improves - 2 D ECHO with normal EF 55% and pulmonary HTN noted, diastolic dysfunction grade I  - daily weights, strict I's and  [facility] associated pneumonia/pneumonitis - worrisome for underlying PNA as noted on initial CXR - will need repeat CXR in AM Severe PCM with underlying dysphagia and underweight with BMI < 18 - dys I diet - IR consult for consideration of tube feeding - appreciate input  Recent C. Difficile colitis  - has hx of recurrent C diff colitis  - was d/c on flagyl 10/30 w/ plan to complete 8 additional days of tx  - no diarrhea at present Chronic Atrial fibrillation w/ acute RVR - not on AC due to high risk of fall  - continue Cardizem drip and transition to PO once pt able to tolerate PO  Pulmonary hypertension - control patient's BP; conservative management  Severe dementia  - Presently alert but confused Hypokalemia  - supplemented and WNL this AM Normocytic anemia  - stable Hypothyroid  - Cont Synthroid IV 100 g daily Functional quadriplegia - PT/OT requested  Goals of care  - appreciate PCT input - DNR order in place   DVT prophylaxis  Heparin SQ while pt is in hospital  Code Status: DNR Family Communication: Daughter at bedside  Disposition Plan: SNF when medically stable   IV Access:   Peripheral IV Procedures and diagnostic studies:    CXR 08/21/2014 No interval change diffuse bilateral airspace disease which may represent pneumonia. Question aspiration.    10/27 positive C.Diff by PCR  11/05 echocardiogram; LVEF=55%- 60%. Aortic valve mild regurgitation. Mitral valve:severe regurgitation. Medical Consultants:   IR  PCT Other Consultants:  PT OT Anti-Infectives:    Primaxin 11/03 > 11/4  Vanc IV 11/03>>stopped 11/5  Aztreonam 11/03>>stopped 11/3  Faye Ramsay, MD  [facility] Pager [phone]  If 7PM-7AM, please contact night-coverage [email or web address] Password [email or web address] 08/22/2014, 5:17 PM   LOS: 3 days   HPI/Subjective: No events overnight.   Objective: Filed Vitals:   08/22/14 1000 08/22/14 1200 08/22/14 1300 08/22/14 1600  BP: 140/97  158/91    Pulse: 96 110    Temp:   98 F (36.7 C) 98.1 F (36.7 C)  TempSrc:   Axillary Axillary  Resp: 23 22    Height:      Weight:      SpO2: 96% 94%      Intake/Output Summary (Last 24 hours) at 08/22/14 1717 Last data filed at 08/22/14 1300  Gross per 24 hour  Intake   2565 ml  Output     75 ml  Net   2490 ml    Exam:   General:  Pt is somnolent but opens eyes when called by name, NAD  Cardiovascular: Irregular rate and rhythm, no rubs, no gallops  Respiratory: Diminished air movement at bases, poor inspiratory effort   Abdomen: Soft, non tender, non distended, bowel sounds present, no guarding  Data Reviewed: Basic Metabolic Panel:  Recent Labs Lab 08/18/2014 1450 08/20/14 0240 08/21/14 0311 08/22/14 0226  NA 143 142 146 147  K 4.0 3.4* 4.3 4.5  CL 108 107 110 110  CO2 $Re'24 24 21 19  'QqG$ GLUCOSE 110* 110* 76 115*  BUN $Re'10 10 15 'gid$ 25*  CREATININE 0.67 0.66 0.74 0.76  CALCIUM 9.6 9.2 9.5 9.8  MG  --   --   --  2.3   Liver Function Tests:  Recent Labs Lab 08/20/14 0240 08/21/14 0311 08/22/14 0226  AST $Re'15 16 18  'ZQq$ ALT $R'14 13 14  'Wb$ ALKPHOS 42 45 50  BILITOT 0.6 0.5 0.5  PROT 5.7* 6.2 6.8  ALBUMIN 2.3* 2.6* 2.9*   CBC:  Recent Labs Lab 09/01/2014 1450 08/20/14 0240 08/21/14 0311  WBC 7.1 10.1 9.7  NEUTROABS 6.2  --   --   HGB 12.9 10.8* 11.4*  HCT 40.2 33.6* 35.6*  MCV 93.5 93.1 92.5  PLT 281 221 282   Recent Results (from the past 240 hour(s))  Culture, blood (routine x 2)     Status: None (Preliminary result)   Collection Time: 09/08/2014  9:20 PM  Result Value Ref Range Status   Specimen Description BLOOD LEFT ANTECUBITAL  Final   Special Requests BOTTLES DRAWN AEROBIC ONLY 10CC  Final   Culture  Setup Time   Final    08/20/2014 01:06 Performed at [facility]    Culture   Final           BLOOD CULTURE RECEIVED NO GROWTH TO DATE CULTURE WILL BE HELD FOR 5 DAYS BEFORE ISSUING A FINAL NEGATIVE REPORT Performed at [facility]     Report Status PENDING  Incomplete  Culture, blood (routine x 2)     Status: None (Preliminary result)   Collection Time: 08/26/2014  9:25 PM  Result Value Ref Range Status   Specimen Description BLOOD LEFT HAND  Final   Special Requests BOTTLES DRAWN AEROBIC ONLY 4CC  Final   Culture  Setup Time   Final    08/20/2014 01:06 Performed at [facility]   Final           BLOOD CULTURE RECEIVED NO GROWTH  TO DATE CULTURE WILL BE HELD FOR 5 DAYS BEFORE ISSUING A FINAL NEGATIVE REPORT Performed at [facility]    Report Status PENDING  Incomplete     Scheduled Meds: . heparin  5,000 Units Subcutaneous 3 times per day  . lacosamide (VIMPAT) IV  50 mg Intravenous Q12H  . levothyroxine  100 mcg Intravenous QAC breakfast  . methylPREDNISolone  60 mg Intravenous Q24H   Continuous Infusions: . sodium chloride 50 mL/hr at 08/21/14 1900  . diltiazem (CARDIZEM) infusion 10 mg/hr (08/22/14 1459)

## 2014-08-22 NOTE — Plan of Care (Signed)
Problem: SLP Dysphagia Goals Goal: Patient will demonstrate readiness for PO's Patient will demonstrate readiness for PO's and/or instrumental swallow study as evidenced by:  Outcome: Completed/Met Date Met:  08/22/14

## 2014-08-23 ENCOUNTER — Inpatient Hospital Stay (HOSPITAL_COMMUNITY): Payer: PRIVATE HEALTH INSURANCE

## 2014-08-23 LAB — BASIC METABOLIC PANEL
Anion gap: 10 (ref 5–15)
BUN: 29 mg/dL — AB (ref 6–23)
CALCIUM: 9.5 mg/dL (ref 8.4–10.5)
CO2: 23 mEq/L (ref 19–32)
CREATININE: 0.81 mg/dL (ref 0.50–1.10)
Chloride: 114 mEq/L — ABNORMAL HIGH (ref 96–112)
GFR calc Af Amer: 73 mL/min — ABNORMAL LOW (ref >90)
GFR, EST NON AFRICAN AMERICAN: 63 mL/min — AB (ref >90)
GLUCOSE: 157 mg/dL — AB (ref 70–99)
Potassium: 4.5 mEq/L (ref 3.7–5.3)
Sodium: 147 mEq/L (ref 137–147)

## 2014-08-23 LAB — APTT: aPTT: 28 seconds (ref 24–37)

## 2014-08-23 LAB — STREP PNEUMONIAE URINARY ANTIGEN: STREP PNEUMO URINARY ANTIGEN: NEGATIVE

## 2014-08-23 LAB — PROTIME-INR
INR: 1.16 (ref 0.00–1.49)
Prothrombin Time: 14.9 seconds (ref 11.6–15.2)

## 2014-08-23 LAB — PRO B NATRIURETIC PEPTIDE: Pro B Natriuretic peptide (BNP): 5482 pg/mL — ABNORMAL HIGH (ref 0–450)

## 2014-08-23 MED ORDER — RESOURCE THICKENUP CLEAR PO POWD
ORAL | Status: DC | PRN
  Filled 2014-08-23: qty 125

## 2014-08-23 MED ORDER — DEXTROSE 5 % IV SOLN
2.0000 g | Freq: Three times a day (TID) | INTRAVENOUS | Status: DC
  Administered 2014-08-23 – 2014-08-25 (×7): 2 g via INTRAVENOUS
  Filled 2014-08-23 (×10): qty 2

## 2014-08-23 MED ORDER — VANCOMYCIN HCL IN DEXTROSE 750-5 MG/150ML-% IV SOLN
750.0000 mg | INTRAVENOUS | Status: DC
  Administered 2014-08-23 – 2014-08-24 (×2): 750 mg via INTRAVENOUS
  Filled 2014-08-23 (×3): qty 150

## 2014-08-23 MED ORDER — METHYLPREDNISOLONE SODIUM SUCC 40 MG IJ SOLR
40.0000 mg | INTRAMUSCULAR | Status: DC
  Administered 2014-08-24: 40 mg via INTRAVENOUS
  Filled 2014-08-23: qty 1

## 2014-08-23 NOTE — Progress Notes (Signed)
Called to pt room due to possible aspiration and O2 sats in 70.  Suctioned pt and got small amt of thick brownish sec.  Sats improving to 92   Gave pt duoneb. And suctioned again.

## 2014-08-23 NOTE — Progress Notes (Signed)
PT Cancellation Note  Patient Details Name: [name] [name] MRN: [id number] DOB: [birth year]   Cancelled Treatment:    Reason Eval/Treat Not Completed: Other (comment). Pt bed bound at SNF. Pt recently on caseload and D/C on 08/15/14 back to SNF. Pt not making progress with therapy. Pt is appropriate for lift OOB by nursing. Encouraged RN to perform ROM of extremities and discussed ordering PRAFOs for heel cord tightness. Pt may benefit from OT consult in regards to newly contractured UEs. Will sign off at this time.    Donnamarie [place], [name] [name] Rock MillsN, [place]PT  [phone] 08/23/2014, 12:17 PM

## 2014-08-23 NOTE — Progress Notes (Signed)
ANTIBIOTIC CONSULT NOTE - INITIAL  Pharmacy Consult for Vancomycin Indication: HCAP  Allergies  Allergen Reactions  . Penicillins     "red [facility]" [facility]    Patient Measurements: Height: 5\' 2"  (157.5 cm) Weight: 95 lb 14.4 oz (43.5 kg) IBW/kg (Calculated) : 50.1  Vital Signs: Temp: 98.3 F (36.8 C) (11/07 0404) Temp Source: Oral (11/07 0404) BP: 130/60 mmHg (11/07 0800) Pulse Rate: 92 (11/07 0800) Intake/Output from previous day: 11/06 0701 - 11/07 0700 In: 920 [I.V.:860; IV Piggyback:60] Out: 325 [Urine:325] Intake/Output from this shift: Total I/O In: 220 [I.V.:220] Out: -   Labs:  Recent Labs  08/21/14 0311 08/22/14 0226 08/23/14 0250  WBC 9.7  --   --   HGB 11.4*  --   --   PLT 282  --   --   CREATININE 0.74 0.76 0.81   Estimated Creatinine Clearance: 33 mL/min (by C-G formula based on Cr of 0.81). No results for input(s): VANCOTROUGH, VANCOPEAK, VANCORANDOM, GENTTROUGH, GENTPEAK, GENTRANDOM, TOBRATROUGH, TOBRAPEAK, TOBRARND, AMIKACINPEAK, AMIKACINTROU, AMIKACIN in the last 72 hours.   Microbiology: Recent Results (from the past 720 hour(s))  Blood culture (routine x 2)     Status: None   Collection Time: 08/11/14 12:51 PM  Result Value Ref Range Status   Specimen Description BLOOD RIGHT FOREARM  Final   Special Requests BOTTLES DRAWN AEROBIC AND ANAEROBIC 4CC  Final   Culture  Setup Time   Final    08/11/2014 18:16 [facility]formed at [facility]    Culture   Final    NO GROWTH 5 DAYS Performed at [facility]    Report Status 08/17/2014 FINAL  Final  Blood culture (routine x 2)     Status: None   Collection Time: 08/11/14  1:20 PM  Result Value Ref Range Status   Specimen Description BLOOD LEFT HAND  Final   Special Requests BOTTLES DRAWN AEROBIC AND ANAEROBIC 5CC  Final   Culture  Setup Time   Final    08/11/2014 18:16 Performed at [facility]    Culture   Final    NO GROWTH 5 DAYS [facility]formed at [facility]    Report Status 08/17/2014 FINAL  Final  Urine culture     Status: None   Collection Time: 08/11/14  2:53 PM  Result Value Ref Range Status   Specimen Description URINE, CATHETERIZED  Final   Special Requests NONE  Final   Culture  Setup Time   Final    08/10/2014 16:35 Performed at [facility]   Colony Count NO GROWTH [facility]formed at [facility]  Final   Culture NO GROWTH Performed at [facility]  Final   Report Status 08/12/2014 FINAL  Final  MRSA PCR Screening     Status: None   Collection Time: 08/12/14  5:28 AM  Result Value Ref Range Status   MRSA by PCR NEGATIVE NEGATIVE Final    Comment:        The GeneXpert MRSA Assay (FDA approved for NASAL specimens only), is one component of a comprehensive MRSA colonization surveillance program. It is not intended to diagnose MRSA infection nor to guide or monitor treatment for MRSA infections.  Clostridium Difficile by PCR     Status: Abnormal   Collection Time: 08/12/14  8:34 AM  Result Value Ref Range Status   C difficile by pcr POSITIVE (A) NEGATIVE Final    Comment: CRITICAL RESULT CALLED TO, READ BACK BY AND VERIFIED  WITH: Quita SkyeV. JOHNSON RN 10:50 08/12/14 ([email or web address])  Culture, blood (routine x 2)     Status: None (Preliminary result)   Collection Time: 2013-12-12  9:20 PM  Result Value Ref Range Status   Specimen Description BLOOD LEFT ANTECUBITAL  Final   Special Requests BOTTLES DRAWN AEROBIC ONLY 10CC  Final   Culture  Setup Time   Final    08/20/2014 01:06 Performed at [facility]    Culture   Final           BLOOD CULTURE RECEIVED NO GROWTH TO DATE CULTURE WILL BE HELD FOR 5 DAYS BEFORE ISSUING A FINAL NEGATIVE REPORT Performed at [facility]    Report Status PENDING  Incomplete  Culture, blood (routine x 2)     Status: None (Preliminary result)   Collection Time: 2013-12-12  9:25 PM  Result Value Ref Range Status   Specimen Description BLOOD LEFT HAND  Final    Special Requests BOTTLES DRAWN AEROBIC ONLY 4CC  Final   Culture  Setup Time   Final    08/20/2014 01:06 Performed at [facility]    Culture   Final           BLOOD CULTURE RECEIVED NO GROWTH TO DATE CULTURE WILL BE HELD FOR 5 DAYS BEFORE ISSUING A FINAL NEGATIVE REPORT Performed at [facility]    Report Status PENDING  Incomplete    Medical History: Past Medical History  Diagnosis Date  . Renal insufficiency   . Hypertension   . A-fib   . Anxiety   . Depression   . Hypothyroidism   . Inguinal hernia     repaired 2011, right  . Osteoarthritis   . Cerebral vascular accident   . DJD (degenerative joint disease)   . Recurrent UTI   . C. difficile colitis     recurrent  . Dementia   . Anemia   . Cataract   . Asteatotic eczema   . Peripheral vascular disease   . Hyponatremia   . Malnutrition   . Sepsis(995.91)   . TIA (transient ischemic attack)   . Urinary incontinence   . Congestive heart failure, unspecified 05/04/2014    Medications:  Scheduled:  . antiseptic oral rinse  7 mL Mouth Rinse q12n4p  . aztreonam  2 g Intravenous 3 times per day  . chlorhexidine  15 mL Mouth Rinse BID  . heparin  5,000 Units Subcutaneous 3 times per day  . lacosamide (VIMPAT) IV  50 mg Intravenous Q12H  . levothyroxine  100 mcg Intravenous QAC breakfast  . methylPREDNISolone (SOLU-MEDROL) injection  60 mg Intravenous Q24H  . sodium chloride  3 mL Intravenous Q12H  . vancomycin  750 mg Intravenous Q24H   Assessment: 80 yo f being restarted on vancomycin for HCAP (previously on vanc 11/3-11/4). Pt is also on aztreonam (penicillin allergy). Patient has a history of recent pneumonia, c diff infection, and recurrent aspiration. Patient weighs 43.5 kg and has Scr 0.81 (CrCl ~33 ml/min).  Will start vanc 750 mg IV q24hrs. Wbc wnl, afeb. Cx neg to date  Vanc 11/3 >> 11/4, 11/7 >> Primax 11/3 >>11/4 Aztreonam 11/7 >> (11/15)  11/3 BC >> ngtd  Goal of Therapy:   Vancomycin trough level 15-20 mcg/ml  Plan:  -Start vanc IV 750mg  q24h -Continue Aztreonam 2g IV q8h -Monitor c/s, renal function, clinical progress, abx de-esc plans  Babs BertinHaley Janalynn Eder, PharmD Clinical Pharmacist - Resident Pager [phone] 08/23/2014 10:15 AM

## 2014-08-23 NOTE — Progress Notes (Signed)
Patient ID: [name] [name], female   DOB: 03-Jul-1924, 80 y.o.   MRN: [id number]   [facility] PROGRESS NOTE  [name] [name] WUJ:[id number] DOB: 06/08/1924 DOA: 08/28/2014 PCP: Blanchie Serve, MD   Brief narrative: 80 y.o. WF with dementia, TIA, HTN, CHF, A-Fib, C. Difficile, adult failure to thrive, dysphagia who was discharged from the hospital on 10/30. Patient was brought back to the hospital because of shortness of breath and lethargy. Family stated that she was doing okay on the day of discharge then the following day she started to cough, became more lethargic and this then worsened to the point they brought her to the hospital. In the ED evaluation showed extensive bibasilar infiltrates and findings c/w CHF.  Assessment and Plan:   Severe sepsis  - criteria met with tachycardia, fever, RR > 22 bpm, source likely HCAP vs pneumonitis from aspiration  - pt clinically stable this AM but still weak and somnolent, opens eyes and answers yes and no to questions - maintaining oxygen saturation at target range  - pt initially started on Vancomycin, Aztreonam, Primaxin 11/03, ABX stopped 11/05 due to history of C. Diff, ABX  - pt still tachycardic with increased RR and low grade fever 99.7 F as off 11/06 - repeat CXR with persistent PNA, will therefore start ABX for HCAP coverage 11/07 - please note that pt has not had any diarrhea since admission  Acute respiratory failure - multifactorial and secondary to bibasilar pneumonia and ? pulmonary edema due to CHF - CXR with persistent PNA as noted above  - also SLP done with recommendation to change diet to dys I - high risk aspiration - ABX started as noted above  Chronic diastolic CHF - weight stable, trend since admission: 96 lbs --> 94 lbs --> 95 lbs this AM - continue low rate IVF until oral intake improves - 2 D ECHO with normal EF 55% and pulmonary HTN noted, diastolic dysfunction grade I  - CXR indicating resolving  pulmonary edema  - daily weights, strict I's and O[facility] associated pneumonia/pneumonitis - worrisome for underlying PNA as noted on initial CXR and now on repeat CXR  - ABX as noted above  - provide oxygen as needed  Severe PCM with underlying dysphagia and underweight with BMI < 18 - dys I diet - IR consulted for consideration of tube feeding but will hold off on this for now as pt is currently eating and tolerating dys I diet well  - appreciate input  Recent C. Difficile colitis  - has hx of recurrent C diff colitis  - was d/c on flagyl 10/30 w/ plan to complete 8 additional days of tx  - no diarrhea at present Chronic Atrial fibrillation w/ acute RVR - not on AC due to high risk of fall  - continue Cardizem drip and transition to PO in 24 hours  Pulmonary hypertension - control patient's BP; conservative management  Severe dementia  - Presently alert but confused - PT/OT evaluation  Hypokalemia  - supplemented and WNL this AM Normocytic anemia  - stable, repeat CBC in AM Hypothyroid  - Cont Synthroid IV 100 g daily Functional quadriplegia - PT/OT requested  Goals of care  - appreciate PCT input - DNR order in place   DVT prophylaxis  Heparin SQ while pt is in hospital  Code Status: DNR Family Communication: Caregiver at bedside  Disposition Plan: SNF when medically stable   IV Access:    Peripheral IV Procedures and diagnostic studies:  CXR 08/21/2014 No interval change diffuse bilateral airspace disease which may represent pneumonia. Question aspiration.   10/27 positive C.Diff by PCR  11/05 ECHO; LVEF=55%- 60%. Aortic valve mild regurgitation. Mitral valve:severe regurgitation. CXR 08/23/2014 Persistent right perihilar pneumonia.  Pulmonary edema noted at admission has gradually improved. 3Unchanged left more than right pleural effusion.   Medical Consultants:    IR   PCT Other Consultants:    PT  OT Anti-Infectives:     Primaxin 11/03 > 11/4  Vanc IV 11/03>>stopped 11/5; restarted 11/07  Aztreonam 11/03>>stopped 11/3; restarted 11/07  Faye Ramsay, MD  [facility] Pager [phone]  If 7PM-7AM, please contact night-coverage [email or web address] Password [email or web address] 08/23/2014, 10:33 AM   LOS: 4 days   HPI/Subjective: No events overnight.   Objective: Filed Vitals:   08/22/14 1947 08/22/14 2341 08/23/14 0404 08/23/14 0800  BP: 138/65 125/72 125/68 130/60  Pulse: 93 97 89 92  Temp: 97.6 F (36.4 C) 97.8 F (36.6 C) 98.3 F (36.8 C)   TempSrc: Oral Oral Oral   Resp: _0 Height:      Weight:   43.5 kg (95 lb 14.4 oz)   SpO2: 95% 98% 97% 97%    Intake/Output Summary (Last 24 hours) at 08/23/14 1033 Last data filed at 08/23/14 0900  Gross per 24 hour  Intake    960 ml  Output    325 ml  Net    635 ml    Exam:   General:  Pt is alert, follows commands appropriately, not in acute distress  Cardiovascular: Irregular rate and rhythm, no rubs, no gallops  Respiratory: Clear to auscultation bilaterally, rhonchi at bases   Abdomen: Soft, non tender, non distended, bowel sounds present, no guarding  Extremities: pulses DP and PT palpable bilaterally  Neuro: Grossly nonfocal  Data Reviewed: Basic Metabolic Panel:  Recent Labs Lab 09/11/2014 1450 08/20/14 0240 08/21/14 0311 08/22/14 0226 08/23/14 0250  NA 143 142 146 147 147  K 4.0 3.4* 4.3 4.5 4.5  CL 108 107 110 110 114*  CO2 _1 GLUCOSE 110* 110* 76 115* 157*  BUN _2 25* 29*  CREATININE 0.67 0.66 0.74 0.76 0.81  CALCIUM 9.6 9.2 9.5 9.8 9.5  MG  --   --   --  2.3  --    Liver Function Tests:  Recent Labs Lab 08/20/14 0240 08/21/14 0311 08/22/14 0226  AST _3 ALT _4 ALKPHOS 42 45 50  BILITOT 0.6 0.5 0.5  PROT 5.7* 6.2 6.8  ALBUMIN 2.3* 2.6* 2.9*   CBC:  Recent Labs Lab 08/20/2014 1450 08/20/14 0240 08/21/14 0311  WBC 7.1 10.1 9.7  NEUTROABS 6.2  --   --   HGB 12.9 10.8* 11.4*   HCT 40.2 33.6* 35.6*  MCV 93.5 93.1 92.5  PLT 281 221 282    Recent Results (from the past 240 hour(s))  Culture, blood (routine x 2)     Status: None (Preliminary result)   Collection Time: 09/04/2014  9:20 PM  Result Value Ref Range Status   Specimen Description BLOOD LEFT ANTECUBITAL  Final   Special Requests BOTTLES DRAWN AEROBIC ONLY 10CC  Final   Culture  Setup Time   Final    08/20/2014 01:06 Performed at [place]   Final           BLOOD CULTURE RECEIVED NO GROWTH TO DATE CULTURE WILL BE HELD FOR  5 DAYS BEFORE ISSUING A FINAL NEGATIVE REPORT Performed at [facility]    Report Status PENDING  Incomplete  Culture, blood (routine x 2)     Status: None (Preliminary result)   Collection Time: 08/20/2014  9:25 PM  Result Value Ref Range Status   Specimen Description BLOOD LEFT HAND  Final   Special Requests BOTTLES DRAWN AEROBIC ONLY 4CC  Final   Culture  Setup Time   Final    08/20/2014 01:06 Performed at [facility]    Culture   Final           BLOOD CULTURE RECEIVED NO GROWTH TO DATE CULTURE WILL BE HELD FOR 5 DAYS BEFORE ISSUING A FINAL NEGATIVE REPORT Performed at [facility]    Report Status PENDING  Incomplete     Scheduled Meds: . aztreonam  2 g Intravenous 3 times per day  . heparin  5,000 Units Subcutaneous 3 times per day  . lacosamide (VIMPAT) IV  50 mg Intravenous Q12H  . levothyroxine  100 mcg Intravenous QAC breakfast  . methylPREDNISolone (SOLU-MEDROL) injection  60 mg Intravenous Q24H  . vancomycin  750 mg Intravenous Q24H   Continuous Infusions: . sodium chloride 50 mL/hr at 08/22/14 1930  . diltiazem (CARDIZEM) infusion 10 mg/hr (08/23/14 0900)

## 2014-08-23 NOTE — Progress Notes (Addendum)
Paged Dr. Izola PriceMyers at [phone] to inform her of pts aspirating. Pt aspirated tomato juice. Sats dropped in the 40%. Placed pt on a nonrebreather and NTS'd her. Sats came back up to 88%. RT at bedside giving pt a breathing tx. Sats now at 91%. Awaiting call back from MD. Will continue to monitor pt.   VS reviewed, pt stable at this time. Pt already started on broad spectrum ABX for HCAP. Continue aspiration precautions,. Will need to reconsider IR consult for tube placement.   Debbora PrestoMAGICK-Jerald Hennington, MD  [facility] Pager [phone]  If 7PM-7AM, please contact night-coverage [email or web address] Password [email or web address]

## 2014-08-23 NOTE — Progress Notes (Signed)
Patient ID: [name] [name], female   DOB: June 03, 1924, 80 y.o.   MRN: [id number]    To room today to prepare pt for G tube Mon 11/9  Sitter reports MD is to cancel G tube request---pt is eating Spoke to RN---not completely confirmed  Will check chart in am Please cancel request if do NOT want percutaneous G tube

## 2014-08-24 LAB — BASIC METABOLIC PANEL
ANION GAP: 11 (ref 5–15)
BUN: 24 mg/dL — AB (ref 6–23)
CO2: 20 mEq/L (ref 19–32)
CREATININE: 0.66 mg/dL (ref 0.50–1.10)
Calcium: 9.3 mg/dL (ref 8.4–10.5)
Chloride: 116 mEq/L — ABNORMAL HIGH (ref 96–112)
GFR calc non Af Amer: 77 mL/min — ABNORMAL LOW (ref >90)
GFR, EST AFRICAN AMERICAN: 89 mL/min — AB (ref >90)
Glucose, Bld: 141 mg/dL — ABNORMAL HIGH (ref 70–99)
Potassium: 4.5 mEq/L (ref 3.7–5.3)
Sodium: 147 mEq/L (ref 137–147)

## 2014-08-24 LAB — PROCALCITONIN: PROCALCITONIN: 0.68 ng/mL

## 2014-08-24 NOTE — Progress Notes (Addendum)
Palliative Medicine Team  Patient screened and brief visit with family made on 11/6. Ms. 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 I am familiar with her case and the family dynamics. I stopped by to provide support to [name] and Mark, Ms. [name]'s son and daughter, who have struggled not only with their mother's decline but in letting go and accepting that she is dying. They understand that she will never get much better than her current bed bound state, and that she is likely approaching end of life, but they are also holding out hope that she may improve enough to live a little bit longer. [name] and [name] have extreme anxiety surrounding her death and the impending grief is overwhelming for them.They will need significant support in a comfort care transition. I have agreed to meet with them Monday morning to address comfort care directly- they wanted to give aggressive treatment a chance through the weekend and if not improved talk about hospice.  Please contact PMT if her condition changes or we need to meet before Monday.  [name], DO Palliative Medicine [phone]

## 2014-08-24 NOTE — Progress Notes (Signed)
Patient ID: [name] [name], female   DOB: 1924-03-01, 80 y.o.   MRN: [id number] [facility] PROGRESS NOTE  [name] [name] NKN:[id number] DOB: [birth year] DOA: 09/03/2014 PCP: Blanchie Serve, MD  Brief narrative: 80 year old female with dementia, TIA, HTN, CHF, A-Fib, C. Difficile, adult failure to thrive, dysphagia, recently discharged from the hospital on 10/30. Patient was brought back to the hospital because of shortness of breath and lethargy. Family stated that she was doing okay on the day of the discharge then the following day she started to cough, became more lethargic and this then worsened to the point they brought her to the hospital. In the ED, evaluation showed extensive bibasilar infiltrates and findings c/w CHF.  Assessment/Plan:    Severe sepsis  - sepsis criteria met with tachycardia, fever, RR > 22 bpm, source likely HCAP and possible aspiration pneumonitis  - on 08/23/2014, RN reported pt aspirated on tomato juice.CXR obtained and showed persistent right perihilar pneumonia; pulmonary edema has improved - continue current abx regimen, aztreonam and vancomycin  - continue to monitor respiratory status - pt somnolent this morning  Acute respiratory failure with hypoxia / HCAP / Aspiration pneumonitis  - multifactorial likely due to combination of HCAP, aspiration pneumonia. Pulmonary edema resolving based on CXR findings.  - family inquired about TF. Family not at the bedside this am.  - appreciate palliative care team addressing goals of care. We will have to talk to the family if there is truly a benefit to having TF as TF will not really prevent aspiration events and may even worsen it since pt somnolent, demented.  - continue aspiration precautions.  Chronic diastolic CHF - noted upward weight trend since admission: 96 lbs --> 94 lbs --> 95 --> 102 lbs.  - Clinically, pt appears to be volume depleted but the presence of chronic diastolic CHF precludes aggressive  intravenous fluids - current IV fluids at 30 cc/hr - 2 D ECHO with normal EF 55% and pulmonary HTN noted, diastolic dysfunction grade I  - continue daily weight and strict intake and putput - replete electrolytes as needed.   Severe PCM with underlying dysphagia and underweight with BMI < 18 - NPO, aspiration precaution - SLP evaluation order placed. If family still wants TF, will need to discuss risk vs benefit Recent C. Difficile colitis  - has hx of recurrent C diff colitis  - was d/c on flagyl 10/30 w/ plan to complete 8 additional days of tx  - no diarrhea at present Chronic Atrial fibrillation w/ acute RVR - not on AC due to high risk of fall  - continue Cardizem drip (minimize PO meds due to risk of aspiration) Pulmonary hypertension - control patient's BP; conservative management  History of seizures - continue vimpat  Severe dementia  - no significant changes in mental status.  - PT/OT evaluation  Hypokalemia  - supplemented and WNL this AM Normocytic anemia  - stable, repeat CBC in AM Hypothyroid  - Cont Synthroid IV 100 g daily Functional quadriplegia - PT/OT requested   DVT prophylaxis  Heparin SQ while pt is in hospital  Code Status: DNR/ DNI Family Communication: Caregiver at bedside  Disposition Plan: SNF when medically stable    IV access:   PeripheralIV Procedures and diagnostic studies:     CXR 08/21/2014 No interval change diffuse bilateral airspace disease which may represent pneumonia. Question aspiration.   10/27 positive C.Diff by PCR  11/05 ECHO; LVEF=55%- 60%. Aortic valve mild regurgitation. Mitral valve:severe regurgitation.  CXR 08/23/2014 Persistent right perihilar pneumonia. Pulmonary edema noted at admission has gradually improved. 3Unchanged left more than right pleural effusion.  Medical Consultants:   PCT Other Consultants:   PT /OT  SLP IAnti-Infectives:    Primaxin 11/03 > 11/4  Vanc IV 11/03>>stopped  11/5; restarted 11/07 -->   Aztreonam 11/03>>stopped 11/3; restarted 11/07 -->    [name] Lenz, MD  [facility] Pager [phone]  If 7PM-7AM, please contact night-coverage [email or web address] Password [facility] 08/24/2014, 6:25 PM   LOS: 5 days    HPI/Subjective: No acute overnight events.  Objective: Filed Vitals:   08/24/14 0400 08/24/14 0500 08/24/14 0800 08/24/14 1543  BP: 146/89  154/92   Pulse: 99  103   Temp: 99.8 F (37.7 C)  99.6 F (37.6 C) 99 F (37.2 C)  TempSrc: Axillary  Axillary Axillary  Resp: 26  28   Height:      Weight:  46.3 kg (102 lb 1.2 oz)    SpO2: 96%  94%     Intake/Output Summary (Last 24 hours) at 08/24/14 1825 Last data filed at 08/24/14 0800  Gross per 24 hour  Intake    140 ml  Output    425 ml  Net   -285 ml    Exam:   General:  Pt is somnolent, no distress  Cardiovascular: tachycardic, S1/S2 appreciated   Respiratory: coarse breath sounds, no wheezing   Abdomen: Soft, non distended, bowel sounds present  Extremities: Pulses DP and PT palpable bilaterally  Neuro: Non focal   Data Reviewed: Basic Metabolic Panel:  Recent Labs Lab 08/20/14 0240 08/21/14 0311 08/22/14 0226 08/23/14 0250 08/24/14 0308  NA 142 146 147 147 147  K 3.4* 4.3 4.5 4.5 4.5  CL 107 110 110 114* 116*  CO2 _0 GLUCOSE 110* 76 115* 157* 141*  BUN 10 15 25* 29* 24*  CREATININE 0.66 0.74 0.76 0.81 0.66  CALCIUM 9.2 9.5 9.8 9.5 9.3  MG  --   --  2.3  --   --    Liver Function Tests:  Recent Labs Lab 08/20/14 0240 08/21/14 0311 08/22/14 0226  AST _1 ALT _2 ALKPHOS 42 45 50  BILITOT 0.6 0.5 0.5  PROT 5.7* 6.2 6.8  ALBUMIN 2.3* 2.6* 2.9*   No results for input(s): LIPASE, AMYLASE in the last 168 hours. No results for input(s): AMMONIA in the last 168 hours. CBC:  Recent Labs Lab 08/24/2014 1450 08/20/14 0240 08/21/14 0311  WBC 7.1 10.1 9.7  NEUTROABS 6.2  --   --   HGB 12.9 10.8* 11.4*  HCT 40.2 33.6*  35.6*  MCV 93.5 93.1 92.5  PLT 281 221 282   Cardiac Enzymes: No results for input(s): CKTOTAL, CKMB, CKMBINDEX, TROPONINI in the last 168 hours. BNP: Invalid input(s): POCBNP CBG: No results for input(s): GLUCAP in the last 168 hours.  Recent Results (from the past 240 hour(s))  Culture, blood (routine x 2)     Status: None (Preliminary result)   Collection Time: 08/30/2014  9:20 PM  Result Value Ref Range Status   Specimen Description BLOOD LEFT ANTECUBITAL  Final   Special Requests BOTTLES DRAWN AEROBIC ONLY 10CC  Final   Culture  Setup Time   Final    08/20/2014 01:06 Performed at [facility]    Culture   Final           BLOOD CULTURE RECEIVED NO GROWTH TO DATE CULTURE WILL  BE HELD FOR 5 DAYS BEFORE ISSUING A FINAL NEGATIVE REPORT Performed at [facility]    Report Status PENDING  Incomplete  Culture, blood (routine x 2)     Status: None (Preliminary result)   Collection Time: 08/26/2014  9:25 PM  Result Value Ref Range Status   Specimen Description BLOOD LEFT HAND  Final   Special Requests BOTTLES DRAWN AEROBIC ONLY 4CC  Final   Culture  Setup Time   Final    08/20/2014 01:06 Performed at [facility]    Culture   Final           BLOOD CULTURE RECEIVED NO GROWTH TO DATE CULTURE WILL BE HELD FOR 5 DAYS BEFORE ISSUING A FINAL NEGATIVE REPORT Performed at [facility]    Report Status PENDING  Incomplete     Scheduled Meds: . antiseptic oral rinse  7 mL Mouth Rinse q12n4p  . aztreonam  2 g Intravenous 3 times per day  . chlorhexidine  15 mL Mouth Rinse BID  . heparin  5,000 Units Subcutaneous 3 times per day  . lacosamide (VIMPAT) IV  50 mg Intravenous Q12H  . levothyroxine  100 mcg Intravenous QAC breakfast  . methylPREDNISolone (SOLU-MEDROL) injection  40 mg Intravenous Q24H  . sodium chloride  3 mL Intravenous Q12H  . vancomycin  750 mg Intravenous Q24H   Continuous Infusions: . sodium chloride 30 mL/hr at 08/23/14 1104  .  diltiazem (CARDIZEM) infusion 15 mg/hr (08/24/14 0900)

## 2014-08-25 DIAGNOSIS — I4891 Unspecified atrial fibrillation: Secondary | ICD-10-CM

## 2014-08-25 DIAGNOSIS — E038 Other specified hypothyroidism: Secondary | ICD-10-CM

## 2014-08-26 LAB — LEGIONELLA ANTIGEN, URINE

## 2014-08-26 LAB — CULTURE, BLOOD (ROUTINE X 2)
CULTURE: NO GROWTH
Culture: NO GROWTH

## 2014-08-29 LAB — CULTURE, BLOOD (ROUTINE X 2)
Culture: NO GROWTH
Culture: NO GROWTH

## 2014-09-16 NOTE — Progress Notes (Signed)
Patient ID: [name] [name], female   DOB: 1924-09-30, 80 y.o.   MRN: [id number]   Perc G tube had been requested Fri 11/6 Went to see pt 11/7 and was told was on hold---pt was eating Note that day from MD did confirm this  Now palliative meeting and further discussion with family is to take place  I will discontinue order for G tube Please re order if want to move forward at later date  IR [facility]

## 2014-09-16 NOTE — Discharge Summary (Signed)
Death Summary  [name] [name] ZOX:[id number] DOB: 02-02-1924 DOA: 09/15/14  PCP: Blanchie Serve, MD   Admit date: 09/15/14 Date of Death: 09-29-14  Final Diagnoses:  Principal Problem:   Acute respiratory failure with hypoxia Active Problems:   Hypothyroidism   Atrial fibrillation   Dysphagia   Severe protein-calorie malnutrition   HCAP (healthcare-associated pneumonia)   Acute CHF   Sepsis   Pneumonitis due to inhalation of food or vomitus   Blood poisoning   C. difficile colitis   Chronic atrial fibrillation   Pulmonary hypertension   Dysphagia, pharyngoesophageal phase   Severe dementia   Thyroid activity decreased  History of present illness:  80 year old female with dementia, TIA, HTN, CHF, A-Fib, C. Difficile, adult failure to thrive, dysphagia, recently discharged from the hospital on 10/30. Patient was brought back to the hospital because of shortness of breath and lethargy. Family stated that she was doing okay on the day of the discharge then the following day she started to cough, became more lethargic and this then worsened to the point they brought her to the hospital. In the ED, evaluation showed extensive bibasilar infiltrates and findings c/w CHF. Pt expired 21-Sep-2014. Unknown time; time not charted; i was not physically present when pt expired.    Assessment/Plan:    Severe sepsis  - sepsis criteria met with tachycardia, fever, RR > 22 bpm, source likely HCAP and possible aspiration pneumonitis  - on 08/23/2014, RN reported pt aspirated on tomato juice.CXR obtained and showed persistent right perihilar pneumonia; pulmonary edema has improved - pt was on aztreonam and vancomycin   Acute respiratory failure with hypoxia / HCAP / Aspiration pneumonitis  - multifactorial likely due to combination of HCAP, aspiration pneumonia. Pulmonary edema resolving based on CXR findings.  - family inquired about TF. Family was not at the bedside 08/24/2014 -  palliative addressing goals of care - aspiration precautions  Chronic diastolic CHF - noted upward weight trend since admission: 96 lbs --> 94 lbs --> 95 --> 102 lbs.  - Clinically, pt appears to be volume depleted but the presence of chronic diastolic CHF precludes aggressive intravenous fluids -  IV fluids at 30 cc/hr  - 2 D ECHO with normal EF 55% and pulmonary HTN noted, diastolic dysfunction grade I  -  Severe PCM with underlying dysphagia and underweight with BMI < 18 - NPO, aspiration precaution - SLP evaluation ordered Recent C. Difficile colitis  - has hx of recurrent C diff colitis  - was d/c on flagyl 10/30 w/ plan to complete 8 additional days of tx  - no diarrhea during hospitalization  Chronic Atrial fibrillation w/ acute RVR - not on AC due to high risk of fall  - was on Cardizem drip (minimized PO meds due to risk of aspiration) Pulmonary hypertension - conservative management  History of seizures - was on vimpat  Severe dementia  - no significant changes in mental status.  Hypokalemia  - supplemented and WNL Normocytic anemia  - stable Hypothyroid  - was on Synthroid IV 100 g daily Functional quadriplegia - PT/OT requested but pt somnolent and not able to participate   DVT prophylaxis  Heparin SQ while pt is in hospital  Code Status: DNR/ DNI   IV access:   PeripheralIV Procedures and diagnostic studies:    CXR 08/21/2014 No interval change diffuse bilateral airspace disease which may represent pneumonia. Question aspiration.   10/27 positive C.Diff by PCR  11/05 ECHO; LVEF=55%- 60%. Aortic valve mild  regurgitation. Mitral valve:severe regurgitation.  CXR 08/23/2014 Persistent right perihilar pneumonia. Pulmonary edema noted at admission has gradually improved. 3Unchanged left more than right pleural effusion.  Medical Consultants:   PCT Other Consultants:   PT /OT  SLP IAnti-Infectives:    Primaxin 11/03 >  11/4  Vanc IV 11/03>>stopped 11/5; restarted 11/07 -->   Aztreonam 11/03>>stopped 11/3; restarted 11/07 -->   Time: unknown time; Sep 22, 2014    Signed:  Leisa Lenz  [facility] 09/02/2014, 6:10 PM

## 2014-09-16 NOTE — Care Management Note (Signed)
    Page 1 of 1   09/09/2014     11:12:38 AM CARE MANAGEMENT NOTE 09/06/2014  Patient:  [name],Joyanna   Account Number:  [id number]  Date Initiated:  08/20/2014  Documentation initiated by:  McNally,Kristin  Subjective/Objective Assessment:   pt admit pna/chf requiring bi-pap on admit.     Action/Plan:   PTA [facility], anticipate return when medically stable. SW consulted   Anticipated DC Date:  08/23/2014   Anticipated DC Plan:  SKILLED NURSING FACILITY  In-house referral  Clinical Social Worker         Choice offered to / List presented to:             Status of service:  Completed, signed off Medicare Important Message given?   (If response is "NO", the following Medicare IM given date fields will be blank) Date Medicare IM given:   Medicare IM given by:   Date Additional Medicare IM given:   Additional Medicare IM given by:    Discharge Disposition:  EXPIRED  Per UR Regulation:  Reviewed for med. necessity/level of care/duration of stay  If discussed at Long Length of Stay Meetings, dates discussed:    Comments:  09/05/2014- 1100- Donn PieriniKristi Karder Goodin RN, BSN [phone] Pt expired this am at [phone]

## 2014-09-16 DEATH — deceased
